# Patient Record
Sex: Female | Born: 1985 | Race: Black or African American | Hispanic: No | Marital: Single | Smoking: Never smoker
Health system: Southern US, Community
[De-identification: ages and names within clinical notes are randomized; demographics above are authoritative.]

## PROBLEM LIST (undated history)

## (undated) ENCOUNTER — Emergency Department (HOSPITAL_COMMUNITY): Admission: EM | Disposition: A | Discharge status: Home / Self Care | Payer: Self-pay

## (undated) DIAGNOSIS — H9319 Tinnitus, unspecified ear: Secondary | ICD-10-CM

## (undated) DIAGNOSIS — M25561 Pain in right knee: Secondary | ICD-10-CM

## (undated) DIAGNOSIS — R519 Headache, unspecified: Secondary | ICD-10-CM

## (undated) DIAGNOSIS — R51 Headache: Secondary | ICD-10-CM

## (undated) DIAGNOSIS — N83209 Unspecified ovarian cyst, unspecified side: Secondary | ICD-10-CM

## (undated) DIAGNOSIS — D259 Leiomyoma of uterus, unspecified: Secondary | ICD-10-CM

## (undated) DIAGNOSIS — R002 Palpitations: Secondary | ICD-10-CM

## (undated) DIAGNOSIS — T7840XA Allergy, unspecified, initial encounter: Secondary | ICD-10-CM

## (undated) DIAGNOSIS — E119 Type 2 diabetes mellitus without complications: Secondary | ICD-10-CM

## (undated) DIAGNOSIS — E739 Lactose intolerance, unspecified: Secondary | ICD-10-CM

## (undated) DIAGNOSIS — J302 Other seasonal allergic rhinitis: Secondary | ICD-10-CM

## (undated) DIAGNOSIS — K219 Gastro-esophageal reflux disease without esophagitis: Secondary | ICD-10-CM

## (undated) DIAGNOSIS — D500 Iron deficiency anemia secondary to blood loss (chronic): Secondary | ICD-10-CM

## (undated) DIAGNOSIS — M26629 Arthralgia of temporomandibular joint, unspecified side: Secondary | ICD-10-CM

## (undated) DIAGNOSIS — E559 Vitamin D deficiency, unspecified: Secondary | ICD-10-CM

## (undated) HISTORY — DX: Lactose intolerance, unspecified: E73.9

## (undated) HISTORY — DX: Tinnitus, unspecified ear: H93.19

## (undated) HISTORY — DX: Headache: R51

## (undated) HISTORY — DX: Allergy, unspecified, initial encounter: T78.40XA

## (undated) HISTORY — DX: Headache, unspecified: R51.9

## (undated) HISTORY — DX: Palpitations: R00.2

## (undated) HISTORY — DX: Pain in right knee: M25.561

## (undated) HISTORY — PX: WISDOM TOOTH EXTRACTION: SHX21

---
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
## 2003-04-26 ENCOUNTER — Inpatient Hospital Stay (HOSPITAL_COMMUNITY): Admission: AD | Admit: 2003-04-26 | Discharge: 2003-04-26 | Payer: Self-pay | Admitting: Obstetrics

## 2003-04-27 ENCOUNTER — Inpatient Hospital Stay (HOSPITAL_COMMUNITY): Admission: AD | Admit: 2003-04-27 | Discharge: 2003-04-29 | Payer: Self-pay | Admitting: Obstetrics

## 2003-05-15 HISTORY — PX: COLONOSCOPY: SHX174

## 2004-08-07 ENCOUNTER — Emergency Department (HOSPITAL_COMMUNITY): Admission: EM | Admit: 2004-08-07 | Discharge: 2004-08-07 | Payer: Self-pay | Admitting: Family Medicine

## 2005-02-10 ENCOUNTER — Inpatient Hospital Stay (HOSPITAL_COMMUNITY): Admission: AD | Admit: 2005-02-10 | Discharge: 2005-02-10 | Payer: Self-pay | Admitting: Obstetrics

## 2005-03-12 ENCOUNTER — Inpatient Hospital Stay (HOSPITAL_COMMUNITY): Admission: AD | Admit: 2005-03-12 | Discharge: 2005-03-16 | Payer: Self-pay | Admitting: Obstetrics

## 2005-03-13 ENCOUNTER — Encounter (INDEPENDENT_AMBULATORY_CARE_PROVIDER_SITE_OTHER): Payer: Self-pay | Admitting: Specialist

## 2005-03-18 ENCOUNTER — Observation Stay (HOSPITAL_COMMUNITY): Admission: AD | Admit: 2005-03-18 | Discharge: 2005-03-19 | Payer: Self-pay | Admitting: Obstetrics

## 2006-08-21 ENCOUNTER — Emergency Department (HOSPITAL_COMMUNITY): Admission: EM | Admit: 2006-08-21 | Discharge: 2006-08-21 | Payer: Self-pay | Admitting: Emergency Medicine

## 2007-03-25 ENCOUNTER — Emergency Department (HOSPITAL_COMMUNITY): Admission: EM | Admit: 2007-03-25 | Discharge: 2007-03-25 | Payer: Self-pay | Admitting: Emergency Medicine

## 2007-07-30 ENCOUNTER — Inpatient Hospital Stay (HOSPITAL_COMMUNITY): Admission: AD | Admit: 2007-07-30 | Discharge: 2007-07-30 | Payer: Self-pay | Admitting: Obstetrics

## 2007-11-10 ENCOUNTER — Inpatient Hospital Stay (HOSPITAL_COMMUNITY): Admission: AD | Admit: 2007-11-10 | Discharge: 2007-11-10 | Payer: Self-pay | Admitting: Obstetrics

## 2007-12-06 ENCOUNTER — Inpatient Hospital Stay (HOSPITAL_COMMUNITY): Admission: AD | Admit: 2007-12-06 | Discharge: 2007-12-06 | Payer: Self-pay | Admitting: Obstetrics

## 2007-12-12 ENCOUNTER — Inpatient Hospital Stay (HOSPITAL_COMMUNITY): Admission: AD | Admit: 2007-12-12 | Discharge: 2007-12-16 | Payer: Self-pay | Admitting: Obstetrics

## 2008-04-06 ENCOUNTER — Inpatient Hospital Stay (HOSPITAL_COMMUNITY): Admission: AD | Admit: 2008-04-06 | Discharge: 2008-04-06 | Payer: Self-pay | Admitting: Obstetrics

## 2008-09-07 ENCOUNTER — Emergency Department (HOSPITAL_COMMUNITY): Admission: EM | Admit: 2008-09-07 | Discharge: 2008-09-07 | Payer: Self-pay | Admitting: Emergency Medicine

## 2008-10-04 ENCOUNTER — Inpatient Hospital Stay (HOSPITAL_COMMUNITY): Admission: AD | Admit: 2008-10-04 | Discharge: 2008-10-04 | Payer: Self-pay | Admitting: Obstetrics

## 2008-10-20 ENCOUNTER — Inpatient Hospital Stay (HOSPITAL_COMMUNITY): Admission: AD | Admit: 2008-10-20 | Discharge: 2008-10-20 | Payer: Self-pay | Admitting: Obstetrics

## 2008-10-27 ENCOUNTER — Inpatient Hospital Stay (HOSPITAL_COMMUNITY): Admission: AD | Admit: 2008-10-27 | Discharge: 2008-10-30 | Payer: Self-pay | Admitting: Obstetrics

## 2009-06-08 ENCOUNTER — Emergency Department (HOSPITAL_COMMUNITY): Admission: EM | Admit: 2009-06-08 | Discharge: 2009-06-08 | Payer: Self-pay | Admitting: Family Medicine

## 2009-09-05 ENCOUNTER — Emergency Department (HOSPITAL_COMMUNITY): Admission: EM | Admit: 2009-09-05 | Discharge: 2009-09-05 | Payer: Self-pay | Admitting: Family Medicine

## 2010-01-29 ENCOUNTER — Emergency Department (HOSPITAL_COMMUNITY): Admission: EM | Admit: 2010-01-29 | Discharge: 2010-01-30 | Payer: Self-pay | Admitting: Emergency Medicine

## 2010-07-30 LAB — POCT RAPID STREP A (OFFICE): Streptococcus, Group A Screen (Direct): NEGATIVE

## 2010-08-21 LAB — CBC
HCT: 19.1 % — ABNORMAL LOW (ref 36.0–46.0)
HCT: 22.6 % — ABNORMAL LOW (ref 36.0–46.0)
Hemoglobin: 5.9 g/dL — CL (ref 12.0–15.0)
Hemoglobin: 7 g/dL — CL (ref 12.0–15.0)
MCHC: 31 g/dL (ref 30.0–36.0)
MCHC: 31.1 g/dL (ref 30.0–36.0)
MCV: 59.8 fL — ABNORMAL LOW (ref 78.0–100.0)
MCV: 60.4 fL — ABNORMAL LOW (ref 78.0–100.0)
Platelets: 192 10*3/uL (ref 150–400)
Platelets: 248 10*3/uL (ref 150–400)
RBC: 3.16 MIL/uL — ABNORMAL LOW (ref 3.87–5.11)
RBC: 3.78 MIL/uL — ABNORMAL LOW (ref 3.87–5.11)
RDW: 19.5 % — ABNORMAL HIGH (ref 11.5–15.5)
RDW: 19.6 % — ABNORMAL HIGH (ref 11.5–15.5)
WBC: 10.2 10*3/uL (ref 4.0–10.5)
WBC: 9.4 10*3/uL (ref 4.0–10.5)

## 2010-08-21 LAB — RPR: RPR Ser Ql: NONREACTIVE

## 2010-08-22 LAB — WET PREP, GENITAL: Clue Cells Wet Prep HPF POC: NONE SEEN

## 2010-09-29 NOTE — Discharge Summary (Signed)
NAME:  Katherine Mcfarland, Katherine Mcfarland NO.:  [id number]   MEDICAL RECORD NO.:  [id number]          PATIENT TYPE:  INP   LOCATION:  9133                          FACILITY:  [facility]   PHYSICIAN:  Kathreen Cosier, M.D.DATE OF BIRTH:  07-29-1985   DATE OF ADMISSION:  03/12/2005  DATE OF DISCHARGE:  03/16/2005                                 DISCHARGE SUMMARY   The patient is a 25 year old gravida 2, para 1-0-0-1, [facility] April 15, 2005,  who was admitted on the night of October 30 with fever at home of greater  than 102.  History of migraines.  She had a severe headache when admitted.  She felt like she was getting an upper respiratory infection.  Her white  count was 8+, hemoglobin 11.  Urinalysis was negative.  She was started on  ampicillin 2 g IV every six hours.  October 31, highest temperature was  100.2.  There was decreased movement of the baby.  She was transferred to  antenatal for continuous of monitoring.  GBS culture was done.  On October  31, highest temperature was 100.8.  She was also started on gentamicin and  then by 9 p.m. she started having fetal tachycardia of 170 to 190.  She was  on O2.  Her cervix was closed.  Ultrasound was normal.  It was decided she  would be delivered.  She had a positive GBS.  Also, it was decided she  should be delivered by C-section, as the cervix was long and closed, and she  would be delivered by a C-section for non-reassuring fetal heart rate.  Her  temperature had been normal throughout the majority of the day.  Using a  spinal, she had a female, Apgars 6 and 8, weighing 5 pounds 14 ounces.  Fluid  was clear.  Team in attendance.  Placenta was sent to pathology.  Postoperatively, she rapidly defervesced and remained afebrile.  She was  discharged home on the third postoperative day on Tylox for pain.  Her  hemoglobin was 9.3.  On admission, hemoglobin was 11, white count 8.4,  postoperative 12.1, platelets 340 and 294.  Negative RPR.   Negative urine.  Positive GBS.   DISCHARGE DIAGNOSIS:  Status post fever of unknown origin and fetal  distress, resulting in primary low-transverse cesarean section.           ______________________________  Kathreen Cosier, M.D.     BAM/MEDQ  D:  04/11/2005  T:  04/11/2005  Job:  [id number]

## 2010-09-29 NOTE — Op Note (Signed)
NAME:  GWENLYN, HOTTINGER NO.:  [id number]   MEDICAL RECORD NO.:  [id number]          PATIENT TYPE:  INP   LOCATION:  9133                          [facility]ILITY[facility]  [facility]   PHYSICIAN:  Kathreen Cosier, M.D.DATE OF BIRTH:  1986-04-25   DATE OF PROCEDURE:  03/13/2005  DATE OF DISCHARGE:                                 OPERATIVE REPORT   PREOPERATIVE DIAGNOSES:  1.  Nonreassuring fetal heart rate.  2.  Maternal fever.   ANESTHESIA:  Spinal.   PROCEDURE:  Patient placed on the operating room table in the supine  position after the spinal administered, abdomen prepped and draped, bladder  emptied with a Foley catheter.  A transverse suprapubic incision made and  carried down to the rectus fascia, fascia cleaned and incised the length of  the incision.  Recti muscles retracted laterally, peritoneum incised  longitudinally.  A transverse incision made in the visceral peritoneum above  the bladder, bladder mobilized inferiorly.  A transverse lower uterine  incision made.  Fluid was clear.  The patient delivered from the LOA  position of a female, Apgars 6 and 8, weighing 5 pounds 14 ounces.  The team  was in attendance.  The placenta was posteriorly and removed manually, the  uterine cavity cleaned with dry laps.  The uterine incision closed in one  layer with continuous suture of #1 chromic.  Hemostasis was satisfactory.  Bladder flap reattached with 2-0 chromic.  The uterus well-contracted, tubes  and ovaries normal.  Abdomen closed in layers, the peritoneum with  continuous suture of 0 chromic, fascia with a continuous suture of 0 Dexon  and the skin closed with subcuticular stitch of 4-0 Monocryl.  Blood loss  600 mL.           ______________________________  Kathreen Cosier, M.D.     BAM/MEDQ  D:  03/13/2005  T:  03/14/2005  Job:  [id number]

## 2010-09-29 NOTE — H&P (Signed)
NAME:  Katherine Mcfarland, Katherine Mcfarland NO.:  [id number]   MEDICAL RECORD NO.:  [id number]          PATIENT TYPE:  INP   LOCATION:  9133                          [facility]ILITY:  [facility]   PHYSICIAN:  Kathreen Cosier, M.D.DATE OF BIRTH:  August 01, 1985   DATE OF ADMISSION:  03/12/2005  DATE OF DISCHARGE:                                HISTORY & PHYSICAL   HISTORY:  The patient is a 25 year old gravida 2, para 1-0-0-1, [facility] April 15, 2005. She was admitted on the night of October 30, complaining of a  temperature at home of 102+ and history of severe headache. She has a  history of migraines.  While in the emergency room, she was afebrile. Then  her temperature went to 100.8.  Her hemoglobin was 11, white count 8000.  Urinalysis negative.  On examination, her abdomen was soft, nontender.  Fetal heart rate was normal.  Cervix was long, closed. There was no history  of leakage of fluid.  The patient __________  and on October 31 GBS was  performed and she was positive.  She had been started on ampicillin 2 g IV  on admission.  Her highest temperature on October 31 was 100.8 at 2 p.m.  She was started on IV gentamicin.  The patient was still asymptomatic and  approximately 9 p.m. on October 31 she developed fetal tachycardia up to 170-  190.  Placed on oxygen and there was no improvement in one hour.  It was  decided that she be delivered.  She was at 35 weeks and two days and an  ultrasound performed at on October 31 did not show any abnormality.  It was  decided she would deliver by cesarean section for nonreaassuring fetal heart  rate tracing.  Prior to the cesarean section, her temperature elevation  recurred to 101.5.  She was given Tylenol and her GBS was positive.   PHYSICAL EXAMINATION:  GENERAL:  Well-developed female in no acute distress.  HEENT:  Negative.  LUNGS:  Clear.  HEART:  Regular rhythm.  No murmurs or gallops.  BREASTS:  No masses.  ABDOMEN:  Thirty-six week size.   Estimated fetal weight by ultrasound 2600  g.  Fetal heart prior to surgery 195.  EXTREMITIES:  Negative.           ______________________________  Kathreen Cosier, M.D.     BAM/MEDQ  D:  03/13/2005  T:  03/14/2005  Job:  [id number]

## 2010-12-12 ENCOUNTER — Emergency Department (HOSPITAL_COMMUNITY)
Admission: EM | Admit: 2010-12-12 | Discharge: 2010-12-13 | Disposition: A | Discharge status: Home / Self Care | Payer: Self-pay | Attending: Emergency Medicine | Admitting: Emergency Medicine

## 2010-12-12 DIAGNOSIS — T63391A Toxic effect of venom of other spider, accidental (unintentional), initial encounter: Secondary | ICD-10-CM | POA: Insufficient documentation

## 2010-12-12 DIAGNOSIS — T6391XA Toxic effect of contact with unspecified venomous animal, accidental (unintentional), initial encounter: Secondary | ICD-10-CM | POA: Insufficient documentation

## 2010-12-12 DIAGNOSIS — T7840XA Allergy, unspecified, initial encounter: Secondary | ICD-10-CM | POA: Insufficient documentation

## 2010-12-12 DIAGNOSIS — H5710 Ocular pain, unspecified eye: Secondary | ICD-10-CM | POA: Insufficient documentation

## 2011-01-31 ENCOUNTER — Emergency Department (HOSPITAL_COMMUNITY)
Admission: EM | Admit: 2011-01-31 | Discharge: 2011-01-31 | Disposition: A | Discharge status: Home / Self Care | Payer: Medicaid Other | Attending: Emergency Medicine | Admitting: Emergency Medicine

## 2011-01-31 DIAGNOSIS — R209 Unspecified disturbances of skin sensation: Secondary | ICD-10-CM | POA: Insufficient documentation

## 2011-01-31 DIAGNOSIS — F410 Panic disorder [episodic paroxysmal anxiety] without agoraphobia: Secondary | ICD-10-CM | POA: Insufficient documentation

## 2011-01-31 DIAGNOSIS — R42 Dizziness and giddiness: Secondary | ICD-10-CM | POA: Insufficient documentation

## 2011-01-31 DIAGNOSIS — R0989 Other specified symptoms and signs involving the circulatory and respiratory systems: Secondary | ICD-10-CM | POA: Insufficient documentation

## 2011-01-31 DIAGNOSIS — R0609 Other forms of dyspnea: Secondary | ICD-10-CM | POA: Insufficient documentation

## 2011-01-31 DIAGNOSIS — R000 Tachycardia, unspecified: Secondary | ICD-10-CM | POA: Insufficient documentation

## 2011-01-31 LAB — URINALYSIS, ROUTINE W REFLEX MICROSCOPIC
Bilirubin Urine: NEGATIVE
Glucose, UA: NEGATIVE mg/dL
Hgb urine dipstick: NEGATIVE
Ketones, ur: NEGATIVE mg/dL
Leukocytes, UA: NEGATIVE
Nitrite: NEGATIVE
Protein, ur: NEGATIVE mg/dL
Specific Gravity, Urine: 1.011 (ref 1.005–1.030)
Urobilinogen, UA: 0.2 mg/dL (ref 0.0–1.0)
pH: 7 (ref 5.0–8.0)

## 2011-01-31 LAB — POCT I-STAT, CHEM 8
BUN: 10 mg/dL (ref 6–23)
Calcium, Ion: 1.23 mmol/L (ref 1.12–1.32)
Chloride: 104 mEq/L (ref 96–112)
Creatinine, Ser: 0.8 mg/dL (ref 0.50–1.10)
Glucose, Bld: 116 mg/dL — ABNORMAL HIGH (ref 70–99)
HCT: 42 % (ref 36.0–46.0)
Hemoglobin: 14.3 g/dL (ref 12.0–15.0)
Potassium: 4.5 mEq/L (ref 3.5–5.1)
Sodium: 136 mEq/L (ref 135–145)
TCO2: 23 mmol/L (ref 0–100)

## 2011-01-31 LAB — POCT PREGNANCY, URINE: Preg Test, Ur: NEGATIVE

## 2011-02-05 LAB — URINALYSIS, ROUTINE W REFLEX MICROSCOPIC
Bilirubin Urine: NEGATIVE
Glucose, UA: NEGATIVE
Hgb urine dipstick: NEGATIVE
Ketones, ur: NEGATIVE
Nitrite: NEGATIVE
Protein, ur: NEGATIVE
Specific Gravity, Urine: 1.025
Urobilinogen, UA: 0.2
pH: 6

## 2011-02-09 LAB — RPR: RPR Ser Ql: NONREACTIVE

## 2011-02-09 LAB — CBC
HCT: 32.4 — ABNORMAL LOW
HCT: 22.2 — ABNORMAL LOW
HCT: 32 — ABNORMAL LOW
Hemoglobin: 10.5 — ABNORMAL LOW
Hemoglobin: 10.3 — ABNORMAL LOW
Hemoglobin: 7.2 — CL
MCHC: 32.3
MCHC: 32.3
MCHC: 32.3
MCV: 77.6 — ABNORMAL LOW
MCV: 77.8 — ABNORMAL LOW
MCV: 79.2
Platelets: 228
Platelets: 210
Platelets: 214
RBC: 4.17
RBC: 2.8 — ABNORMAL LOW
RBC: 4.11
RDW: 15
RDW: 14.7
RDW: 15
WBC: 8
WBC: 19.6 — ABNORMAL HIGH
WBC: 6.8

## 2011-02-09 LAB — URINALYSIS, ROUTINE W REFLEX MICROSCOPIC
Bilirubin Urine: NEGATIVE
Glucose, UA: NEGATIVE
Hgb urine dipstick: NEGATIVE
Ketones, ur: NEGATIVE
Nitrite: NEGATIVE
Protein, ur: NEGATIVE
Specific Gravity, Urine: 1.01
Urobilinogen, UA: 0.2
pH: 6.5

## 2011-02-09 LAB — COMPREHENSIVE METABOLIC PANEL
ALT: 12
AST: 22
Albumin: 3 — ABNORMAL LOW
Alkaline Phosphatase: 170 — ABNORMAL HIGH
BUN: 5 — ABNORMAL LOW
CO2: 21
Calcium: 8.9
Chloride: 106
Creatinine, Ser: 0.74
GFR calc Af Amer: >60
GFR calc non Af Amer: >60
Glucose, Bld: 90
Potassium: 3.4 — ABNORMAL LOW
Sodium: 137
Total Bilirubin: 0.2 — ABNORMAL LOW
Total Protein: 7

## 2011-02-09 LAB — URIC ACID: Uric Acid, Serum: 5.7

## 2011-02-09 LAB — MAGNESIUM: Magnesium: 7

## 2011-02-09 LAB — LACTATE DEHYDROGENASE: LDH: 166

## 2011-02-13 LAB — CBC
HCT: 27.1 — ABNORMAL LOW
Hemoglobin: 8.6 — ABNORMAL LOW
MCHC: 31.6
MCV: 66.1 — ABNORMAL LOW
Platelets: 378
RBC: 4.1
RDW: 25 — ABNORMAL HIGH
WBC: 7.5

## 2011-02-13 LAB — WET PREP, GENITAL: Trich, Wet Prep: NONE SEEN

## 2011-02-13 LAB — URINALYSIS, ROUTINE W REFLEX MICROSCOPIC
Protein, ur: NEGATIVE
Urobilinogen, UA: 0.2

## 2011-02-13 LAB — ABO/RH: ABO/RH(D): B POS

## 2011-02-20 LAB — POCT URINALYSIS DIP (DEVICE)
Hgb urine dipstick: NEGATIVE
Nitrite: NEGATIVE
Urobilinogen, UA: 0.2
pH: 6

## 2011-02-20 LAB — POCT PREGNANCY, URINE: Preg Test, Ur: NEGATIVE

## 2011-12-10 ENCOUNTER — Encounter (HOSPITAL_COMMUNITY): Payer: Self-pay | Admitting: Emergency Medicine

## 2011-12-10 ENCOUNTER — Emergency Department (HOSPITAL_COMMUNITY)
Admission: EM | Admit: 2011-12-10 | Discharge: 2011-12-10 | Disposition: A | Discharge status: Home / Self Care | Payer: Medicaid Other | Attending: Emergency Medicine | Admitting: Emergency Medicine

## 2011-12-10 DIAGNOSIS — S335XXA Sprain of ligaments of lumbar spine, initial encounter: Secondary | ICD-10-CM | POA: Insufficient documentation

## 2011-12-10 DIAGNOSIS — X58XXXA Exposure to other specified factors, initial encounter: Secondary | ICD-10-CM | POA: Insufficient documentation

## 2011-12-10 DIAGNOSIS — S39012A Strain of muscle, fascia and tendon of lower back, initial encounter: Secondary | ICD-10-CM

## 2011-12-10 MED ORDER — CYCLOBENZAPRINE HCL 10 MG PO TABS: 10.0000 mg | ORAL_TABLET | Freq: Two times a day (BID) | ORAL | Status: AC | PRN

## 2011-12-10 MED ORDER — HYDROCODONE-ACETAMINOPHEN 5-500 MG PO TABS: 1.0000 | ORAL_TABLET | Freq: Four times a day (QID) | ORAL | Status: AC | PRN

## 2011-12-10 NOTE — ED Notes (Signed)
Pt presenting to ed with c/o getting out of shower on Saturday and feeling a pop in her back and she states she has had pain since. Pt denies any other injury at this time. Pt denies dysuria

## 2011-12-10 NOTE — ED Provider Notes (Signed)
History     CSN: [id number]  Arrival date & time 12/10/11  1509   None     Chief Complaint  Patient presents with  . Back Pain    (Consider location/radiation/quality/duration/timing/severity/associated sxs/prior treatment) HPI  Pt to the ER with complaints of back pain. She says she got out of the shower on Saturday and heard a pop. She says it has been very sore since then. The pain is left paraspinal. She denies have chronic back pain/ having nausea, vomiting, diarrhea. She denies having bowel or urinary incontinence. The patient is ambulatory. She denies dysuria. She denies IV drug use or recent fevers.  History reviewed. No pertinent past medical history.  History reviewed. No pertinent past surgical history.  No family history on file.  History  Substance Use Topics  . Smoking status: Never Smoker   . Smokeless tobacco: Not on file  . Alcohol Use: No    OB History    Grav Para Term Preterm Abortions TAB SAB Ect Mult Living                  Review of Systems    HEENT: denies blurry vision or change in hearing PULMONARY: Denies difficulty breathing and SOB CARDIAC: denies chest pain or heart palpitations MUSCULOSKELETAL:  denies being unable to ambulate ABDOMEN AL: denies abdominal pain GU: denies loss of bowel or urinary control NEURO: denies numbness and tingling in extremities SKIN: no new rashes PSYCH: patient denies anxiety or depression. NECK: Pt denies having neck pain    Allergies  Review of patient's allergies indicates no known allergies.  Home Medications   Current Outpatient Rx  Name Route Sig Dispense Refill  . CYCLOBENZAPRINE HCL 10 MG PO TABS Oral Take 1 tablet (10 mg total) by mouth 2 (two) times daily as needed for muscle spasms. 20 tablet 0  . HYDROCODONE-ACETAMINOPHEN 5-500 MG PO TABS Oral Take 1 tablet by mouth every 6 (six) hours as needed for pain. 15 tablet 0    BP 124/73  Pulse 81  Temp 99.5 F (37.5 C) (Oral)  Resp 25   SpO2 100%  Physical Exam  Nursing note and vitals reviewed. Constitutional: She appears well-developed and well-nourished. No distress.  HENT:  Head: Normocephalic and atraumatic.  Eyes: Pupils are equal, round, and reactive to light.  Neck: Normal range of motion. Neck supple.  Cardiovascular: Normal rate and regular rhythm.   Pulmonary/Chest: Effort normal.  Abdominal: Soft.  Musculoskeletal:       Lumbar back: She exhibits decreased range of motion, tenderness, swelling, pain and spasm. She exhibits no bony tenderness, no edema, no deformity, no laceration and normal pulse.       Back:        Equal strength to bilateral lower extremities. Neurosensory  function adequate to both legs. Skin color is normal. Skin is warm and moist. I see no step off deformity, no bony tenderness. Pt is able to ambulate without limp. Pain is relieved when sitting in certain positions. ROM is decreased due to pain. No crepitus, laceration, effusion, swelling.  Pulses are normal   Neurological: She is alert.  Skin: Skin is warm and dry.    ED Course  Procedures (including critical care time)  Labs Reviewed - No data to display No results found.   1. Back strain       MDM  Patient with back pain. No neurological deficits. Patient is ambulatory. No warning symptoms of back pain including: loss of bowel or  bladder control, night sweats, waking from sleep with back pain, unexplained fevers or weight loss, h/o cancer, IVDU, recent trauma. No concern for cauda equina, epidural abscess, or other serious cause of back pain. Conservative measures such as rest, ice/heat and pain medicine indicated with PCP follow-up if no improvement with conservative management.           Dorthula Matas, PA 12/10/11 1754

## 2011-12-12 NOTE — ED Provider Notes (Signed)
Medical screening examination/treatment/procedure(s) were performed by non-physician practitioner and as supervising physician I was immediately available for consultation/collaboration.  Vallarie Fei, MD 12/12/11 0006 

## 2012-04-23 ENCOUNTER — Encounter (HOSPITAL_COMMUNITY): Payer: Self-pay | Admitting: Emergency Medicine

## 2012-04-23 ENCOUNTER — Emergency Department (INDEPENDENT_AMBULATORY_CARE_PROVIDER_SITE_OTHER)
Admission: EM | Admit: 2012-04-23 | Discharge: 2012-04-23 | Disposition: A | Discharge status: Home / Self Care | Payer: Self-pay | Source: Home / Self Care | Attending: Emergency Medicine | Admitting: Emergency Medicine

## 2012-04-23 DIAGNOSIS — K0889 Other specified disorders of teeth and supporting structures: Secondary | ICD-10-CM

## 2012-04-23 DIAGNOSIS — K089 Disorder of teeth and supporting structures, unspecified: Secondary | ICD-10-CM

## 2012-04-23 MED ORDER — TRAMADOL HCL 50 MG PO TABS: 50.0000 mg | ORAL_TABLET | Freq: Four times a day (QID) | ORAL | Status: DC | PRN

## 2012-04-23 MED ORDER — PENICILLIN V POTASSIUM 500 MG PO TABS: 500.0000 mg | ORAL_TABLET | Freq: Three times a day (TID) | ORAL | Status: DC

## 2012-04-23 NOTE — ED Provider Notes (Signed)
History     CSN: [id number]  Arrival date & time 04/23/12  1541   First MD Initiated Contact with Patient 04/23/12 1742      Chief Complaint  Patient presents with  . Dental Pain  . Generalized Body Aches    (Consider location/radiation/quality/duration/timing/severity/associated sxs/prior treatment) Patient is a 26 y.o. female presenting with tooth pain. The history is provided by the patient.  Dental PainThe primary symptoms include mouth pain. The symptoms began more than 1 week ago. The symptoms are worsening. The symptoms are new. The symptoms occur frequently.  Affected locations include: gum(s) and teeth. At its highest the mouth pain was at 8/10.  Additional symptoms include: dental sensitivity to temperature, gum swelling, gum tenderness, jaw pain and ear pain. Additional symptoms do not include: purulent gums, trismus, facial swelling, trouble swallowing, pain with swallowing, excessive salivation, dry mouth, taste disturbance, smell disturbance, hearing loss, nosebleeds and swollen glands. Medical issues include: periodontal disease. Medical issues do not include: alcohol problem, smoking, chewing tobacco and cancer.    History reviewed. No pertinent past medical history.  History reviewed. No pertinent past surgical history.  History reviewed. No pertinent family history.  History  Substance Use Topics  . Smoking status: Never Smoker   . Smokeless tobacco: Not on file  . Alcohol Use: No    OB History    Grav Para Term Preterm Abortions TAB SAB Ect Mult Living                  Review of Systems  HENT: Positive for ear pain and dental problem. Negative for hearing loss, nosebleeds, facial swelling and trouble swallowing.     Allergies  Review of patient's allergies indicates no known allergies.  Home Medications   Current Outpatient Rx  Name  Route  Sig  Dispense  Refill  . PENICILLIN V POTASSIUM 500 MG PO TABS   Oral   Take 1 tablet (500 mg total) by  mouth 3 (three) times daily.   21 tablet   0   . TRAMADOL HCL 50 MG PO TABS   Oral   Take 1 tablet (50 mg total) by mouth every 6 (six) hours as needed for pain.   15 tablet   0     BP 133/80  Pulse 98  Temp 99.1 F (37.3 C) (Oral)  Resp 16  SpO2 97%  Physical Exam  Nursing note and vitals reviewed. Constitutional: She is oriented to person, place, and time. Vital signs are normal. She appears well-developed and well-nourished. She is active and cooperative.  HENT:  Head: Normocephalic.  Right Ear: Tympanic membrane and external ear normal.  Left Ear: Tympanic membrane and external ear normal.  Nose: Nose normal. Right sinus exhibits no maxillary sinus tenderness and no frontal sinus tenderness. Left sinus exhibits no maxillary sinus tenderness and no frontal sinus tenderness.  Mouth/Throat: Uvula is midline, oropharynx is clear and moist and mucous membranes are normal.    Eyes: Conjunctivae normal are normal. Pupils are equal, round, and reactive to light. No scleral icterus.  Neck: Trachea normal. Neck supple.  Cardiovascular: Normal rate, regular rhythm, normal heart sounds and intact distal pulses.   Pulmonary/Chest: Effort normal and breath sounds normal.  Neurological: She is alert and oriented to person, place, and time. No cranial nerve deficit or sensory deficit.  Skin: Skin is warm and dry.  Psychiatric: She has a normal mood and affect. Her speech is normal and behavior is normal. Judgment and thought content  normal. Cognition and memory are normal.    ED Course  Procedures (including critical care time)  Labs Reviewed - No data to display No results found.   1. Pain, dental       MDM  Medications as prescribed, follow up with dentist.          Johnsie Kindred, NP 04/23/12 [phone]

## 2012-04-23 NOTE — ED Provider Notes (Signed)
Medical screening examination/treatment/procedure(s) were performed by non-physician practitioner and as supervising physician I was immediately available for consultation/collaboration.  Leslee Home, M.D.   Reuben Likes, MD 04/23/12 2008

## 2012-04-23 NOTE — ED Notes (Signed)
Reports dental pain on left side for two weeks.  Patient states left side of mouth is sensitive.  Admits to nausea but denies vomiting.  Patient states her breathe does have an odor.  C/o body ache and chills which started today.

## 2013-05-05 ENCOUNTER — Encounter (HOSPITAL_COMMUNITY): Payer: Self-pay | Admitting: Emergency Medicine

## 2013-05-05 ENCOUNTER — Emergency Department (HOSPITAL_COMMUNITY): Payer: Self-pay

## 2013-05-05 ENCOUNTER — Emergency Department (HOSPITAL_COMMUNITY)
Admission: EM | Admit: 2013-05-05 | Discharge: 2013-05-05 | Disposition: A | Discharge status: Home / Self Care | Payer: Self-pay | Attending: Emergency Medicine | Admitting: Emergency Medicine

## 2013-05-05 DIAGNOSIS — R197 Diarrhea, unspecified: Secondary | ICD-10-CM | POA: Insufficient documentation

## 2013-05-05 DIAGNOSIS — R509 Fever, unspecified: Secondary | ICD-10-CM

## 2013-05-05 DIAGNOSIS — J069 Acute upper respiratory infection, unspecified: Secondary | ICD-10-CM | POA: Insufficient documentation

## 2013-05-05 DIAGNOSIS — H9209 Otalgia, unspecified ear: Secondary | ICD-10-CM | POA: Insufficient documentation

## 2013-05-05 DIAGNOSIS — IMO0001 Reserved for inherently not codable concepts without codable children: Secondary | ICD-10-CM | POA: Insufficient documentation

## 2013-05-05 MED ORDER — IBUPROFEN 400 MG PO TABS
800.0000 mg | ORAL_TABLET | Freq: Once | ORAL | Status: AC
  Administered 2013-05-05: 800 mg via ORAL
  Filled 2013-05-05: qty 2

## 2013-05-05 NOTE — ED Notes (Signed)
Patient states fever, chills, intermittent diarrhea, with productive cough x 4 days

## 2013-05-05 NOTE — ED Provider Notes (Signed)
Medical screening examination/treatment/procedure(s) were performed by non-physician practitioner and as supervising physician I was immediately available for consultation/collaboration.  EKG Interpretation   None         Audree Camel, MD 05/05/13 [phone]

## 2013-05-05 NOTE — ED Notes (Signed)
Patient returning from xray at this time, patient in NAD

## 2013-05-05 NOTE — ED Provider Notes (Signed)
CSN: [id number]     Arrival date & time 05/05/13  1191 History  This chart was scribed for Katherine Sites, PA, working with Audree Camel, MD, by [facility] ED Scribe. This patient was seen in room TR08C/TR08C and the patient's care was started at 9:36 AM.   Chief Complaint  Patient presents with  . URI  . Fever    The history is provided by the patient. No language interpreter was used.   HPI Comments: Katherine Mcfarland is a 27 y.o. female who presents to the Emergency Department complaining of a productive cough over the past 4 days. She also reports associated intermittent fever and chills, myalgias, episodes of watery diarrhea, bilateral ear pain and sinus pressure over the past 4 days. No hematochezia.  She denies known sick contacts but states her 2 young children also began coughing yesterday. She denies emesis or abdominal pain.  Limited solid PO intake due to decreased appetite but has been drinking fluids regularly.  History reviewed. No pertinent past medical history. Past Surgical History  Procedure Laterality Date  . Cesarean section     No family history on file.  History  Substance Use Topics  . Smoking status: Never Smoker   . Smokeless tobacco: Not on file  . Alcohol Use: No   OB History   Grav Para Term Preterm Abortions TAB SAB Ect Mult Living                 Review of Systems  Constitutional: Positive for fever and chills.  HENT: Positive for ear pain (bilateral) and sinus pressure.   Respiratory: Positive for cough.   Gastrointestinal: Positive for diarrhea. Negative for vomiting.  Musculoskeletal: Positive for myalgias (generalized).  All other systems reviewed and are negative.   Allergies  Review of patient's allergies indicates no known allergies.  Home Medications   Current Outpatient Rx  Name  Route  Sig  Dispense  Refill  . acetaminophen (TYLENOL) 500 MG tablet   Oral   Take 1,000 mg by mouth every 6 (six) hours as needed for mild pain  or fever.         Marland Kitchen ibuprofen (ADVIL,MOTRIN) 200 MG tablet   Oral   Take 800 mg by mouth every 6 (six) hours as needed for fever or moderate pain.         . Pseudoeph-Doxylamine-DM-APAP (NYQUIL PO)   Oral   Take 30 mLs by mouth daily as needed (for cough).          Triage Vitals: BP 115/80  Pulse 120  Temp(Src) 100.4 F (38 C) (Oral)  Resp 20  Ht 5\' 6"  (1.676 m)  Wt 220 lb (99.791 kg)  BMI 35.53 kg/m2  SpO2 97%  Physical Exam  Nursing note and vitals reviewed. Constitutional: She is oriented to person, place, and time. She appears well-developed and well-nourished. No distress.  HENT:  Head: Normocephalic and atraumatic.  Right Ear: Tympanic membrane and ear canal normal.  Left Ear: Tympanic membrane and ear canal normal.  Nose: Nose normal.  Mouth/Throat: Uvula is midline, oropharynx is clear and moist and mucous membranes are normal. No trismus in the jaw. No uvula swelling. No oropharyngeal exudate, posterior oropharyngeal edema, posterior oropharyngeal erythema or tonsillar abscesses.  HEENT WNL  Eyes: Conjunctivae and EOM are normal. Pupils are equal, round, and reactive to light.  Neck: Normal range of motion.  Cardiovascular: Normal rate, regular rhythm and normal heart sounds.   Pulmonary/Chest: Effort normal and breath  sounds normal. No respiratory distress. She has no wheezes.  Normal work of breathing without accessory muscle use or signs of respiratory distress; lungs CTAB  Abdominal: Soft. Bowel sounds are normal. There is no tenderness. There is no guarding.  Abdomen soft, non-distended, no peritoneal signs  Musculoskeletal: Normal range of motion.  Neurological: She is alert and oriented to person, place, and time.  Skin: Skin is warm and dry. She is not diaphoretic.  Psychiatric: She has a normal mood and affect.    ED Course  Procedures (including critical care time)  DIAGNOSTIC STUDIES: Oxygen Saturation is 97% on RA, normal by my  interpretation.    COORDINATION OF CARE: 9:41 AM- Discussed plan to obtain a CXR to rule out pneumonia. Will order Motrin. Pt advised of plan for treatment and pt agrees.  Medications  ibuprofen (ADVIL,MOTRIN) tablet 800 mg (800 mg Oral Given 05/05/13 0940)   Labs Review Labs Reviewed - No data to display Imaging Review Dg Chest 2 View  05/05/2013   CLINICAL DATA:  Fever and productive cough.  EXAM: CHEST  2 VIEW  COMPARISON:  None.  FINDINGS: The heart size and mediastinal contours are within normal limits. Both lungs are clear. The visualized skeletal structures are unremarkable.  IMPRESSION: Normal chest radiographs.   Electronically Signed   By: Amie Portland M.D.   On: 05/05/2013 10:07    EKG Interpretation   None       MDM   1. Viral URI with cough   2. Fever    Chest x-ray negative for acute findings. Constellation of symptoms likely due to a viral URI. Pt appears well hydrated, do not feel that IVF are indicated at this time.  Patient instructed on supportive care including over-the-counter cold and cough medications. Continue drinking plenty of fluids to keep hydrated, take motrin/tylenol PRN fever at home.  [facility]up [facility] if no improvement in next few days. Discussed plan with patient and she agreed. Return precautions advised.  I personally performed the services described in this documentation, which was scribed in my presence. The recorded information has been reviewed and is accurate.  Garlon Hatchet, PA-C 05/05/13 1120  Garlon Hatchet, PA-C 05/05/13 1120

## 2013-10-19 ENCOUNTER — Telehealth (HOSPITAL_COMMUNITY): Payer: Self-pay | Admitting: *Deleted

## 2013-10-19 NOTE — Telephone Encounter (Signed)
Telephoned patient at home # and left message to return call to [facility] 

## 2013-10-30 ENCOUNTER — Encounter (HOSPITAL_COMMUNITY): Payer: Self-pay

## 2013-11-03 ENCOUNTER — Ambulatory Visit (HOSPITAL_COMMUNITY)
Admission: RE | Admit: 2013-11-03 | Discharge: 2013-11-03 | Disposition: A | Discharge status: Home / Self Care | Payer: Self-pay | Source: Ambulatory Visit | Attending: Obstetrics and Gynecology | Admitting: Obstetrics and Gynecology

## 2013-11-03 ENCOUNTER — Encounter (HOSPITAL_COMMUNITY): Payer: Self-pay

## 2013-11-03 VITALS — BP 114/72 | Temp 98.7°F | Ht 66.0 in | Wt 215.4 lb

## 2013-11-03 DIAGNOSIS — Z1239 Encounter for other screening for malignant neoplasm of breast: Secondary | ICD-10-CM

## 2013-11-03 DIAGNOSIS — R87810 Cervical high risk human papillomavirus (HPV) DNA test positive: Secondary | ICD-10-CM

## 2013-11-03 DIAGNOSIS — R87610 Atypical squamous cells of undetermined significance on cytologic smear of cervix (ASC-US): Secondary | ICD-10-CM

## 2013-11-03 NOTE — Addendum Note (Signed)
Encounter addended by: Shirley Muscat, RN on: 11/03/2013  3:00 PM<BR>     Documentation filed: Patient Instructions Section

## 2013-11-03 NOTE — Patient Instructions (Addendum)
Taught Katherine Mcfarland how to perform BSE and gave educational materials to take home. Let patient know that she will need a screening mammogram starting at age 30 unless clinically indicated prior. Patient did not need a Pap smear today due to last Pap smear was 09/02/2013. Referred patient to the [facility] for a colposcopy per recommendation to follow-up for abnormal Pap smear. Appointment scheduled for Monday, November 09, 2013 at 1530. Patient aware of appointment and will be there. Katherine Mcfarland verbalized understanding.  Katherine Mcfarland, Arvil Chaco, RN 1:13 PM

## 2013-11-03 NOTE — Progress Notes (Signed)
Patient referred to [facility] by the [facility] to follow-up for an abnormal Pap smear on 09/02/2013.  Pap Smear:  Pap smear not completed today. Last Pap smear was 09/02/2013 at the [facility] Department and ASCUS HPV+. Per patient has a history of an abnormal Pap smear around 10 years ago that required a colposcopy for follow-up. Per patient all Pap smears have been normal since colposcopy. Pap smear result are scanned in EPIC under media.  Physical exam: Breasts Breasts symmetrical. No skin abnormalities bilateral breasts. No nipple retraction bilateral breasts. No nipple discharge bilateral breasts. No lymphadenopathy. No lumps palpated bilateral breasts. No complaints of pain or tenderness on exam. Screening mammogram recommended at age 30 unless clinically indicated prior.       Pelvic/Bimanual No Pap smear completed today since last Pap smear was 09/02/2013. Pap smear not indicated per [facility] guidelines.

## 2013-11-09 ENCOUNTER — Encounter: Payer: Self-pay | Admitting: Family Medicine

## 2013-11-09 ENCOUNTER — Other Ambulatory Visit (HOSPITAL_COMMUNITY)
Admission: RE | Admit: 2013-11-09 | Discharge: 2013-11-09 | Disposition: A | Discharge status: Home / Self Care | Payer: Self-pay | Source: Ambulatory Visit | Attending: Family Medicine | Admitting: Family Medicine

## 2013-11-09 ENCOUNTER — Ambulatory Visit (INDEPENDENT_AMBULATORY_CARE_PROVIDER_SITE_OTHER): Payer: Self-pay | Admitting: Family Medicine

## 2013-11-09 VITALS — BP 129/82 | HR 87 | Temp 98.7°F | Ht 67.0 in | Wt 213.8 lb

## 2013-11-09 DIAGNOSIS — Z01812 Encounter for preprocedural laboratory examination: Secondary | ICD-10-CM

## 2013-11-09 DIAGNOSIS — R87610 Atypical squamous cells of undetermined significance on cytologic smear of cervix (ASC-US): Secondary | ICD-10-CM

## 2013-11-09 DIAGNOSIS — R87810 Cervical high risk human papillomavirus (HPV) DNA test positive: Secondary | ICD-10-CM

## 2013-11-09 LAB — POCT PREGNANCY, URINE: PREG TEST UR: NEGATIVE

## 2013-11-09 NOTE — Progress Notes (Signed)
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  Hx of ASCUS 10 years ago with colposcopy no biopsies Normal paps since.   No tobacco approx 6 lifetime partners.  Currently sexually active.  O:  Filed Vitals:   11/09/13 1558  BP: 129/82  Pulse: 87  Temp: 98.7 F (37.1 C)  TempSrc: Oral  Height: 5\' 7"  (1.702 m)  Weight: 213 lb 12.8 oz (96.979 kg)   GEN:NAD ABD: soft, NT GU: NEFG, normal vagina and cervix.  Uterus normal size.   A/P   ASCUS with HPV  Patient given informed consent, signed copy in the chart, time out was performed.  Placed in lithotomy position. Cervix viewed with speculum and colposcope after application of acetic acid.   Colposcopy adequate?  yes Acetowhite lesions?at 3 and 5 oclock Punctation?at 5 o'clock Mosaicism?  no Abnormal vasculature?  no Biopsies?yes at 3 and 5 ECC?yes   COMMENTS:nabothian cysts at 9 and 11 o'clock  Patient was given post procedure instructions.  She will return in 2 weeks for results. 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     BECK, Eugenia Pancoast, MD

## 2013-11-09 NOTE — Patient Instructions (Signed)

## 2013-11-23 ENCOUNTER — Telehealth: Payer: Self-pay

## 2013-11-23 NOTE — Telephone Encounter (Signed)
Patient returned call. Informed of results and recommendations. Advised she call 2 months prior to being due for pap to schedule appointment. Patient verbalized understanding. No questions or concerns.

## 2013-11-23 NOTE — Telephone Encounter (Signed)
Attempted to call patient. No answer. Left message stating we are calling with results, please call clinic.

## 2013-11-23 NOTE — Telephone Encounter (Signed)
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 No treatment needed now. She needs repeat cotesting in 1 year.  Thanks! ------

## 2014-01-05 ENCOUNTER — Encounter: Payer: Self-pay | Admitting: General Practice

## 2014-03-15 ENCOUNTER — Encounter: Payer: Self-pay | Admitting: Family Medicine

## 2014-10-20 ENCOUNTER — Encounter (HOSPITAL_COMMUNITY): Payer: Self-pay | Admitting: *Deleted

## 2014-10-20 ENCOUNTER — Emergency Department (HOSPITAL_COMMUNITY)
Admission: EM | Admit: 2014-10-20 | Discharge: 2014-10-20 | Disposition: A | Discharge status: Home / Self Care | Payer: Self-pay | Attending: Emergency Medicine | Admitting: Emergency Medicine

## 2014-10-20 DIAGNOSIS — R519 Headache, unspecified: Secondary | ICD-10-CM

## 2014-10-20 DIAGNOSIS — Z3202 Encounter for pregnancy test, result negative: Secondary | ICD-10-CM | POA: Insufficient documentation

## 2014-10-20 DIAGNOSIS — R51 Headache: Secondary | ICD-10-CM

## 2014-10-20 DIAGNOSIS — G43909 Migraine, unspecified, not intractable, without status migrainosus: Secondary | ICD-10-CM | POA: Insufficient documentation

## 2014-10-20 DIAGNOSIS — M25511 Pain in right shoulder: Secondary | ICD-10-CM | POA: Insufficient documentation

## 2014-10-20 LAB — POC URINE PREG, ED: Preg Test, Ur: NEGATIVE

## 2014-10-20 MED ORDER — KETOROLAC TROMETHAMINE 30 MG/ML IJ SOLN
30.0000 mg | Freq: Once | INTRAMUSCULAR | Status: DC
  Filled 2014-10-20: qty 1

## 2014-10-20 MED ORDER — PROCHLORPERAZINE MALEATE 10 MG PO TABS
10.0000 mg | ORAL_TABLET | Freq: Once | ORAL | Status: AC
  Administered 2014-10-20: 10 mg via ORAL
  Filled 2014-10-20: qty 1

## 2014-10-20 MED ORDER — KETOROLAC TROMETHAMINE 30 MG/ML IJ SOLN
30.0000 mg | Freq: Once | INTRAMUSCULAR | Status: AC
  Administered 2014-10-20: 30 mg via INTRAMUSCULAR

## 2014-10-20 MED ORDER — ACETAMINOPHEN 325 MG PO TABS
650.0000 mg | ORAL_TABLET | Freq: Once | ORAL | Status: AC
  Administered 2014-10-20: 650 mg via ORAL
  Filled 2014-10-20: qty 2

## 2014-10-20 MED ORDER — DIPHENHYDRAMINE HCL 25 MG PO CAPS
25.0000 mg | ORAL_CAPSULE | Freq: Once | ORAL | Status: AC
  Administered 2014-10-20: 25 mg via ORAL
  Filled 2014-10-20: qty 1

## 2014-10-20 NOTE — ED Notes (Signed)
Pt sts she has been taking Tylenol Tension Headache, which was working for the pain early on, but is not relieving it now (nor the past two days)

## 2014-10-20 NOTE — ED Provider Notes (Signed)
CSN: [id number]     Arrival date & time 10/20/14  1454 History  This chart was scribed for Lenn Sink, PA-C working with No att. providers found by Mercy Moore, ED Scribe. This patient was seen in room TR03C/TR03C and the patient's care was started at 3:52 PM.   Chief Complaint  Patient presents with  . Migraine   HPI HPI Comments: Katherine Mcfarland is a 29 y.o. female with history of migraines who presents to the Emergency Department complaining of gradual onset migraine headache enduring for six days now. Patient reports right sided, throbbing headache with associated right eye pain and watering and more recent development of right ear, neck and jaw pain. 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 Patient reports nausea, and photophobia; symptoms which are consistent with her migraines. Patient has attempted treatment with Aleve, Excedrin, and Tylenol. Patient reports historical relief of her headaches with these medications but states they have not provided her relief over the past two days. Patient denies fever, vomiting or focal neurological deficits, trauma.  Patient denies alcohol, drug or tobacco use or history of trauma to her head. Patient has never been evaluated by a neurologist, but she has been previously treated for her headaches. She is unable to recall specific treatment.  Patient does not have PCP.    History reviewed. No pertinent past medical history. Past Surgical History  Procedure Laterality Date  . Cesarean section    . Wisdom tooth extraction     Family History  Problem Relation Age of Onset  . Diabetes Maternal Grandmother   . Hypertension Maternal Grandmother    History  Substance Use Topics  . Smoking status: Never Smoker   . Smokeless tobacco: Never Used  . Alcohol Use: Yes     Comment: social   OB History    Gravida Para Term Preterm AB TAB SAB Ectopic Multiple Living   4 4 4       4      Review of  Systems  All other systems reviewed and are negative.   Allergies  Review of patient's allergies indicates no known allergies.  Home Medications   Prior to Admission medications   Medication Sig Start Date End Date Taking? Authorizing Provider  aspirin-acetaminophen-caffeine (EXCEDRIN MIGRAINE) [phone] MG per tablet Take 2 tablets by mouth every 6 (six) hours as needed for headache or migraine.   Yes Historical Provider, MD   Triage Vitals: BP 139/83 mmHg  Pulse 83  Temp(Src) 98.8 F (37.1 C) (Oral)  Resp 16  Ht 5\' 6"  (1.676 m)  Wt 230 lb (104.327 kg)  BMI 37.14 kg/m2  SpO2 100% Physical Exam  Constitutional: She is oriented to person, place, and time. She appears well-developed and well-nourished. No distress.  HENT:  Head: Normocephalic and atraumatic.  Right Ear: Hearing and tympanic membrane normal. No swelling.  Left Ear: Hearing, tympanic membrane, external ear and ear canal normal. No swelling.  Mouth/Throat: Uvula is midline, oropharynx is clear and moist and mucous membranes are normal. No oropharyngeal exudate, posterior oropharyngeal edema, posterior oropharyngeal erythema or tonsillar abscesses.  Eyes: Conjunctivae and EOM are normal. Pupils are equal, round, and reactive to light.  Neck: Neck supple. No tracheal deviation present.  Cardiovascular: Normal rate and regular rhythm.   Pulmonary/Chest: Effort normal and breath sounds normal. No respiratory distress.  Musculoskeletal: Normal range of motion.  Tenderness to right trapezius and base of skull.   Neurological: She is alert and  oriented to person, place, and time. She has normal strength. No cranial nerve deficit or sensory deficit. She displays a negative Romberg sign. Coordination and gait normal. GCS eye subscore is 4. GCS verbal subscore is 5. GCS motor subscore is 6.  Reflex Scores:      Patellar reflexes are 2+ on the right side and 2+ on the left side. Decreased sensation to right cheek. Remainder of  face intact.   Skin: Skin is warm and dry.  Psychiatric: She has a normal mood and affect. Her behavior is normal.  Nursing note and vitals reviewed.   ED Course  Procedures (including critical care time)  COORDINATION OF CARE: 4:02 PM- Discussed treatment plan with patient at bedside and patient agreed to plan.   Labs Review Labs Reviewed  POC URINE PREG, ED    Imaging Review No results found.   EKG Interpretation None      MDM   Final diagnoses:  Headache, unspecified headache type    Labs: None  Imaging: None  Consults: None  Therapeutics: Tylenol, Toradol, Benadryl, Compazine  Assessment: Headache  Plan: Patient presents with a headache similar to previous. She has no red flags, pain reduced to 2 out of 10. Patient given strict return precautions the event new or worsening signs or symptoms present, she verbalized understanding and agreement for today's plan. Patient instructed to follow-up with [place] and wellness for further evaluation and management if headaches continue to reoccur.    I personally performed the services described in this documentation, which was scribed in my presence. The recorded information has been reviewed and is accurate.   Okey Regal, PA-C 10/20/14 1958  Leonard Schwartz, MD 10/21/14 [phone]

## 2014-10-20 NOTE — ED Notes (Signed)
Pt sts pain is beginning to come back.  Sts this is how her headache has been all week.  Sts she can get it to go away, but it comes back shortly after.  PA made aware.

## 2014-10-20 NOTE — ED Notes (Signed)
Pt reports headache since last Thursday, hx of migraines. Reports nausea.

## 2014-10-20 NOTE — Discharge Instructions (Signed)
Please monitor for new or worsening signs or symptoms, return immediately if any present. Please seek care at [facility] for evaluation of headaches continue to return. Ibuprofen or Tylenol as needed for pain.

## 2014-10-26 ENCOUNTER — Ambulatory Visit: Payer: Self-pay | Attending: Family Medicine | Admitting: Family Medicine

## 2014-10-26 ENCOUNTER — Encounter: Payer: Self-pay | Admitting: Family Medicine

## 2014-10-26 VITALS — BP 131/83 | HR 80 | Temp 98.8°F | Resp 18 | Ht 67.0 in | Wt 238.4 lb

## 2014-10-26 DIAGNOSIS — Z0000 Encounter for general adult medical examination without abnormal findings: Secondary | ICD-10-CM

## 2014-10-26 DIAGNOSIS — G43909 Migraine, unspecified, not intractable, without status migrainosus: Secondary | ICD-10-CM | POA: Insufficient documentation

## 2014-10-26 DIAGNOSIS — G43009 Migraine without aura, not intractable, without status migrainosus: Secondary | ICD-10-CM

## 2014-10-26 LAB — CBC WITH DIFFERENTIAL/PLATELET
Basophils Absolute: 0 10*3/uL (ref 0.0–0.1)
Basophils Relative: 0 % (ref 0–1)
EOS PCT: 6 % — AB (ref 0–5)
Eosinophils Absolute: 0.3 10*3/uL (ref 0.0–0.7)
HCT: 37.4 % (ref 36.0–46.0)
Hemoglobin: 12 g/dL (ref 12.0–15.0)
LYMPHS ABS: 2.5 10*3/uL (ref 0.7–4.0)
LYMPHS PCT: 43 % (ref 12–46)
MCH: 27 pg (ref 26.0–34.0)
MCHC: 32.1 g/dL (ref 30.0–36.0)
MCV: 84.2 fL (ref 78.0–100.0)
MONOS PCT: 7 % (ref 3–12)
MPV: 10 fL (ref 8.6–12.4)
Monocytes Absolute: 0.4 10*3/uL (ref 0.1–1.0)
NEUTROS PCT: 44 % (ref 43–77)
Neutro Abs: 2.5 10*3/uL (ref 1.7–7.7)
Platelets: 290 10*3/uL (ref 150–400)
RBC: 4.44 MIL/uL (ref 3.87–5.11)
RDW: 13.7 % (ref 11.5–15.5)
WBC: 5.7 10*3/uL (ref 4.0–10.5)

## 2014-10-26 LAB — COMPLETE METABOLIC PANEL WITH GFR
ALT: 20 U/L (ref 0–35)
AST: 19 U/L (ref 0–37)
Albumin: 3.9 g/dL (ref 3.5–5.2)
Alkaline Phosphatase: 58 U/L (ref 39–117)
BILIRUBIN TOTAL: 0.2 mg/dL (ref 0.2–1.2)
BUN: 16 mg/dL (ref 6–23)
CALCIUM: 9.1 mg/dL (ref 8.4–10.5)
CHLORIDE: 104 meq/L (ref 96–112)
CO2: 25 meq/L (ref 19–32)
Creat: 0.86 mg/dL (ref 0.50–1.10)
Glucose, Bld: 95 mg/dL (ref 70–99)
Potassium: 4.5 mEq/L (ref 3.5–5.3)
SODIUM: 136 meq/L (ref 135–145)
TOTAL PROTEIN: 6.8 g/dL (ref 6.0–8.3)

## 2014-10-26 LAB — LIPID PANEL
CHOLESTEROL: 158 mg/dL (ref 0–200)
HDL: 38 mg/dL — AB (ref >=46)
LDL Cholesterol: 100 mg/dL — ABNORMAL HIGH (ref 0–99)
TRIGLYCERIDES: 98 mg/dL (ref <150)
Total CHOL/HDL Ratio: 4.2 Ratio
VLDL: 20 mg/dL (ref 0–40)

## 2014-10-26 MED ORDER — SUMATRIPTAN SUCCINATE 100 MG PO TABS: ORAL_TABLET | ORAL | Status: DC

## 2014-10-26 NOTE — Progress Notes (Signed)
Patient ID: Katherine Mcfarland, female   DOB: 05/10/1986, 29 y.o.   MRN: [id number]   Katherine Mcfarland, is a 29 y.o. female  BDZ:[id number]  TMH:[id number]  DOB - 1986-01-10  CC:  Chief Complaint  Patient presents with  . Headache       HPI: Katherine Mcfarland is a 29 y.o. female here today to establish medical care.Patient presents following an ED visit for headache on 6/8. She has a history of headaches as a teenager but is unsure of how they were treated. She reports this episode has be going on for two weeks intermittently. She reports light sensitivity and some relief when she rest in a dark room. She reports as being throbbing in the right temporal area with some discomfort in the right eye and ear. She reports some intermittent nausea but no vomiting. She has not gotten complete relief with her usual OTC medications. She reports it gets better for an hour or two but then returns. She denies other chronic illnesses.  No Known Allergies No past medical history on file. Current Outpatient Prescriptions on File Prior to Visit  Medication Sig Dispense Refill  . aspirin-acetaminophen-caffeine (EXCEDRIN MIGRAINE) 250-250-65 MG per tablet Take 2 tablets by mouth every 6 (six) hours as needed for headache or migraine.     No current facility-administered medications on file prior to visit.   Family History  Problem Relation Age of Onset  . Diabetes Maternal Grandmother   . Hypertension Maternal Grandmother    History   Social History  . Marital Status: Single    Spouse Name: N/A  . Number of Children: N/A  . Years of Education: N/A   Occupational History  . Not on file.   Social History Main Topics  . Smoking status: Never Smoker   . Smokeless tobacco: Never Used  . Alcohol Use: Yes     Comment: social  . Drug Use: No  . Sexual Activity: Yes    Birth Control/ Protection: IUD   Other Topics Concern  . Not on file   Social History Narrative    Review of  Systems: Constitutional: Negative for fever, chills, appetite change, weight loss,  fatigue. HENT: Positive  for ear pain Negative for ear discharge.nose bleeds Eyes: Positive for eye discomfort Negative for discharge, redness, itching and visual disturbance. Neck: Positive  For mild pain,stiffness Respiratory: Negative for cough, shortness of breath,   Cardiovascular: Negative for chest pain, palpitations and leg swelling. Gastrointestinal: Negative for abdominal distention, abdominal pain, nausea, vomiting, diarrhea, constipations Genitourinary: Negative for dysuria, urgency, frequency, hematuria, flank pain,  Musculoskeletal: Negative for back pain, joint pain, joint  swelling, arthralgia and gait problem.Negative for weakness. Neurological: Negative for dizziness, tremors, seizures, syncope, numbness. Positive for lightheadedness related to the headaches.  Hematological: Negative for easy bruising or bleeding Psychiatric/Behavioral: Negative for depression, anxiety, decreased concentration, confusion   Objective:   Filed Vitals:   10/26/14 1437  BP: 131/83  Pulse: 80  Temp: 98.8 F (37.1 C)  Resp: 18    Physical Exam: Constitutional: Patient appears well-developed and well-nourished. No distress. HENT: Normocephalic, atraumatic, External right and left ear normal. Oropharynx is clear and moist.  Eyes: Conjunctivae and EOM are normal. PERRLA, no scleral icterus. Neck: Normal ROM. Neck supple. No lymphadenopathy, No thyromegaly. CVS: RRR, S1/S2 +, no murmurs, no gallops, no rubs Pulmonary: Effort and breath sounds normal, no stridor, rhonchi, wheezes, rales.  Abdominal: Soft. Normoactive BS,, no distension, tenderness, rebound or guarding.  Musculoskeletal: Normal range of  motion. No edema and no tenderness.  Neuro: Alert.Normal muscle tone coordination. Non-focal. Cranial nerves II - XII intact, grips strong and equal, no arm drift, gait normal, strength normal  bilaterally. Skin: Skin is warm and dry. No rash noted. Not diaphoretic. No erythema. No pallor. Psychiatric: Normal mood and affect. Behavior, judgment, thought content normal.  Lab Results  Component Value Date   WBC 10.2 10/29/2008   HGB 14.3 01/31/2011   HCT 42.0 01/31/2011   MCV 60.4* 10/29/2008   PLT 192 10/29/2008   Lab Results  Component Value Date   CREATININE 0.80 01/31/2011   BUN 10 01/31/2011   NA 136 01/31/2011   K 4.5 01/31/2011   CL 104 01/31/2011   CO2 21 12/12/2007    No results found for: HGBA1C Lipid Panel  No results found for: CHOL, TRIG, HDL, CHOLHDL, VLDL, LDLCALC     Assessment and plan:   1. Healthcare maintenance  - COMPLETE METABOLIC PANEL WITH GFR - CBC with Differential - Lipid Panel  2. Migraine headache - a trial of Imitrex 100 mg, #10, one at onset of headache. May repeat in 2 hours. No more than 2 in 24 hours -Follow-up with me on Monday of next week to review how Imitrex is working -Follow-up with assigned PCP in one month.   Return in about 4 weeks (around 11/23/2014).  The patient was given clear instructions to go to ER or return to medical center if symptoms don't improve, worsen or new problems develop. The patient verbalized understanding. The patient was told to call to get lab results if they haven't heard anything in the next week.     Micheline Chapman, FNP-BC       Micheline Chapman, MSN, FNP-BC [place], [place]   10/26/2014, 3:13 PM

## 2014-10-26 NOTE — Patient Instructions (Addendum)
1. Take Imitrex at onset of next headache. May repeat in 2 hours. No more than 2 pills in 24 hours.  2.  Follow-up with me on Monday to see how this is working. 3.  Make an appointment with assigned primary provider here for one month. 4. Make an appointment with assigned primary provider here for one month.

## 2014-10-26 NOTE — Progress Notes (Signed)
C/o headache for two weeks Does have hx of Migraines States she used OTC meds as tx States she went to the ER on last Thursday States eyes will get sensitive to light

## 2014-10-29 ENCOUNTER — Telehealth: Payer: Self-pay

## 2014-10-29 NOTE — Telephone Encounter (Signed)
Nurse called patient, someone answered explaining patient is not home at this time, person requested nurse to return call in about 15 minutes.

## 2014-10-29 NOTE — Telephone Encounter (Signed)
-----   Message from Micheline Chapman, NP sent at 10/29/2014  9:20 AM EDT ----- Bloodwork all within acceptable limits at this time.

## 2014-11-01 ENCOUNTER — Ambulatory Visit: Payer: Self-pay | Admitting: Family Medicine

## 2014-11-01 NOTE — Telephone Encounter (Signed)
Nurse called patient, reached voicemail. Left message for patient to call Eliel Dudding at [phone].   

## 2014-11-01 NOTE — Telephone Encounter (Signed)
-----   Message from Micheline Chapman, NP sent at 10/29/2014  9:20 AM EDT ----- Bloodwork all within acceptable limits at this time.

## 2014-11-02 NOTE — Telephone Encounter (Signed)
-----   Message from Micheline Chapman, NP sent at 10/29/2014  9:20 AM EDT ----- Bloodwork all within acceptable limits at this time.

## 2014-11-02 NOTE — Telephone Encounter (Signed)
Nurse called patient, patient verified date of birth. Patient aware of blood work being in acceptable limits at this time. Patient voices understanding and has no questions at this time.

## 2014-11-08 ENCOUNTER — Ambulatory Visit: Payer: Self-pay | Admitting: Family Medicine

## 2015-05-15 DIAGNOSIS — I471 Supraventricular tachycardia, unspecified: Secondary | ICD-10-CM

## 2015-05-15 HISTORY — DX: Supraventricular tachycardia, unspecified: I47.10

## 2015-06-13 ENCOUNTER — Encounter (HOSPITAL_COMMUNITY): Payer: Self-pay | Admitting: Emergency Medicine

## 2015-06-13 ENCOUNTER — Emergency Department (INDEPENDENT_AMBULATORY_CARE_PROVIDER_SITE_OTHER)
Admission: EM | Admit: 2015-06-13 | Discharge: 2015-06-13 | Disposition: A | Discharge status: Home / Self Care | Payer: Self-pay | Source: Home / Self Care

## 2015-06-13 DIAGNOSIS — H6591 Unspecified nonsuppurative otitis media, right ear: Secondary | ICD-10-CM

## 2015-06-13 DIAGNOSIS — R42 Dizziness and giddiness: Secondary | ICD-10-CM

## 2015-06-13 NOTE — Discharge Instructions (Signed)
Dizziness Dizziness is a common problem. It makes you feel unsteady or lightheaded. You may feel like you are about to pass out (faint). Dizziness can lead to injury if you stumble or fall. Anyone can get dizzy, but dizziness is more common in older adults. This condition can be caused by a number of things, including:  Medicines.  Dehydration.  Illness. HOME CARE Following these instructions may help with your condition: Eating and Drinking  Drink enough fluid to keep your pee (urine) clear or pale yellow. This helps to keep you from getting dehydrated. Try to drink more clear fluids, such as water.  Do not drink alcohol.  Limit how much caffeine you drink or eat if told by your doctor.  Limit how much salt you drink or eat if told by your doctor. Activity  Avoid making quick movements.  When you stand up from sitting in a chair, steady yourself until you feel okay.  In the morning, first sit up on the side of the bed. When you feel okay, stand slowly while you hold onto something. Do this until you know that your balance is fine.  Move your legs often if you need to stand in one place for a long time. Tighten and relax your muscles in your legs while you are standing.  Do not drive or use heavy machinery if you feel dizzy.  Avoid bending down if you feel dizzy. Place items in your home so that they are easy for you to reach without leaning over. Lifestyle  Do not use any tobacco products, including cigarettes, chewing tobacco, or electronic cigarettes. If you need help quitting, ask your doctor.  Try to lower your stress level, such as with yoga or meditation. Talk with your doctor if you need help. General Instructions  Watch your dizziness for any changes.  Take medicines only as told by your doctor. Talk with your doctor if you think that your dizziness is caused by a medicine that you are taking.  Tell a friend or a family member that you are feeling dizzy. If he or  she notices any changes in your behavior, have this person call your doctor.  Keep all follow-up visits as told by your doctor. This is important. GET HELP IF:  Your dizziness does not go away.  Your dizziness or light-headedness gets worse.  You feel sick to your stomach (nauseous).  You have trouble hearing.  You have new symptoms.  You are unsteady on your feet or you feel like the room is spinning. GET HELP RIGHT AWAY IF:  You throw up (vomit) or have diarrhea and are unable to eat or drink anything.  You have trouble:  Talking.  Walking.  Swallowing.  Using your arms, hands, or legs.  You feel generally weak.  You are not thinking clearly or you have trouble forming sentences. It may take a friend or family member to notice this.  You have:  Chest pain.  Pain in your belly (abdomen).  Shortness of breath.  Sweating.  Your vision changes.  You are bleeding.  You have a headache.  You have neck pain or a stiff neck.  You have a fever.   This information is not intended to replace advice given to you by your health care provider. Make sure you discuss any questions you have with your health care provider.   Document Released: 04/19/2011 Document Revised: 09/14/2014 Document Reviewed: 04/26/2014 Elsevier Interactive Patient Education 2016 [place]  may use sudafed, zyrtec, claritin Otitis media with effusion is the presence of fluid in the middle ear. This is a common problem in children, which often follows ear infections. It may be present for weeks or longer after the infection. Unlike an acute ear infection, otitis media with effusion refers only to fluid behind the ear drum and not infection.  CAUSES  The most frequent cause of the fluid buildup is dysfunction of the eustachian tubes. These are the tubes that drain fluid in the ears to the back of the nose (nasopharynx). SYMPTOMS   The main symptom of this  condition is hearing loss. As a result, you or your child may:  Listen to the TV at a loud volume.  Not respond to questions.  Ask "what" often when spoken to.  Mistake or confuse one sound or word for another.  There may be a sensation of fullness or pressure but usually not pain. DIAGNOSIS   Your health care provider will diagnose this condition by examining you or your child's ears.  Your health care provider may test the pressure in you or your child's ear with a tympanometer.  A hearing test may be conducted if the problem persists. TREATMENT   Treatment depends on the duration and the effects of the effusion.  Antibiotics, decongestants, nose drops, and cortisone-type drugs (tablets or nasal spray) may not be helpful.  Children with persistent ear effusions may have delayed language or behavioral problems. Children at risk for developmental delays in hearing, learning, and speech may require referral to a specialist earlier than children not at risk.  You or your child's health care provider may suggest a referral to an ear, nose, and throat surgeon for treatment. The following may help restore normal hearing:  Drainage of fluid.  Placement of ear tubes (tympanostomy tubes).  Removal of adenoids (adenoidectomy). HOME CARE INSTRUCTIONS   Avoid secondhand smoke.  Infants who are breastfed are less likely to have this condition.  Avoid feeding infants while they are lying flat.  Avoid known environmental allergens.  Avoid people who are sick. SEEK MEDICAL CARE IF:   Hearing is not better in 3 months.  Hearing is worse.  Ear pain.  Drainage from the ear.  Dizziness. MAKE SURE YOU:   Understand these instructions.  Will watch your condition.  Will get help right away if you are not doing well or get worse.   This information is not intended to replace advice given to you by your health care provider. Make sure you discuss any questions you have with  your health care provider.   Document Released: 06/07/2004 Document Revised: 05/21/2014 Document Reviewed: 11/25/2012 Elsevier Interactive Patient Education [employer].

## 2015-06-13 NOTE — ED Notes (Signed)
The patient presented to the [facility] with a complaint of tinnitus in her right ear that she described as a throbbing noise. The patient stated that this has been ongoing for 2 months.

## 2015-06-26 ENCOUNTER — Emergency Department (HOSPITAL_COMMUNITY)
Admission: EM | Admit: 2015-06-26 | Discharge: 2015-06-26 | Disposition: A | Discharge status: Home / Self Care | Payer: Self-pay | Attending: Emergency Medicine | Admitting: Emergency Medicine

## 2015-06-26 ENCOUNTER — Encounter (HOSPITAL_COMMUNITY): Payer: Self-pay | Admitting: *Deleted

## 2015-06-26 DIAGNOSIS — Z7951 Long term (current) use of inhaled steroids: Secondary | ICD-10-CM | POA: Insufficient documentation

## 2015-06-26 DIAGNOSIS — Z79899 Other long term (current) drug therapy: Secondary | ICD-10-CM | POA: Insufficient documentation

## 2015-06-26 DIAGNOSIS — H9201 Otalgia, right ear: Secondary | ICD-10-CM | POA: Insufficient documentation

## 2015-06-26 MED ORDER — CETIRIZINE HCL 10 MG PO TABS: 10.0000 mg | ORAL_TABLET | Freq: Every day | ORAL | Status: DC

## 2015-06-26 MED ORDER — FLUTICASONE PROPIONATE 50 MCG/ACT NA SUSP: 2.0000 | Freq: Every day | NASAL | Status: DC

## 2015-06-26 NOTE — ED Notes (Signed)
Declined W/C at D/C and was escorted to lobby by RN. 

## 2015-06-26 NOTE — Discharge Instructions (Signed)
1. Medications: flonase, Zyrtec, usual home medications 2. Treatment: rest, drink plenty of fluids, take tylenol or ibuprofen for fever control 3. Follow Up: Please followup with your primary doctor in 3 days for discussion of your diagnoses and further evaluation after today's visit; if you do not have a primary care doctor use the resource guide provided to find one; Return to the ER for high fevers, difficulty breathing or other concerning symptoms    Earache An earache, also called otalgia, can be caused by many things. Pain from an earache can be sharp, dull, or burning. The pain may be temporary or constant. Earaches can be caused by problems with the ear, such as infection in either the middle ear or the ear canal, injury, impacted ear wax, middle ear pressure, or a foreign body in the ear. Ear pain can also result from problems in other areas. This is called referred pain. For example, pain can come from a sore throat, a tooth infection, or problems with the jaw or the joint between the jaw and the skull (temporomandibular joint, or TMJ). The cause of an earache is not always easy to identify. Watchful waiting may be appropriate for some earaches until a clear cause of the pain can be found. HOME CARE INSTRUCTIONS Watch your condition for any changes. The following actions may help to lessen any discomfort that you are feeling:  Take medicines only as directed by your health care provider. This includes ear drops.  Apply ice to your outer ear to help reduce pain.  Put ice in a plastic bag.  Place a towel between your skin and the bag.  Leave the ice on for 20 minutes, 2-3 times per day.  Do not put anything in your ear other than medicine that is prescribed by your health care provider.  Try resting in an upright position instead of lying down. This may help to reduce pressure in the middle ear and relieve pain.  Chew gum if it helps to relieve your ear pain.  Control any  allergies that you have.  Keep all follow-up visits as directed by your health care provider. This is important. SEEK MEDICAL CARE IF:  Your pain does not improve within 2 days.  You have a fever.  You have new or worsening symptoms. SEEK IMMEDIATE MEDICAL CARE IF:  You have a severe headache.  You have a stiff neck.  You have difficulty swallowing.  You have redness or swelling behind your ear.  You have drainage from your ear.  You have hearing loss.  You feel dizzy.   This information is not intended to replace advice given to you by your health care provider. Make sure you discuss any questions you have with your health care provider.   Document Released: 12/16/2003 Document Revised: 05/21/2014 Document Reviewed: 11/29/2013 Elsevier Interactive Patient Education 2016 [employer].    Emergency Department Resource Guide 1) Find a Doctor and Pay Out of Pocket Although you won't have to find out who is covered by your insurance plan, it is a good idea to ask around and get recommendations. You will then need to call the office and see if the doctor you have chosen will accept you as a new patient and what types of options they offer for patients who are self-pay. Some doctors offer discounts or will set up payment plans for their patients who do not have insurance, but you will need to ask so you aren't surprised when you get to your appointment.  2) Contact Your Local Health Department Not all health departments have doctors that can see patients for sick visits, but many do, so it is worth a call to see if yours does. If you don't know where your local health department is, you can check in your phone book. The CDC also has a tool to help you locate your state's health department, and many state websites also have listings of all of their local health departments.  3) Find a [facility] If your illness is not likely to be very severe or complicated, you may want to try  a walk in clinic. These are popping up all over the country in pharmacies, drugstores, and shopping centers. They're usually staffed by nurse practitioners or physician assistants that have been trained to treat common illnesses and complaints. They're usually fairly quick and inexpensive. However, if you have serious medical issues or chronic medical problems, these are probably not your best option.  No Primary Care Doctor: - Call Health Connect at  [phone] - they can help you locate a primary care doctor that  accepts your insurance, provides certain services, etc. - Physician Referral Service- [phone]  Chronic Pain Problems: Organization         Address  Phone   Notes  [facility]  [phone] Patients need to be referred by their primary care doctor.   Medication Assistance: Organization         Address  Phone   Notes  [facility] Medication [facility] [address] [place], [place] [address] [phone] --Must be a resident of [facility] -- Must have NO insurance coverage whatsoever (no Medicaid/ Medicare, etc.) -- The pt. MUST have a primary care doctor that directs their care regularly and follows them in the community   MedAssist  [phone]   [employer]  [phone]    Agencies that provide inexpensive medical care: Organization         Address  Phone   Notes  [place]  [phone]   [facility]    [phone]   [facility] [place], [place] [address] [phone]   [place] [address], [place] [phone]   Planned Parenthood    [phone]   [facility]    [phone]   [place] and [address], [place] Phone:  [phone], Fax:  [phone] Hours of Operation:  9 am - 6 pm, M-F.  Also accepts Medicaid/Medicare and self-pay.  [facility] for [place], [address], [place] Phone: [phone], Fax: [phone]. Hours of Operation:  8:30 am - 5:30 pm, M-F.  Also accepts Medicaid and self-pay.  [facility] [place] [address], [place] Phone: [phone]   [place], [place], [place] [phone], Ext. [phone] Mondays & Thursdays: 7-9 AM.  First 15 patients are seen on a first come, first serve basis.    [place] Providers:  Organization         Address  Phone   Notes  [facility] [address],  [phone] Also accepts self-pay patients.  [place], [place]  [phone]   [place], Suite  [facility] [phone]   [place] [address], [place] [phone]   Lucianne Lei [address], [place]   [phone] Only accepts [place] Access [place] patients after they have their name applied to their card.   Self-Pay (no insurance) in [facility]:  Organization         Address  Phone   Notes  Sickle Cell Patients, [facility] [place] [phone]   [facility] Urgent Care [place] [phone]   [facility] [place]  [place], [place], [place] [phone]   [facility]/Dr. Osei-Bonsu  [address], [place] or [address], [place] [phone] Phone number for both [place] and [place] locations is the same.  Urgent Medical and [facility] [address], [place] [phone]   [facility] [address], [place] or [address] [phone]   [facility] [address], [place] [phone], phone; [phone], fax  Sees patients 1st and 3rd Saturday of every month.  Must not qualify for public or private insurance (i.e. Medicaid, Medicare, [place] Health Choice, Veterans' Benefits)  Household income should be no more than 200% of the poverty level The clinic cannot treat you if you are pregnant or think you are pregnant  Sexually transmitted diseases are not treated at the clinic.    Dental Care: Organization         Address  Phone  Notes  [facility] Department of [facility] [place] [phone] Accepts children up to age 77 who are enrolled in [place] or [place]; pregnant women with a Medicaid card; and children who have applied for Medicaid or [place] Health Choice, but were declined, whose parents can pay a reduced fee at time of service.  [facility] Department of [facility]  [address], [place] [phone] Accepts children up to age 63 who are enrolled in [place] or [place]; pregnant women with a Medicaid card; and children who have applied for Medicaid or [place] Health Choice, but were declined, whose parents can pay a reduced fee at time of service.  [place] Adult Dental Access PROGRAM  [place] [phone] Patients are seen by appointment only. Walk-ins are not accepted. [place] will see patients 22 years of age and older. Monday - Tuesday (8am-5pm) Most Wednesdays (8:30-5pm) $30 per visit, cash only  [facility] Adult Dental Access PROGRAM  [address], [facility] [phone] Patients are seen by appointment only. Walk-ins are not accepted. [place] will see patients 47 years of age and older. One Wednesday Evening (Monthly: Volunteer Based).  $30 per visit, cash only  [place]  [phone] for adults; Children under age 7, call Graduate Pediatric Dentistry at [phone]. Children aged 48-14, please call [phone] to  request a pediatric application.  Dental services are provided in all areas of dental care including fillings, crowns and bridges, complete and partial dentures, implants, gum treatment, root canals, and extractions. Preventive care is also provided. Treatment is provided to both adults and children. Patients are selected via a lottery and there is often a waiting list.   [facility] [address],  [place]  [phone] [email or web address]   [facility] [address] [place], [place] [phone], Ext. [phone] Second and Fourth Thursday of each month, opens at 6:30 AM; Clinic ends at 9 AM.  Patients are seen on a first-come first-served basis, and a limited number are seen during each clinic.   [facility]  [address] Hill[place] Danker [place], [place] [phone]   Eligibility Requirements You must have lived in [place], [place], or [place] counties for at least the last three months.   You cannot be eligible for state or federal sponsored [employer], including [employer], [place], or [employer].   You generally cannot be eligible for healthcare insurance through your employer.    How to apply: Eligibility screenings are held every Tuesday and Wednesday afternoon from 1:00 pm until 4:00 pm. You do not need an appointment for the interview!  [facility] [address] [place], [place]   [place]  [place] Department  [place]  [phone]    Behavioral Health Resources in the Community: Intensive Outpatient Programs Organization         Address  Phone  Notes  [place]. [address], [place], [place] [phone]   [facility] Outpatient [address], [place], [place]   ADS: Alcohol & Drug Svcs [address], [place], [place]   [place] [address]  [place], [place] or [phone]   Substance Abuse Resources Organization         Address  Phone  Notes  Alcohol and Drug Services  [phone]   [place]  [phone]   [facility] [place]   Chinita Pester  [phone]   Residential & Outpatient Substance Abuse Program  [phone]   Psychological Services Organization         Address  Phone  Notes  [facility] [place]  [place]  [phone]   [place] [address] [employer] or [phone]    Mobile Crisis Teams Organization         Address  Phone  Notes  [facility], Mobile Crisis Care Unit  [phone]   [facility]  [address] [place], [place]   [place] Levels [address] [place] [phone]    Self-Help/Support Groups Organization         Address  Phone             Notes  [place]. of [place] - variety of support groups  [place] Call for more information  Narcotics Anonymous (NA), [facility] [address], [employer] [place]  2 meetings at this location   Special educational needs teacher         Address  Phone  Notes  [facility] [place],    [place]  [phone]   [facility]  [address] [place] [phone], [place], [place]   [place], [place] [phone] Admissions: 8am-3pm M-F  Incentives Substance [place] [address],    [place], [place] [phone]   [facility] [address] [place], [place], [place]   [facility] [address]  [place], [place]   Insight  Programs - Intensive Outpatient [address], [place], [place]   [facility] ([address]  [place], [place] [phone] or [phone]   [facility] Services (RTS) [address] [place], [place] Accepts Medicaid  [place] [address],  [place] [phone] Substance Abuse/Addiction Treatment   [facility] Organization         Address  Phone  Notes  [facility]  [phone]   Domenic Schwab, PhD [address] Arlis [place], [place]   [phone] or [phone]   [place], [place] [phone]   [place] [address], [place], [place] [phone] Insurance/Medicaid/sponsorship through [facility] and Families [address]                                    [place], [place] [phone] [place] [address] [place], [place] [phone]    Dr. Adele Schilder  [phone]   [facility] Dept. 1) [address], [place] 2) [place] 3)  [place], [place] [phone]  [phone]   [place] [phone] or [phone] (After Hours)

## 2015-06-26 NOTE — ED Provider Notes (Signed)
CSN: [id number]     Arrival date & time 06/26/15  0902 History  By signing my name below, I, Evelene Croon, attest that this documentation has been prepared under the direction and in the presence of non-physician practitioner, Abigail Butts, PA-C. Electronically Signed: Evelene Croon, Scribe. 06/26/2015. 9:38 AM.     Chief Complaint  Patient presents with  . Ear Fullness    The history is provided by the patient. No language interpreter was used.    HPI Comments:  Katherine Mcfarland is a 30 y.o. female who presents to the Emergency Department complaining of 4/10 right ear pain x ~ 2 months. Pt describes a throbbing noise in the ear and fullness. Pt was evaluated at Urgent Care ~ 2 weeks ago tfor the same and she was discharged with Sudafed which she has been taking every 4-6 hours without relief. She denies fever, chills, nasal congestion and sore throat. Pt has no other complaints or symptoms at this time.   History reviewed. No pertinent past medical history. Past Surgical History  Procedure Laterality Date  . Cesarean section    . Wisdom tooth extraction     Family History  Problem Relation Age of Onset  . Diabetes Maternal Grandmother   . Hypertension Maternal Grandmother    Social History  Substance Use Topics  . Smoking status: Never Smoker   . Smokeless tobacco: Never Used  . Alcohol Use: Yes     Comment: social   OB History    Gravida Para Term Preterm AB TAB SAB Ectopic Multiple Living   4 4 4       4      Review of Systems  Constitutional: Negative for fever and chills.  HENT: Positive for ear pain. Negative for congestion, ear discharge, rhinorrhea, sinus pressure and sore throat.   Respiratory: Negative for shortness of breath.   Cardiovascular: Negative for chest pain.  Gastrointestinal: Negative for nausea and vomiting.  Musculoskeletal: Negative for neck pain.  Skin: Negative for rash.  Neurological: Negative for headaches.    Allergies   Metronidazole  Home Medications   Prior to Admission medications   Medication Sig Start Date End Date Taking? Authorizing Provider  aspirin-acetaminophen-caffeine (EXCEDRIN MIGRAINE) [phone] MG per tablet Take 2 tablets by mouth every 6 (six) hours as needed for headache or migraine.    Historical Provider, MD  cetirizine (ZYRTEC ALLERGY) 10 MG tablet Take 1 tablet (10 mg total) by mouth daily. 06/26/15   Sheridyn Canino, PA-C  fluticasone (FLONASE) 50 MCG/ACT nasal spray Place 2 sprays into both nostrils daily. 06/26/15   Karishma Unrein, PA-C  SUMAtriptan (IMITREX) 100 MG tablet Take one tablet at onset of headache. May repeat in 2 hours. No more than 2 pills in 24 hours.   Follow-up with me on Monday to see how this is working. 10/26/14   Micheline Chapman, NP   BP 128/80 mmHg  Pulse 83  Temp(Src) 98.4 F (36.9 C) (Oral)  Resp 20  Ht 5\' 6"  (1.676 m)  Wt 110.904 kg  BMI 39.48 kg/m2  SpO2 100% Physical Exam  Constitutional: She is oriented to person, place, and time. She appears well-developed and well-nourished. No distress.  HENT:  Head: Normocephalic and atraumatic.  Right Ear: Tympanic membrane, external ear and ear canal normal.  Left Ear: Tympanic membrane, external ear and ear canal normal.  Nose: Mucosal edema present. No epistaxis. Right sinus exhibits no maxillary sinus tenderness and no frontal sinus tenderness. Left sinus exhibits no maxillary  sinus tenderness and no frontal sinus tenderness.  Mouth/Throat: Uvula is midline and mucous membranes are normal. Mucous membranes are not pale and not cyanotic. No oropharyngeal exudate, posterior oropharyngeal edema, posterior oropharyngeal erythema or tonsillar abscesses.  Eyes: Conjunctivae are normal. Pupils are equal, round, and reactive to light.  Neck: Normal range of motion and full passive range of motion without pain.  Cardiovascular: Normal rate and intact distal pulses.   Pulmonary/Chest: Effort normal and  breath sounds normal. No stridor.  Clear and equal breath sounds without focal wheezes, rhonchi, rales  Abdominal: Soft. Bowel sounds are normal. There is no tenderness.  Musculoskeletal: Normal range of motion.  Lymphadenopathy:    She has no cervical adenopathy.  Neurological: She is alert and oriented to person, place, and time.  Skin: Skin is warm and dry. No rash noted. She is not diaphoretic.  Psychiatric: She has a normal mood and affect.  Nursing note and vitals reviewed.   ED Course  Procedures   DIAGNOSTIC STUDIES:  Oxygen Saturation is 100% on RA, normal by my interpretation.    COORDINATION OF CARE:  9:25 AM Will discharge with nasal spray and ENT referral. Discussed treatment plan with pt at bedside and pt agreed to plan.   MDM   Final diagnoses:  Otalgia, right   Patient presents with otalgia. No evidence of otitis media. No concern for acute mastoiditis, meningitis.  Patient discharged home with nasal spray.  Advised patient to follow-up with ENT if symptoms persist.  I have also discussed reasons to return immediately to the ER.  Patient expresses understanding and agrees with plan. Pt appears safe for discharge.   I personally performed the services described in this documentation, which was scribed in my presence. The recorded information has been reviewed and is accurate.    Jarrett Soho Davaughn Hillyard, PA-C 06/26/15 [place], MD 07/05/15 2250

## 2015-06-26 NOTE — ED Notes (Signed)
Pt went to [facility] 2 weeks ago for worsening Sx's to RT ear. Pt was instructed to take sudafed for ear. Pt has now been taking for 2 weeks. No change in Sx's

## 2015-10-13 LAB — HM PAP SMEAR

## 2016-01-02 ENCOUNTER — Encounter: Payer: Self-pay | Admitting: Behavioral Health

## 2016-01-02 ENCOUNTER — Telehealth: Payer: Self-pay | Admitting: Behavioral Health

## 2016-01-02 NOTE — Telephone Encounter (Signed)
Pre-Visit Call completed with patient and chart updated.   Pre-Visit Info documented in Specialty Comments under SnapShot.    

## 2016-01-03 ENCOUNTER — Ambulatory Visit (INDEPENDENT_AMBULATORY_CARE_PROVIDER_SITE_OTHER): Payer: 59 | Admitting: Family Medicine

## 2016-01-03 ENCOUNTER — Encounter: Payer: Self-pay | Admitting: Family Medicine

## 2016-01-03 VITALS — BP 112/70 | HR 88 | Temp 98.5°F | Ht 66.0 in | Wt 246.4 lb

## 2016-01-03 DIAGNOSIS — H6521 Chronic serous otitis media, right ear: Secondary | ICD-10-CM

## 2016-01-03 DIAGNOSIS — E669 Obesity, unspecified: Secondary | ICD-10-CM

## 2016-01-03 MED ORDER — PHENTERMINE HCL 37.5 MG PO TABS: 37.5000 mg | ORAL_TABLET | Freq: Every day | ORAL | 0 refills | Status: DC

## 2016-01-03 NOTE — Progress Notes (Signed)
Pre visit review using our clinic review tool, if applicable. No additional management support is needed unless otherwise documented below in the visit note. 

## 2016-01-03 NOTE — Progress Notes (Signed)
Patient ID: Katherine Mcfarland, female    DOB: Aug 12, 1985  Age: 30 y.o. MRN: [id number]    Subjective:  Subjective  HPI Katherine Mcfarland presents to establish.  She has struggled with fluid in her ear for a while and was referred to ent but could not go because she did not have insurance.  She has used steroid nasal sprays and antihistamines with no relief.   She has also lost weight with adipex in the past -- it has been a few years --- she would like to try it again.     Review of Systems  Constitutional: Negative for appetite change, diaphoresis, fatigue and unexpected weight change.  Eyes: Negative for pain, redness and visual disturbance.  Respiratory: Negative for cough, chest tightness, shortness of breath and wheezing.   Cardiovascular: Negative for chest pain, palpitations and leg swelling.  Endocrine: Negative for cold intolerance, heat intolerance, polydipsia, polyphagia and polyuria.  Genitourinary: Negative for difficulty urinating, dysuria and frequency.  Neurological: Negative for dizziness, light-headedness, numbness and headaches.    History Past Medical History:  Diagnosis Date  . Allergy    pt. reports seasonal allergies    She has a past surgical history that includes Cesarean section and Wisdom tooth extraction.   Her family history includes Diabetes in her maternal grandmother; Hypertension in her father, maternal grandmother, and mother.She reports that she has never smoked. She has never used smokeless tobacco. She reports that she drinks alcohol. She reports that she does not use drugs.  Current Outpatient Prescriptions on File Prior to Visit  Medication Sig Dispense Refill  . aspirin-acetaminophen-caffeine (EXCEDRIN MIGRAINE) 250-250-65 MG per tablet Take 2 tablets by mouth every 6 (six) hours as needed for headache or migraine.    . Multiple Vitamin (MULTIVITAMIN) tablet Take 1 tablet by mouth daily.    . SUMAtriptan (IMITREX) 100 MG tablet Take one tablet  at onset of headache. May repeat in 2 hours. No more than 2 pills in 24 hours.   Follow-up with me on Monday to see how this is working. 10 tablet 0   No current facility-administered medications on file prior to visit.      Objective:  Objective  Physical Exam  Constitutional: She is oriented to person, place, and time. She appears well-developed and well-nourished.  HENT:  Head: Normocephalic and atraumatic.  Eyes: Conjunctivae and EOM are normal.  Neck: Normal range of motion. Neck supple. No JVD present. Carotid bruit is not present. No thyromegaly present.  Cardiovascular: Normal rate, regular rhythm and normal heart sounds.   No murmur heard. Pulmonary/Chest: Effort normal and breath sounds normal. No respiratory distress. She has no wheezes. She has no rales. She exhibits no tenderness.  Musculoskeletal: She exhibits no edema.  Neurological: She is alert and oriented to person, place, and time.  Psychiatric: She has a normal mood and affect.  Nursing note and vitals reviewed.  BP 112/70 (BP Location: Left Arm, Patient Position: Sitting, Cuff Size: Normal)   Pulse 88   Temp 98.5 F (36.9 C) (Oral)   Ht 5\' 6"  (1.676 m)   Wt 246 lb 6.4 oz (111.8 kg)   SpO2 98%   BMI 39.77 kg/m  Wt Readings from Last 3 Encounters:  01/03/16 246 lb 6.4 oz (111.8 kg)  06/26/15 244 lb 8 oz (110.9 kg)  10/26/14 238 lb 6.4 oz (108.1 kg)     Lab Results  Component Value Date   WBC 5.7 10/26/2014   HGB 12.0 10/26/2014  HCT 37.4 10/26/2014   PLT 290 10/26/2014   GLUCOSE 95 10/26/2014   CHOL 158 10/26/2014   TRIG 98 10/26/2014   HDL 38 (L) 10/26/2014   LDLCALC 100 (H) 10/26/2014   ALT 20 10/26/2014   AST 19 10/26/2014   NA 136 10/26/2014   K 4.5 10/26/2014   CL 104 10/26/2014   CREATININE 0.86 10/26/2014   BUN 16 10/26/2014   CO2 25 10/26/2014  ekg-- nsr  No results found.   Assessment & Plan:  Plan  I have discontinued Katherine Mcfarland's fluticasone and cetirizine. I am also  having her start on phentermine. Additionally, I am having her maintain her aspirin-acetaminophen-caffeine, SUMAtriptan, and multivitamin.  Meds ordered this encounter  Medications  . phentermine (ADIPEX-P) 37.5 MG tablet    Sig: Take 1 tablet (37.5 mg total) by mouth daily before breakfast.    Dispense:  30 tablet    Refill:  0    Problem List Items Addressed This Visit    None    Visit Diagnoses    Obesity    -  Primary   Relevant Medications   phentermine (ADIPEX-P) 37.5 MG tablet   Other Relevant Orders   EKG 12-Lead (Completed)   TSH   POCT urinalysis dipstick   Lipid panel   CBC with Differential/Platelet   Comprehensive metabolic panel   Chronic serous otitis media of right ear       Relevant Orders   Ambulatory referral to ENT      Follow-up: Return in about 4 weeks (around 01/31/2016) for weight check.  Ann Held, DO

## 2016-01-03 NOTE — Patient Instructions (Signed)
Obesity Obesity is defined as having too much total body fat and a body mass index (BMI) of 30 or more. BMI is an estimate of body fat and is calculated from your height and weight. BMI is typically calculated by your health care provider during regular wellness visits. Obesity happens when you consume more calories than you can burn by exercising or performing daily physical tasks. Prolonged obesity can cause major illnesses or emergencies, such as:  Stroke.  Heart disease.  Diabetes.  Cancer.  Arthritis.  High blood pressure (hypertension).  High cholesterol.  Sleep apnea.  Erectile dysfunction.  Infertility problems. CAUSES   Regularly eating unhealthy foods.  Physical inactivity.  Certain disorders, such as an underactive thyroid (hypothyroidism), Cushing's syndrome, and polycystic ovarian syndrome.  Certain medicines, such as steroids, some depression medicines, and antipsychotics.  Genetics.  Lack of sleep. DIAGNOSIS A health care provider can diagnose obesity after calculating your BMI. Obesity will be diagnosed if your BMI is 30 or higher. There are other methods of measuring obesity levels. Some other methods include measuring your skinfold thickness, your waist circumference, and comparing your hip circumference to your waist circumference. TREATMENT  A healthy treatment program includes some or all of the following:  Long-term dietary changes.  Exercise and physical activity.  Behavioral and lifestyle changes.  Medicine only under the supervision of your health care provider. Medicines may help, but only if they are used with diet and exercise programs. If your BMI is 40 or higher, your health care provider may recommend specialized surgery or programs to help with weight loss. An unhealthy treatment program includes:  Fasting.  Fad diets.  Supplements and drugs. These choices do not succeed in long-term weight control. HOME CARE  INSTRUCTIONS  Exercise and perform physical activity as directed by your health care provider. To increase physical activity, try the following:  Use stairs instead of elevators.  Park farther away from store entrances.  Garden, bike, or walk instead of watching television or using the computer.  Eat healthy, low-calorie foods and drinks on a regular basis. Eat more fruits and vegetables. Use low-calorie cookbooks or take healthy cooking classes.  Limit fast food, sweets, and processed snack foods.  Eat smaller portions.  Keep a daily journal of everything you eat. There are many free websites to help you with this. It may be helpful to measure your foods so you can determine if you are eating the correct portion sizes.  Avoid drinking alcohol. Drink more water and drinks without calories.  Take vitamins and supplements only as recommended by your health care provider.  Weight-loss support groups, registered dietitians, counselors, and stress reduction education can also be very helpful. SEEK IMMEDIATE MEDICAL CARE IF:  You have chest pain or tightness.  You have trouble breathing or feel short of breath.  You have weakness or leg numbness.  You feel confused or have trouble talking.  You have sudden changes in your vision.   This information is not intended to replace advice given to you by your health care provider. Make sure you discuss any questions you have with your health care provider.   Document Released: 06/07/2004 Document Revised: 05/21/2014 Document Reviewed: 06/06/2011 Elsevier Interactive Patient Education 2016 [employer]  

## 2016-01-04 ENCOUNTER — Other Ambulatory Visit (INDEPENDENT_AMBULATORY_CARE_PROVIDER_SITE_OTHER): Payer: 59

## 2016-01-04 DIAGNOSIS — N390 Urinary tract infection, site not specified: Secondary | ICD-10-CM | POA: Diagnosis not present

## 2016-01-04 DIAGNOSIS — R319 Hematuria, unspecified: Secondary | ICD-10-CM | POA: Diagnosis not present

## 2016-01-04 DIAGNOSIS — E669 Obesity, unspecified: Secondary | ICD-10-CM | POA: Diagnosis not present

## 2016-01-04 LAB — CBC WITH DIFFERENTIAL/PLATELET
BASOS PCT: 0.8 % (ref 0.0–3.0)
Basophils Absolute: 0 10*3/uL (ref 0.0–0.1)
EOS PCT: 4.3 % (ref 0.0–5.0)
Eosinophils Absolute: 0.3 10*3/uL (ref 0.0–0.7)
HEMATOCRIT: 38.2 % (ref 36.0–46.0)
Hemoglobin: 12.7 g/dL (ref 12.0–15.0)
LYMPHS ABS: 3 10*3/uL (ref 0.7–4.0)
LYMPHS PCT: 51.3 % — AB (ref 12.0–46.0)
MCHC: 33.2 g/dL (ref 30.0–36.0)
MCV: 82.5 fl (ref 78.0–100.0)
MONOS PCT: 6.9 % (ref 3.0–12.0)
Monocytes Absolute: 0.4 10*3/uL (ref 0.1–1.0)
NEUTROS ABS: 2.2 10*3/uL (ref 1.4–7.7)
NEUTROS PCT: 36.7 % — AB (ref 43.0–77.0)
PLATELETS: 319 10*3/uL (ref 150.0–400.0)
RBC: 4.63 Mil/uL (ref 3.87–5.11)
RDW: 14 % (ref 11.5–15.5)
WBC: 5.9 10*3/uL (ref 4.0–10.5)

## 2016-01-04 LAB — POCT URINALYSIS DIPSTICK
Bilirubin, UA: NEGATIVE
Glucose, UA: NEGATIVE
KETONES UA: NEGATIVE
Nitrite, UA: NEGATIVE
PH UA: 6
PROTEIN UA: NEGATIVE
UROBILINOGEN UA: 0.2

## 2016-01-04 LAB — LIPID PANEL
CHOLESTEROL: 168 mg/dL (ref 0–200)
HDL: 39 mg/dL — AB (ref >39.00)
LDL Cholesterol: 110 mg/dL — ABNORMAL HIGH (ref 0–99)
NonHDL: 128.51
TRIGLYCERIDES: 91 mg/dL (ref 0.0–149.0)
Total CHOL/HDL Ratio: 4
VLDL: 18.2 mg/dL (ref 0.0–40.0)

## 2016-01-04 LAB — COMPREHENSIVE METABOLIC PANEL
ALBUMIN: 4 g/dL (ref 3.5–5.2)
ALT: 26 U/L (ref 0–35)
AST: 22 U/L (ref 0–37)
Alkaline Phosphatase: 68 U/L (ref 39–117)
BUN: 13 mg/dL (ref 6–23)
CALCIUM: 8.8 mg/dL (ref 8.4–10.5)
CHLORIDE: 104 meq/L (ref 96–112)
CO2: 25 meq/L (ref 19–32)
Creatinine, Ser: 0.87 mg/dL (ref 0.40–1.20)
GFR: 96.74 mL/min (ref >60.00)
Glucose, Bld: 109 mg/dL — ABNORMAL HIGH (ref 70–99)
POTASSIUM: 3.9 meq/L (ref 3.5–5.1)
Sodium: 136 mEq/L (ref 135–145)
Total Bilirubin: 0.4 mg/dL (ref 0.2–1.2)
Total Protein: 7.3 g/dL (ref 6.0–8.3)

## 2016-01-04 LAB — TSH: TSH: 1.73 u[IU]/mL (ref 0.35–4.50)

## 2016-01-04 MED FILL — PHENTERMINE 37.5 MG TABLET: 37.5 | 30 days supply | Qty: 30 | Fill #0

## 2016-01-05 ENCOUNTER — Other Ambulatory Visit: Payer: Self-pay

## 2016-01-05 MED ORDER — LEVOCETIRIZINE DIHYDROCHLORIDE 5 MG PO TABS: 5.0000 mg | ORAL_TABLET | Freq: Every evening | ORAL | 3 refills | Status: DC

## 2016-01-05 MED ORDER — FLUTICASONE PROPIONATE 50 MCG/ACT NA SUSP: 2.0000 | Freq: Every day | NASAL | 6 refills | Status: DC

## 2016-01-05 MED FILL — FLUTICASONE PROP 50 MCG SPR: 50 | 30 days supply | Qty: 16 | Fill #0

## 2016-01-05 MED FILL — LEVOCETIRIZINE 5 MG TABLET: 5 | 90 days supply | Qty: 90 | Fill #0

## 2016-01-06 LAB — URINE CULTURE

## 2016-01-25 DIAGNOSIS — H9311 Tinnitus, right ear: Secondary | ICD-10-CM | POA: Insufficient documentation

## 2016-01-31 ENCOUNTER — Ambulatory Visit: Payer: 59 | Admitting: Family Medicine

## 2016-01-31 ENCOUNTER — Other Ambulatory Visit (HOSPITAL_COMMUNITY): Payer: Self-pay | Admitting: Otolaryngology

## 2016-01-31 ENCOUNTER — Other Ambulatory Visit (HOSPITAL_BASED_OUTPATIENT_CLINIC_OR_DEPARTMENT_OTHER): Payer: Self-pay | Admitting: Otolaryngology

## 2016-01-31 DIAGNOSIS — H9311 Tinnitus, right ear: Secondary | ICD-10-CM

## 2016-02-04 ENCOUNTER — Ambulatory Visit (HOSPITAL_BASED_OUTPATIENT_CLINIC_OR_DEPARTMENT_OTHER)
Admission: RE | Admit: 2016-02-04 | Discharge: 2016-02-04 | Disposition: A | Discharge status: Home / Self Care | Payer: 59 | Source: Ambulatory Visit | Attending: Otolaryngology | Admitting: Otolaryngology

## 2016-02-04 DIAGNOSIS — H9311 Tinnitus, right ear: Secondary | ICD-10-CM | POA: Insufficient documentation

## 2016-02-06 ENCOUNTER — Telehealth: Payer: Self-pay

## 2016-02-06 NOTE — Telephone Encounter (Signed)
Yes ---ok for 1 month 3.0 mg daily sq

## 2016-02-06 NOTE — Telephone Encounter (Signed)
patient was given a Sample of the Saxenda and is now requesting a script, please advise if it is ok to send a script to the pharmacy.    KP

## 2016-02-07 ENCOUNTER — Encounter: Payer: Self-pay | Admitting: Family Medicine

## 2016-02-07 ENCOUNTER — Ambulatory Visit (INDEPENDENT_AMBULATORY_CARE_PROVIDER_SITE_OTHER): Payer: 59 | Admitting: Family Medicine

## 2016-02-07 VITALS — BP 127/72 | HR 105 | Temp 98.3°F | Wt 237.0 lb

## 2016-02-07 DIAGNOSIS — R002 Palpitations: Secondary | ICD-10-CM | POA: Diagnosis not present

## 2016-02-07 DIAGNOSIS — R8290 Unspecified abnormal findings in urine: Secondary | ICD-10-CM | POA: Diagnosis not present

## 2016-02-07 LAB — POC URINALSYSI DIPSTICK (AUTOMATED)
Bilirubin, UA: NEGATIVE
Blood, UA: NEGATIVE
Glucose, UA: NEGATIVE
Ketones, UA: NEGATIVE
LEUKOCYTES UA: NEGATIVE
Nitrite, UA: NEGATIVE
PROTEIN UA: NEGATIVE
SPEC GRAV UA: 1.025
UROBILINOGEN UA: 2
pH, UA: 6

## 2016-02-07 MED ORDER — LIRAGLUTIDE -WEIGHT MANAGEMENT 18 MG/3ML ~~LOC~~ SOPN: 3.0000 mg | PEN_INJECTOR | Freq: Every day | SUBCUTANEOUS | 0 refills | Status: DC

## 2016-02-07 NOTE — Telephone Encounter (Signed)
Rx faxed.    KP 

## 2016-02-07 NOTE — Assessment & Plan Note (Signed)
Stop adipex con't saxenda rto 3 months or sooner prn

## 2016-02-07 NOTE — Assessment & Plan Note (Signed)
Check labs-- tsh Event monitor

## 2016-02-07 NOTE — Patient Instructions (Signed)

## 2016-02-07 NOTE — Progress Notes (Signed)
Pre visit review using our clinic review tool, if applicable. No additional management support is needed unless otherwise documented below in the visit note. 

## 2016-02-07 NOTE — Progress Notes (Signed)
Patient ID: Katherine Mcfarland, female    DOB: 08-Jan-1986  Age: 30 y.o. MRN: [id number]    Subjective:  Subjective  HPI Katherine Mcfarland presents for f/u saxenda. She stopped adipex 1 week ago and has started having palpitations.   Review of Systems  Constitutional: Negative for appetite change, diaphoresis, fatigue and unexpected weight change.  Eyes: Negative for pain, redness and visual disturbance.  Respiratory: Negative for cough, chest tightness, shortness of breath and wheezing.   Cardiovascular: Positive for palpitations. Negative for chest pain and leg swelling.  Endocrine: Negative for cold intolerance, heat intolerance, polydipsia, polyphagia and polyuria.  Genitourinary: Negative for difficulty urinating, dysuria and frequency.  Neurological: Negative for dizziness, light-headedness, numbness and headaches.    History Past Medical History:  Diagnosis Date  . Allergy    pt. reports seasonal allergies    She has a past surgical history that includes Cesarean section and Wisdom tooth extraction.   Her family history includes Diabetes in her maternal grandmother; Hypertension in her father, maternal grandmother, and mother.She reports that she has never smoked. She has never used smokeless tobacco. She reports that she drinks alcohol. She reports that she does not use drugs.  Current Outpatient Prescriptions on File Prior to Visit  Medication Sig Dispense Refill  . aspirin-acetaminophen-caffeine (EXCEDRIN MIGRAINE) 250-250-65 MG per tablet Take 2 tablets by mouth every 6 (six) hours as needed for headache or migraine.    . fluticasone (FLONASE) 50 MCG/ACT nasal spray Place 2 sprays into both nostrils daily. 16 g 6  . levocetirizine (XYZAL) 5 MG tablet Take 1 tablet (5 mg total) by mouth every evening. 90 tablet 3  . Liraglutide -Weight Management (SAXENDA) 18 MG/3ML SOPN Inject 3 mg into the skin daily. 15 mL 0  . Multiple Vitamin (MULTIVITAMIN) tablet Take 1 tablet by mouth  daily.    . SUMAtriptan (IMITREX) 100 MG tablet Take one tablet at onset of headache. May repeat in 2 hours. No more than 2 pills in 24 hours.   Follow-up with me on Monday to see how this is working. 10 tablet 0   No current facility-administered medications on file prior to visit.      Objective:  Objective  Physical Exam  Constitutional: She is oriented to person, place, and time. She appears well-developed and well-nourished. No distress.  HENT:  Head: Normocephalic and atraumatic.  Right Ear: External ear normal.  Left Ear: External ear normal.  Nose: Mucosal edema, rhinorrhea and sinus tenderness present. No nasal deformity. Right sinus exhibits maxillary sinus tenderness and frontal sinus tenderness. Left sinus exhibits maxillary sinus tenderness and frontal sinus tenderness.  Mouth/Throat: Oropharynx is clear and moist and mucous membranes are normal. No oropharyngeal exudate.  Eyes: Conjunctivae and EOM are normal. Pupils are equal, round, and reactive to light.  Neck: Normal range of motion. Neck supple. No JVD present. Carotid bruit is not present. No thyromegaly present.  Cardiovascular: Normal rate, regular rhythm and normal heart sounds.   No murmur heard. Pulmonary/Chest: Effort normal and breath sounds normal. No respiratory distress. She has no wheezes. She has no rales. She exhibits no tenderness.  Musculoskeletal: She exhibits no edema.  Lymphadenopathy:    She has no cervical adenopathy.  Neurological: She is alert and oriented to person, place, and time.  Skin: Skin is warm. She is not diaphoretic.  Psychiatric: She has a normal mood and affect. Her behavior is normal. Judgment and thought content normal.  Nursing note and vitals reviewed.  BP  127/72 (BP Location: Left Arm, Patient Position: Sitting, Cuff Size: Normal)   Pulse (!) 105   Temp 98.3 F (36.8 C) (Oral)   Wt 237 lb (107.5 kg)   SpO2 100%   BMI 38.25 kg/m  Wt Readings from Last 3 Encounters:    02/07/16 237 lb (107.5 kg)  01/03/16 246 lb 6.4 oz (111.8 kg)  06/26/15 244 lb 8 oz (110.9 kg)     Lab Results  Component Value Date   WBC 5.9 01/04/2016   HGB 12.7 01/04/2016   HCT 38.2 01/04/2016   PLT 319.0 01/04/2016   GLUCOSE 109 (H) 01/04/2016   CHOL 168 01/04/2016   TRIG 91.0 01/04/2016   HDL 39.00 (L) 01/04/2016   LDLCALC 110 (H) 01/04/2016   ALT 26 01/04/2016   AST 22 01/04/2016   NA 136 01/04/2016   K 3.9 01/04/2016   CL 104 01/04/2016   CREATININE 0.87 01/04/2016   BUN 13 01/04/2016   CO2 25 01/04/2016   TSH 1.73 01/04/2016    Mr Jodene Nam Head Wo Contrast  Result Date: 02/04/2016 CLINICAL DATA:  30 y/o F; right-sided pulsatile tinnitus for 9 months. EXAM: MRA HEAD WITHOUT CONTRAST MRA NECK WITHOUT CONTRAST TECHNIQUE: Angiographic images of the Circle of Willis were obtained using MRA technique without intravenous contrast. Angiographic images of the neck were obtained using MRA technique without intravenous contrast. Carotid stenosis measurements (when applicable) are obtained utilizing NASCET criteria, using the distal internal carotid diameter as the denominator. COMPARISON:  None. FINDINGS: MRA HEAD FINDINGS Bilateral internal carotid arteries, middle cerebral arteries, anterior cerebral arteries, anterior communicating artery, bilateral posterior communicating arteries are patent. Apparent outpouching of the left paraclinoid internal carotid artery is likely artifactual given that the finding is not present on the MRA of the neck which includes this portion of the circle of Willis within the field of view (series 2, image 77). Bilateral vertebral arteries, bilateral PICA the basilar artery, bilateral AICA, accessory left AICA, bilateral SCA, and bilateral PCA are patent. The right PICA origin is not included within the field of view. No occlusion, aneurysm or significant stenosis of the circle of Willis is identified. MRA NECK FINDINGS Carotid and vertebral arteries of the  neck are widely patent. No occlusion, aneurysm, dissection, or significant stenosis is identified. The aortic arch and vessel origins are not included within the field of view. No abnormal vessel wall signal on axial T1 fat saturated sequence. IMPRESSION: 1. No occlusion, aneurysm, or significant stenosis of the circle of Willis is identified. Normal MRA of the head. 2. No occlusion, aneurysm, dissection, or significant stenosis of the carotid or vertebral arteries of the neck. Normal MRA of the neck. Electronically Signed   By: Kristine Garbe M.D.   On: 02/04/2016 21:41   Mr Angiogram Neck Wo Contrast  Result Date: 02/04/2016 CLINICAL DATA:  30 y/o F; right-sided pulsatile tinnitus for 9 months. EXAM: MRA HEAD WITHOUT CONTRAST MRA NECK WITHOUT CONTRAST TECHNIQUE: Angiographic images of the Circle of Willis were obtained using MRA technique without intravenous contrast. Angiographic images of the neck were obtained using MRA technique without intravenous contrast. Carotid stenosis measurements (when applicable) are obtained utilizing NASCET criteria, using the distal internal carotid diameter as the denominator. COMPARISON:  None. FINDINGS: MRA HEAD FINDINGS Bilateral internal carotid arteries, middle cerebral arteries, anterior cerebral arteries, anterior communicating artery, bilateral posterior communicating arteries are patent. Apparent outpouching of the left paraclinoid internal carotid artery is likely artifactual given that the finding is not present on the  MRA of the neck which includes this portion of the circle of Willis within the field of view (series 2, image 77). Bilateral vertebral arteries, bilateral PICA the basilar artery, bilateral AICA, accessory left AICA, bilateral SCA, and bilateral PCA are patent. The right PICA origin is not included within the field of view. No occlusion, aneurysm or significant stenosis of the circle of Willis is identified. MRA NECK FINDINGS Carotid and  vertebral arteries of the neck are widely patent. No occlusion, aneurysm, dissection, or significant stenosis is identified. The aortic arch and vessel origins are not included within the field of view. No abnormal vessel wall signal on axial T1 fat saturated sequence. IMPRESSION: 1. No occlusion, aneurysm, or significant stenosis of the circle of Willis is identified. Normal MRA of the head. 2. No occlusion, aneurysm, dissection, or significant stenosis of the carotid or vertebral arteries of the neck. Normal MRA of the neck. Electronically Signed   By: Kristine Garbe M.D.   On: 02/04/2016 21:41     Assessment & Plan:  Plan  I have discontinued Ms. Kimpel's phentermine. I am also having her maintain her aspirin-acetaminophen-caffeine, SUMAtriptan, multivitamin, fluticasone, levocetirizine, and Liraglutide -Weight Management.  No orders of the defined types were placed in this encounter.   Problem List Items Addressed This Visit      Unprioritized   Palpitations    Check labs-- tsh Event monitor      Severe obesity (BMI >= 40) (HCC)    Stop adipex con't saxenda rto 3 months or sooner prn        Other Visit Diagnoses    Abnormal urine    -  Primary   Relevant Orders   POCT Urinalysis Dipstick (Automated) (Completed)   Urine Culture   Palpitation       Relevant Orders   EKG 12-Lead (Completed)   Thyroid Panel With TSH   Cardiac event monitor      Follow-up: Return in about 3 months (around 05/08/2016).  Ann Held, DO

## 2016-02-08 ENCOUNTER — Telehealth: Payer: Self-pay

## 2016-02-08 DIAGNOSIS — R8290 Unspecified abnormal findings in urine: Secondary | ICD-10-CM | POA: Diagnosis not present

## 2016-02-08 NOTE — Telephone Encounter (Signed)
[address] ([place]: [place]) [id number] Saxenda 18MG Fayne Mediate pen-injectors. 3 mg SubQ daily 15 ml for 30 days Status: PA Requested Created: September 26th, 2017  Sent: September 26th, 2017  Awaiting approval.      KP

## 2016-02-09 ENCOUNTER — Other Ambulatory Visit (INDEPENDENT_AMBULATORY_CARE_PROVIDER_SITE_OTHER): Payer: 59

## 2016-02-09 DIAGNOSIS — R002 Palpitations: Secondary | ICD-10-CM

## 2016-02-09 LAB — URINE CULTURE

## 2016-02-10 ENCOUNTER — Other Ambulatory Visit: Payer: Self-pay

## 2016-02-10 ENCOUNTER — Other Ambulatory Visit (INDEPENDENT_AMBULATORY_CARE_PROVIDER_SITE_OTHER): Payer: 59

## 2016-02-10 ENCOUNTER — Other Ambulatory Visit (HOSPITAL_COMMUNITY)
Admission: RE | Admit: 2016-02-10 | Discharge: 2016-02-10 | Disposition: A | Discharge status: Home / Self Care | Payer: 59 | Source: Ambulatory Visit | Attending: Family Medicine | Admitting: Family Medicine

## 2016-02-10 DIAGNOSIS — N760 Acute vaginitis: Secondary | ICD-10-CM | POA: Insufficient documentation

## 2016-02-10 DIAGNOSIS — N898 Other specified noninflammatory disorders of vagina: Secondary | ICD-10-CM

## 2016-02-10 DIAGNOSIS — Z113 Encounter for screening for infections with a predominantly sexual mode of transmission: Secondary | ICD-10-CM | POA: Diagnosis not present

## 2016-02-10 DIAGNOSIS — R300 Dysuria: Secondary | ICD-10-CM

## 2016-02-10 LAB — THYROID PANEL WITH TSH
Free Thyroxine Index: 2.4 (ref 1.4–3.8)
T3 Uptake: 26 % (ref 22–35)
T4 TOTAL: 9.1 ug/dL (ref 4.5–12.0)
TSH: 1.1 mIU/L

## 2016-02-11 LAB — URINE CULTURE

## 2016-02-13 LAB — URINE CYTOLOGY ANCILLARY ONLY
Chlamydia: NEGATIVE
Neisseria Gonorrhea: NEGATIVE
TRICH (WINDOWPATH): NEGATIVE

## 2016-02-13 NOTE — Telephone Encounter (Signed)
done

## 2016-02-13 NOTE — Telephone Encounter (Signed)
The appeal letter is waiting to be signed.      KP

## 2016-02-13 NOTE — Telephone Encounter (Signed)
Pt may need to call ins to find out why

## 2016-02-13 NOTE — Telephone Encounter (Signed)
Your request has been denied  PA Case [id number] is denied.

## 2016-02-13 NOTE — Telephone Encounter (Signed)
Appeal letter faxed.     KP

## 2016-02-15 ENCOUNTER — Telehealth: Payer: Self-pay

## 2016-02-15 ENCOUNTER — Other Ambulatory Visit: Payer: Self-pay | Admitting: Family Medicine

## 2016-02-15 ENCOUNTER — Other Ambulatory Visit: Payer: Self-pay | Admitting: Medical

## 2016-02-15 DIAGNOSIS — N760 Acute vaginitis: Secondary | ICD-10-CM

## 2016-02-15 DIAGNOSIS — B9689 Other specified bacterial agents as the cause of diseases classified elsewhere: Secondary | ICD-10-CM

## 2016-02-15 LAB — URINE CYTOLOGY ANCILLARY ONLY
BACTERIAL VAGINITIS: POSITIVE — AB
CANDIDA VAGINITIS: NEGATIVE

## 2016-02-15 MED ORDER — METRONIDAZOLE 0.75 % VA GEL: 1.0000 | Freq: Every day | VAGINAL | 0 refills | Status: AC

## 2016-02-15 MED ORDER — CLINDAMYCIN HCL 300 MG PO CAPS: ORAL_CAPSULE | ORAL | 0 refills | Status: DC

## 2016-02-15 MED ORDER — FLUCONAZOLE 150 MG PO TABS: 150.0000 mg | ORAL_TABLET | Freq: Once | ORAL | 0 refills | Status: DC

## 2016-02-15 MED FILL — FLUCONAZOLE 150 MG TABLET: 150 | 1 days supply | Qty: 1 | Fill #0

## 2016-02-15 MED FILL — CLINDAMYCIN HCL 300 MG CAPS: 300 | 7 days supply | Qty: 14 | Fill #0

## 2016-02-15 NOTE — Telephone Encounter (Signed)
Talked with pt. She states allergic to flagyl. She has used oral clindamycin in past for bv(rather than intravaginal tx). Wrote rx clindamycin caps. Advise her to use probiotic while on clindamycin.

## 2016-02-15 NOTE — Telephone Encounter (Signed)
Patient needs Rx called in for Clindomycin for Dx of Bacterial Vaginosis.

## 2016-02-15 NOTE — Telephone Encounter (Signed)
Patient requested Diflucan as well. Sent to pharmacy per E. Saguier. Notified patient Clindamycin has been sent to pharmacy.

## 2016-02-17 ENCOUNTER — Ambulatory Visit (INDEPENDENT_AMBULATORY_CARE_PROVIDER_SITE_OTHER): Payer: 59 | Admitting: Family Medicine

## 2016-02-17 ENCOUNTER — Encounter: Payer: Self-pay | Admitting: Family Medicine

## 2016-02-17 VITALS — BP 120/70 | HR 89 | Temp 98.3°F | Ht 66.0 in | Wt 237.0 lb

## 2016-02-17 DIAGNOSIS — Z30432 Encounter for removal of intrauterine contraceptive device: Secondary | ICD-10-CM | POA: Diagnosis not present

## 2016-02-17 NOTE — Progress Notes (Signed)
Patient is here for an IUD removal. She has had it in for 4 years and is having it removed due to side effects such as wooshing in her ears and migraines.  Procedure Note; IUD removal Consent obtained. The patient was placed in the dorsal lithotomy position.  A speculum was entered into the vagina and the cervix and IUD strings were visualized. A ringed forceps was used to grasp the strings and gently remove it from the uterus. There were no complications noted. The patient tolerated the procedure well.  She plans on practicing abstinence for now. F/u prn. Pt voiced understanding and agreement to the plan.  Jud, DO 02/17/16 9:58 AM

## 2016-02-21 NOTE — Telephone Encounter (Signed)
Appeal has been approved from 02/13/16 until 06/16/2016.     KP

## 2016-02-27 ENCOUNTER — Telehealth: Payer: Self-pay

## 2016-02-27 NOTE — Telephone Encounter (Signed)
There is only 1 non-hormonal and reversible method of contraception that I am aware of (outside of condoms, diaphragms, etc) and that is the Paragard, which is the copper IUD. If she would like to discuss permanent options, we can refer GYN. Thanks.

## 2016-02-27 NOTE — Telephone Encounter (Addendum)
Patient stated she wanted something that had a low amount of hormones. Please advise    KP

## 2016-02-27 NOTE — Telephone Encounter (Signed)
Patient had her IUD removed on 10/6 and is inquiring about starting a new birth control method, she wanted something with no hormones. Please advise    KP

## 2016-02-28 ENCOUNTER — Other Ambulatory Visit: Payer: Self-pay

## 2016-02-28 MED ORDER — FLUCONAZOLE 150 MG PO TABS: 150.0000 mg | ORAL_TABLET | Freq: Once | ORAL | 0 refills | Status: DC

## 2016-02-28 MED ORDER — PEN NEEDLES 32G X 6 MM MISC: 3.0000 mg | Freq: Every day | 3 refills | Status: DC

## 2016-02-28 MED FILL — FLUCONAZOLE 150 MG TABLET: 150 | 2 days supply | Qty: 2 | Fill #0

## 2016-02-28 NOTE — Progress Notes (Signed)
Patient lost her Diflucan Rx and needed it resent to the pharmacy. Rx faxed.   KP

## 2016-02-29 ENCOUNTER — Other Ambulatory Visit: Payer: Self-pay | Admitting: Family Medicine

## 2016-02-29 ENCOUNTER — Ambulatory Visit (INDEPENDENT_AMBULATORY_CARE_PROVIDER_SITE_OTHER): Payer: 59

## 2016-02-29 DIAGNOSIS — R002 Palpitations: Secondary | ICD-10-CM

## 2016-02-29 MED ORDER — NORETHIN ACE-ETH ESTRAD-FE 1-20 MG-MCG(24) PO TABS: 1.0000 | ORAL_TABLET | Freq: Every day | ORAL | 11 refills | Status: DC

## 2016-02-29 MED FILL — LARIN 24 FE 1 MG-20 MCG TAB: 1-20 | 84 days supply | Qty: 84 | Fill #0

## 2016-03-02 DIAGNOSIS — R002 Palpitations: Secondary | ICD-10-CM | POA: Diagnosis not present

## 2016-03-21 MED FILL — FLUTICASONE PROP 50 MCG SPR: 50 | 30 days supply | Qty: 16 | Fill #1

## 2016-03-21 MED FILL — LEVOCETIRIZINE 5 MG TABLET: 5 | 90 days supply | Qty: 90 | Fill #1

## 2016-03-29 ENCOUNTER — Other Ambulatory Visit: Payer: Self-pay | Admitting: Family Medicine

## 2016-03-29 MED ORDER — LORCASERIN HCL ER 20 MG PO TB24: 1.0000 | ORAL_TABLET | Freq: Every day | ORAL | 2 refills | Status: DC

## 2016-04-03 ENCOUNTER — Other Ambulatory Visit: Payer: Self-pay | Admitting: Family Medicine

## 2016-04-03 DIAGNOSIS — R002 Palpitations: Secondary | ICD-10-CM

## 2016-04-03 MED ORDER — METOPROLOL SUCCINATE ER 25 MG PO TB24: 25.0000 mg | ORAL_TABLET | Freq: Every day | ORAL | 3 refills | Status: DC

## 2016-04-04 MED FILL — METOPROLOL SUCC ER 25 MG TA: 25 | 90 days supply | Qty: 90 | Fill #0

## 2016-04-09 ENCOUNTER — Encounter: Payer: Self-pay | Admitting: Family Medicine

## 2016-04-09 ENCOUNTER — Telehealth: Payer: Self-pay | Admitting: Family Medicine

## 2016-04-09 DIAGNOSIS — M545 Low back pain, unspecified: Secondary | ICD-10-CM

## 2016-04-09 MED ORDER — CYCLOBENZAPRINE HCL 10 MG PO TABS: 10.0000 mg | ORAL_TABLET | Freq: Three times a day (TID) | ORAL | 0 refills | Status: DC | PRN

## 2016-04-09 MED ORDER — MELOXICAM 15 MG PO TABS: 15.0000 mg | ORAL_TABLET | Freq: Every day | ORAL | 0 refills | Status: DC

## 2016-04-09 MED FILL — MELOXICAM 15 MG TABLET: 15 | 30 days supply | Qty: 30 | Fill #0

## 2016-04-09 MED FILL — CYCLOBENZAPRINE 10 MG TAB: 10 | 10 days supply | Qty: 30 | Fill #0

## 2016-04-09 NOTE — Telephone Encounter (Signed)
Pt c/o back pain since weekend--- no known injury.  She did have cramps worse than usual this month. Cramps have resolved--- still c/o spasms in back Should have ov If no relief Flexeril sent to pharmacy --- can make her tired mobic 15 mg 1/2 -1 po qd prn #30  2 refills

## 2016-04-17 ENCOUNTER — Other Ambulatory Visit: Payer: Self-pay

## 2016-04-17 MED ORDER — FLUCONAZOLE 150 MG PO TABS: 150.0000 mg | ORAL_TABLET | Freq: Once | ORAL | 0 refills | Status: AC

## 2016-04-17 MED ORDER — CLINDAMYCIN HCL 300 MG PO CAPS: ORAL_CAPSULE | ORAL | 0 refills | Status: DC

## 2016-04-17 MED FILL — CLINDAMYCIN HCL 300 MG CAPS: 300 | 7 days supply | Qty: 14 | Fill #0

## 2016-04-17 MED FILL — FLUCONAZOLE 150 MG TABLET: 150 | 2 days supply | Qty: 2 | Fill #0

## 2016-04-25 ENCOUNTER — Ambulatory Visit: Payer: 59 | Admitting: Cardiovascular Disease

## 2016-05-30 ENCOUNTER — Ambulatory Visit: Payer: 59 | Admitting: Cardiology

## 2016-06-18 ENCOUNTER — Other Ambulatory Visit: Payer: Self-pay | Admitting: Family Medicine

## 2016-06-18 MED ORDER — OSELTAMIVIR PHOSPHATE 75 MG PO CAPS: 75.0000 mg | ORAL_CAPSULE | Freq: Every day | ORAL | 0 refills | Status: DC

## 2016-06-18 MED FILL — OSELTAMIVIR PHOS 75 MG CAP: 75 | 10 days supply | Qty: 10 | Fill #0

## 2016-06-18 NOTE — Progress Notes (Signed)
Patient son diagnosed with Flu in ER over the weekend. Started on prophylactic Tamiflu today x 10 day

## 2016-06-19 ENCOUNTER — Other Ambulatory Visit: Payer: Self-pay

## 2016-06-19 ENCOUNTER — Other Ambulatory Visit (INDEPENDENT_AMBULATORY_CARE_PROVIDER_SITE_OTHER): Payer: 59

## 2016-06-19 DIAGNOSIS — N926 Irregular menstruation, unspecified: Secondary | ICD-10-CM

## 2016-06-20 LAB — HCG, SERUM, QUALITATIVE: Preg, Serum: NEGATIVE

## 2016-06-25 ENCOUNTER — Ambulatory Visit: Payer: 59 | Admitting: Family Medicine

## 2016-07-06 ENCOUNTER — Other Ambulatory Visit: Payer: Self-pay | Admitting: Family Medicine

## 2016-07-06 ENCOUNTER — Other Ambulatory Visit: Payer: Self-pay

## 2016-07-06 DIAGNOSIS — R002 Palpitations: Secondary | ICD-10-CM

## 2016-07-06 DIAGNOSIS — Z889 Allergy status to unspecified drugs, medicaments and biological substances status: Secondary | ICD-10-CM

## 2016-07-06 MED ORDER — METOPROLOL SUCCINATE ER 25 MG PO TB24: 25.0000 mg | ORAL_TABLET | Freq: Every day | ORAL | 3 refills | Status: DC

## 2016-07-06 MED ORDER — MONTELUKAST SODIUM 10 MG PO TABS: 10.0000 mg | ORAL_TABLET | Freq: Every day | ORAL | 5 refills | Status: DC

## 2016-07-06 MED FILL — METOPROLOL SUCC ER 25 MG TA: 25 | 90 days supply | Qty: 90 | Fill #0

## 2016-07-06 MED FILL — MONTELUKAST SOD 10 MG TAB: 10 | 30 days supply | Qty: 30 | Fill #0

## 2016-07-11 ENCOUNTER — Encounter: Payer: Self-pay | Admitting: Cardiology

## 2016-07-11 ENCOUNTER — Ambulatory Visit (INDEPENDENT_AMBULATORY_CARE_PROVIDER_SITE_OTHER): Payer: 59 | Admitting: Cardiology

## 2016-07-11 VITALS — BP 110/80 | HR 95 | Ht 66.0 in | Wt 248.6 lb

## 2016-07-11 DIAGNOSIS — I471 Supraventricular tachycardia: Secondary | ICD-10-CM

## 2016-07-11 DIAGNOSIS — R9431 Abnormal electrocardiogram [ECG] [EKG]: Secondary | ICD-10-CM

## 2016-07-11 MED ORDER — METOPROLOL SUCCINATE ER 25 MG PO TB24: 25.0000 mg | ORAL_TABLET | Freq: Every evening | ORAL | 3 refills | Status: DC

## 2016-07-11 NOTE — Patient Instructions (Signed)
Medication Instructions:  Please start Metoprolol succinate 25 mg at bedtime. Continue all other medications as listed.  Testing/Procedures: Your physician has requested that you have an echocardiogram. Echocardiography is a painless test that uses sound waves to create images of your heart. It provides your doctor with information about the size and shape of your heart and how well your heart's chambers and valves are working. This procedure takes approximately one hour. There are no restrictions for this procedure.  Follow-Up: Follow up in 6 months with Dr. Marlou Porch.  You will receive a letter in the mail 2 months before you are due.  Please call us when you receive this letter to schedule your follow up appointment.  If you need a refill on your cardiac medications before your next appointment, please call your pharmacy.  Thank you for choosing [facility]!!

## 2016-07-11 NOTE — Progress Notes (Signed)
Cardiology Office Note    Date:  07/11/2016   ID:  [name], [name] December 11, 1985, MRN [id number]  PCP:  Ann Held, DO  Cardiologist:   Candee Furbish, MD   Chief Complaint  Patient presents with  . New Patient (Initial Visit)    palpitations    History of Present Illness:  Katherine Mcfarland is a 31 y.o. female here for evaluation of palpitations at the request of Dr. Etter Sjogren.  She works with [employer], and occasionally when at work or while resting she noted that her heart rate was increased. Felt unusual, sporadic. No shortness of breath, no chest pain, no syncope. Sometimes when she would grab her chest or bear down this would stop.  An event monitor was placed in October 2017 and demonstrated transient paroxysmal supraventricular tachycardia. Personally viewed. One episode lasted less than 1 minute.  She was placed on metoprolol and when she took it in the morning hours, she was tired most of the day. When she took it at night she did quite well with it. When she was on the metoprolol she did not really feel any further palpitations. She has been in tune with the sensation.  Her mother and father do not have any early coronary artery disease or heart disease. She is a nonsmoker. Very rarely will drink alcohol. No excessive stimulants. In the past her phentermine was discontinued.     Past Medical History:  Diagnosis Date  . Allergy    pt. reports seasonal allergies    Past Surgical History:  Procedure Laterality Date  . CESAREAN SECTION    . WISDOM TOOTH EXTRACTION      Current Medications: Outpatient Medications Prior to Visit  Medication Sig Dispense Refill  . aspirin-acetaminophen-caffeine (EXCEDRIN MIGRAINE) 250-250-65 MG per tablet Take 2 tablets by mouth every 6 (six) hours as needed for headache or migraine.    . fluticasone (FLONASE) 50 MCG/ACT nasal spray Place 2 sprays into both nostrils daily. 16 g 6  . SUMAtriptan (IMITREX) 100 MG tablet Take  one tablet at onset of headache. May repeat in 2 hours. No more than 2 pills in 24 hours.   Follow-up with me on Monday to see how this is working. 10 tablet 0  . Insulin Pen Needle (PEN NEEDLES) 32G X 6 MM MISC Inject 3 mg into the skin daily. (Patient not taking: Reported on 07/11/2016) 100 each 3  . levocetirizine (XYZAL) 5 MG tablet Take 1 tablet (5 mg total) by mouth every evening. (Patient not taking: Reported on 07/11/2016) 90 tablet 3  . metoprolol succinate (TOPROL-XL) 25 MG 24 hr tablet Take 1 tablet (25 mg total) by mouth daily. (Patient not taking: Reported on 07/11/2016) 30 tablet 3  . montelukast (SINGULAIR) 10 MG tablet Take 1 tablet (10 mg total) by mouth at bedtime. (Patient not taking: Reported on 07/11/2016) 30 tablet 5  . Multiple Vitamin (MULTIVITAMIN) tablet Take 1 tablet by mouth daily.    . Norethindrone Acetate-Ethinyl Estrad-FE (LOESTRIN 24 FE) 1-20 MG-MCG(24) tablet Take 1 tablet by mouth daily. (Patient not taking: Reported on 07/11/2016) 1 Package 11   No facility-administered medications prior to visit.      Allergies:   Metronidazole   Social History   Social History  . Marital status: Single    Spouse name: N/A  . Number of children: N/A  . Years of education: N/A   Occupational History  . [employer]   Social History Main Topics  .  Smoking status: Never Smoker  . Smokeless tobacco: Never Used  . Alcohol use Yes     Comment: social  . Drug use: No  . Sexual activity: Yes    Birth control/ protection: IUD   Other Topics Concern  . Not on file   Social History Narrative  . No narrative on file     Family History:  The patient's family history includes Diabetes in her maternal grandmother; Hypertension in her father, maternal grandmother, and mother.   ROS:   Please see the history of present illness.    ROS All other systems reviewed and are negative.   PHYSICAL EXAM:   VS:  BP 110/80   Pulse 95   Ht 5\' 6"  (1.676 m)   Wt 248 lb 9.6 oz  (112.8 kg)   BMI 40.13 kg/m    GEN: Well nourished, well developed, in no acute distress  HEENT: normal  Neck: no JVD, carotid bruits, or masses Cardiac: RRR; no murmurs, rubs, or gallops,no edema  Respiratory:  clear to auscultation bilaterally, normal work of breathing GI: soft, nontender, nondistended, + BS, Overweight MS: no deformity or atrophy  Skin: warm and dry, no rash Neuro:  Alert and Oriented x 3, Strength and sensation are intact Psych: euthymic mood, full affect  Wt Readings from Last 3 Encounters:  07/11/16 248 lb 9.6 oz (112.8 kg)  02/17/16 237 lb (107.5 kg)  02/07/16 237 lb (107.5 kg)      Studies/Labs Reviewed:   EKG:  EKG is ordered today.  The ekg ordered today demonstrates 07/11/16-sinus rhythm, T-wave inversion in lead 3, mildly inverted in aVF. No other abnormalities.  Recent Labs: 01/04/2016: ALT 26; BUN 13; Creatinine, Ser 0.87; Hemoglobin 12.7; Platelets 319.0; Potassium 3.9; Sodium 136 02/09/2016: TSH 1.10   Lipid Panel    Component Value Date/Time   CHOL 168 01/04/2016 0802   TRIG 91.0 01/04/2016 0802   HDL 39.00 (L) 01/04/2016 0802   CHOLHDL 4 01/04/2016 0802   VLDL 18.2 01/04/2016 0802   LDLCALC 110 (H) 01/04/2016 0802    Additional studies/ records that were reviewed today include:  EKG reviewed, event monitor reviewed revealing SVT 160 bpm    ASSESSMENT:    1. PSVT (paroxysmal supraventricular tachycardia) ([place])   2. Abnormal EKG   3. Morbid obesity ([place])      PLAN:  In order of problems listed above:  PSVT/abnormal EKG - first investigated in October 2017  - We will check echocardiogram to ensure proper structure and function of her heart  - When she was taking metoprolol, symptoms abated. I will ask her to take Toprol-XL 25 mg at night.  - If she begins to have significant side effects from her medication, I would suggest that we send her to electrophysiology for possible ablative therapy. We discussed.  - Prior TSH and  electrolytes were normal.   Morbid Obesity  - Continue to encourage weight loss, BMI 40. She has been working with Dr. Etter Sjogren on weight management.  - This could be contributing as well to arrhythmias.   I would like for her to come back in 6 months for follow-up  Medication Adjustments/Labs and Tests Ordered: Current medicines are reviewed at length with the patient today.  Concerns regarding medicines are outlined above.  Medication changes, Labs and Tests ordered today are listed in the Patient Instructions below. Patient Instructions  Medication Instructions:  Please start Metoprolol succinate 25 mg at bedtime. Continue all other medications as listed.  Testing/Procedures:  Your physician has requested that you have an echocardiogram. Echocardiography is a painless test that uses sound waves to create images of your heart. It provides your doctor with information about the size and shape of your heart and how well your heart's chambers and valves are working. This procedure takes approximately one hour. There are no restrictions for this procedure.  Follow-Up: Follow up in 6 months with Dr. Marlou Porch.  You will receive a letter in the mail 2 months before you are due.  Please call us when you receive this letter to schedule your follow up appointment.  If you need a refill on your cardiac medications before your next appointment, please call your pharmacy.  Thank you for choosing [facility]!!        Signed, Candee Furbish, MD  07/11/2016 2:35 PM    [address], [place], [place]  [address] Phone: [phone]; Fax: [phone]

## 2016-07-23 ENCOUNTER — Ambulatory Visit (INDEPENDENT_AMBULATORY_CARE_PROVIDER_SITE_OTHER): Payer: 59 | Admitting: Family Medicine

## 2016-07-23 ENCOUNTER — Encounter (HOSPITAL_BASED_OUTPATIENT_CLINIC_OR_DEPARTMENT_OTHER): Payer: Self-pay

## 2016-07-23 ENCOUNTER — Ambulatory Visit (HOSPITAL_BASED_OUTPATIENT_CLINIC_OR_DEPARTMENT_OTHER)
Admission: RE | Admit: 2016-07-23 | Discharge: 2016-07-23 | Disposition: A | Discharge status: Home / Self Care | Payer: 59 | Source: Ambulatory Visit | Attending: Family Medicine | Admitting: Family Medicine

## 2016-07-23 ENCOUNTER — Encounter: Payer: Self-pay | Admitting: Family Medicine

## 2016-07-23 VITALS — BP 100/68 | HR 77 | Temp 97.8°F | Ht 66.0 in | Wt 247.0 lb

## 2016-07-23 DIAGNOSIS — R1032 Left lower quadrant pain: Secondary | ICD-10-CM

## 2016-07-23 DIAGNOSIS — D259 Leiomyoma of uterus, unspecified: Secondary | ICD-10-CM | POA: Diagnosis not present

## 2016-07-23 NOTE — Progress Notes (Signed)
Pre visit review using our clinic review tool, if applicable. No additional management support is needed unless otherwise documented below in the visit note. 

## 2016-07-23 NOTE — Progress Notes (Signed)
Chief Complaint  Patient presents with  . Abdominal Pain    (L) lower side (comes and goes) until Sat     [place] is here for abdominal pain.  Duration: 3 months, no injury or change in activity/diet This past weekend, started and has been constant Describes pain as an ache. Bleeding? No  Weight loss? No Palliation: Nothing Provocation: Movement, valsalva Associated symptoms: none Denies: fever, nausea, vomiting and diarrhea/constipation Treatment to date: NSAIDs,   ROS: Constitutional: No fevers GI: No N/V/D/C, no bleeding +pain  Past Medical History:  Diagnosis Date  . Allergy    pt. reports seasonal allergies   Family History  Problem Relation Age of Onset  . Diabetes Maternal Grandmother   . Hypertension Maternal Grandmother   . Hypertension Mother   . Hypertension Father    Past Surgical History:  Procedure Laterality Date  . CESAREAN SECTION    . WISDOM TOOTH EXTRACTION      BP 100/68 (BP Location: Left Arm, Patient Position: Sitting, Cuff Size: Large)   Pulse 77   Temp 97.8 F (36.6 C) (Oral)   Ht 5\' 6"  (1.676 m)   Wt 247 lb (112 kg)   LMP 06/25/2016 (Approximate)   SpO2 99%   BMI 39.87 kg/m  Gen.: Awake, alert, appears stated age 31: Mucous membranes moist without mucosal lesions Heart: Regular rate and rhythm without murmurs Lungs: Clear auscultation bilaterally, no rales or wheezing, normal effort without accessory muscle use. Abdomen: Bowel sounds are present. Abdomen is soft, TTP in LLQ, no guarding, nondistended, no masses or organomegaly. Negative Murphy's, Rovsing's, McBurney's; + Carnett's sign. Psych: Age appropriate judgment and insight. Normal mood and affect.  Left lower quadrant pain - Plan: US Transvaginal Non-OB, US Pelvis Complete  Orders as above. Will do today. Drink 32 oz of water 1 hr prior to study (1030) and don't urinate.  Pt has already tried tx for msk etiology. If unremarkable [place], would consider trial of  MiraLax vs AXR. F/u pending the above. Pt voiced understanding and agreement to the plan.  [place], DO 07/23/16 7:50 AM

## 2016-07-25 ENCOUNTER — Other Ambulatory Visit (HOSPITAL_COMMUNITY): Payer: 59

## 2016-08-15 ENCOUNTER — Ambulatory Visit (HOSPITAL_COMMUNITY): Payer: 59 | Attending: Internal Medicine

## 2016-08-15 ENCOUNTER — Other Ambulatory Visit: Payer: Self-pay

## 2016-08-15 DIAGNOSIS — I471 Supraventricular tachycardia: Secondary | ICD-10-CM | POA: Diagnosis not present

## 2016-08-15 DIAGNOSIS — R9431 Abnormal electrocardiogram [ECG] [EKG]: Secondary | ICD-10-CM | POA: Diagnosis not present

## 2016-08-16 ENCOUNTER — Telehealth: Payer: Self-pay | Admitting: Cardiology

## 2016-08-16 NOTE — Telephone Encounter (Signed)
New message ° ° ° °Pt is returning call about results. °

## 2016-08-16 NOTE — Telephone Encounter (Signed)
Reviewed results of echo with pt who states understanding.  She does still complain of palpitations.  Reassured of normal heart structure and function.  Reminded her to avoid all stimulants from soda, tea, coffee, chocolate and/or OTC medications.  She states understanding.

## 2016-08-21 ENCOUNTER — Telehealth (INDEPENDENT_AMBULATORY_CARE_PROVIDER_SITE_OTHER): Payer: 59 | Admitting: *Deleted

## 2016-08-21 ENCOUNTER — Other Ambulatory Visit (HOSPITAL_COMMUNITY)
Admission: RE | Admit: 2016-08-21 | Discharge: 2016-08-21 | Disposition: A | Discharge status: Home / Self Care | Payer: 59 | Source: Ambulatory Visit | Attending: Family Medicine | Admitting: Family Medicine

## 2016-08-21 ENCOUNTER — Other Ambulatory Visit: Payer: Self-pay | Admitting: Family Medicine

## 2016-08-21 DIAGNOSIS — N898 Other specified noninflammatory disorders of vagina: Secondary | ICD-10-CM | POA: Insufficient documentation

## 2016-08-21 LAB — POC URINALSYSI DIPSTICK (AUTOMATED)
Bilirubin, UA: NEGATIVE
Blood, UA: NEGATIVE
Glucose, UA: NEGATIVE
KETONES UA: NEGATIVE
LEUKOCYTES UA: NEGATIVE — AB
Nitrite, UA: NEGATIVE
PH UA: 6 (ref 5.0–8.0)
UROBILINOGEN UA: NEGATIVE U/dL — AB

## 2016-08-21 NOTE — Telephone Encounter (Signed)
Patient would like urine ran.  She is having some vaginal discharge.  Urine ran and cytology sent per Dr. Etter Sjogren.

## 2016-08-23 LAB — URINE CYTOLOGY ANCILLARY ONLY
Chlamydia: NEGATIVE
Neisseria Gonorrhea: NEGATIVE
Trichomonas: NEGATIVE

## 2016-08-27 LAB — URINE CYTOLOGY ANCILLARY ONLY: Candida vaginitis: NEGATIVE

## 2016-08-28 ENCOUNTER — Telehealth: Payer: Self-pay

## 2016-08-28 DIAGNOSIS — B9689 Other specified bacterial agents as the cause of diseases classified elsewhere: Secondary | ICD-10-CM

## 2016-08-28 DIAGNOSIS — N760 Acute vaginitis: Secondary | ICD-10-CM

## 2016-08-28 MED ORDER — CLINDAMYCIN HCL 300 MG PO CAPS: ORAL_CAPSULE | ORAL | 0 refills | Status: DC

## 2016-08-28 MED ORDER — FLUCONAZOLE 150 MG PO TABS: 150.0000 mg | ORAL_TABLET | Freq: Once | ORAL | 1 refills | Status: AC

## 2016-08-28 MED FILL — CLINDAMYCIN HCL 300 MG CAPS: 300 | 7 days supply | Qty: 14 | Fill #0

## 2016-08-28 MED FILL — FLUCONAZOLE 150 MG TABLET: 150 | 1 days supply | Qty: 1 | Fill #0

## 2016-08-28 NOTE — Telephone Encounter (Signed)
Spoke with pt, pt state she understand results, and medication instructions. Patient request capsule instead of gel. Patient report gel form does not work. Patient no further questions. LB

## 2016-08-28 NOTE — Telephone Encounter (Signed)
-----   Message from Mosie Lukes, MD sent at 08/27/2016  9:50 PM EDT ----- Notify positive BV, needs rx for Cleocin gel apply pv qhs x 7 days.

## 2016-08-30 ENCOUNTER — Encounter (INDEPENDENT_AMBULATORY_CARE_PROVIDER_SITE_OTHER): Payer: Self-pay | Admitting: Family Medicine

## 2016-09-05 ENCOUNTER — Ambulatory Visit: Payer: Self-pay

## 2016-09-05 ENCOUNTER — Other Ambulatory Visit: Payer: Self-pay | Admitting: Occupational Medicine

## 2016-09-05 DIAGNOSIS — M25561 Pain in right knee: Secondary | ICD-10-CM

## 2016-09-10 ENCOUNTER — Telehealth: Payer: Self-pay

## 2016-09-10 MED ORDER — FLUCONAZOLE 150 MG PO TABS: 150.0000 mg | ORAL_TABLET | Freq: Once | ORAL | 0 refills | Status: AC

## 2016-09-10 MED FILL — FLUCONAZOLE 150 MG TABLET: 150 | 2 days supply | Qty: 2 | Fill #0

## 2016-09-10 NOTE — Telephone Encounter (Signed)
Sent rx to pharmacy. LB 

## 2016-09-18 ENCOUNTER — Other Ambulatory Visit (HOSPITAL_COMMUNITY): Payer: Self-pay | Admitting: Family Medicine

## 2016-09-18 DIAGNOSIS — M25561 Pain in right knee: Secondary | ICD-10-CM

## 2016-09-19 ENCOUNTER — Ambulatory Visit (HOSPITAL_COMMUNITY)
Admission: RE | Admit: 2016-09-19 | Discharge: 2016-09-19 | Disposition: A | Discharge status: Home / Self Care | Payer: PRIVATE HEALTH INSURANCE | Source: Ambulatory Visit | Attending: Family Medicine | Admitting: Family Medicine

## 2016-09-19 DIAGNOSIS — R600 Localized edema: Secondary | ICD-10-CM | POA: Diagnosis not present

## 2016-09-19 DIAGNOSIS — M25561 Pain in right knee: Secondary | ICD-10-CM | POA: Insufficient documentation

## 2016-09-19 DIAGNOSIS — S8991XA Unspecified injury of right lower leg, initial encounter: Secondary | ICD-10-CM | POA: Diagnosis not present

## 2016-09-25 ENCOUNTER — Ambulatory Visit (INDEPENDENT_AMBULATORY_CARE_PROVIDER_SITE_OTHER): Payer: 59 | Admitting: Family Medicine

## 2016-09-25 ENCOUNTER — Other Ambulatory Visit (HOSPITAL_COMMUNITY)
Admission: RE | Admit: 2016-09-25 | Discharge: 2016-09-25 | Disposition: A | Discharge status: Home / Self Care | Payer: 59 | Source: Ambulatory Visit | Attending: Family Medicine | Admitting: Family Medicine

## 2016-09-25 ENCOUNTER — Encounter: Payer: Self-pay | Admitting: Family Medicine

## 2016-09-25 VITALS — BP 118/74 | HR 83 | Temp 98.0°F | Ht 66.0 in | Wt 245.0 lb

## 2016-09-25 DIAGNOSIS — F419 Anxiety disorder, unspecified: Secondary | ICD-10-CM | POA: Diagnosis not present

## 2016-09-25 DIAGNOSIS — B373 Candidiasis of vulva and vagina: Secondary | ICD-10-CM

## 2016-09-25 DIAGNOSIS — B3731 Acute candidiasis of vulva and vagina: Secondary | ICD-10-CM

## 2016-09-25 LAB — POC URINALSYSI DIPSTICK (AUTOMATED)
Bilirubin, UA: NEGATIVE
Blood, UA: NEGATIVE
Glucose, UA: NEGATIVE
Ketones, UA: NEGATIVE
Leukocytes, UA: NEGATIVE
NITRITE UA: NEGATIVE
PH UA: 6 (ref 5.0–8.0)
PROTEIN UA: NEGATIVE
Spec Grav, UA: >=1.03 — AB (ref 1.010–1.025)
Urobilinogen, UA: 0.2 E.U./dL

## 2016-09-25 LAB — POCT URINE PREGNANCY: Preg Test, Ur: NEGATIVE

## 2016-09-25 MED ORDER — FLUCONAZOLE 150 MG PO TABS: 150.0000 mg | ORAL_TABLET | Freq: Once | ORAL | 3 refills | Status: DC

## 2016-09-25 NOTE — Progress Notes (Signed)
Pre visit review using our clinic review tool, if applicable. No additional management support is needed unless otherwise documented below in the visit note. 

## 2016-09-25 NOTE — Assessment & Plan Note (Signed)
Pt agreed to counseling She will let us know if she decides to try medication as well

## 2016-09-25 NOTE — Progress Notes (Signed)
Patient ID: Katherine Mcfarland, female   DOB: Apr 14, 1986, 31 y.o.   MRN: [id number]   Subjective:  I acted as a Education administrator for [employer], DO. Raiford Noble, [place]   Patient ID: Katherine Mcfarland, female    DOB: 10-13-1985, 31 y.o.   MRN: [id number]  Chief Complaint  Patient presents with  . Anxiety  . Vaginitis    HPI  Patient is in today for an acute visit. Patient has a Hx of migraines, palpations, ASCUS, and tinnitus of the right ear. Patient has no additional acute concerns noted at this time.  Patient Care Team: Carollee Herter, Alferd Apa, DO as PCP - General (Family Medicine)   Past Medical History:  Diagnosis Date  . Allergy    pt. reports seasonal allergies    Past Surgical History:  Procedure Laterality Date  . CESAREAN SECTION    . WISDOM TOOTH EXTRACTION      Family History  Problem Relation Age of Onset  . Diabetes Maternal Grandmother   . Hypertension Maternal Grandmother   . Hypertension Mother   . Hypertension Father     Social History   Social History  . Marital status: Single    Spouse name: N/A  . Number of children: N/A  . Years of education: N/A   Occupational History  . [employer]   Social History Main Topics  . Smoking status: Never Smoker  . Smokeless tobacco: Never Used  . Alcohol use Yes     Comment: social  . Drug use: No  . Sexual activity: Yes    Birth control/ protection: IUD   Other Topics Concern  . Not on file   Social History Narrative  . No narrative on file    Outpatient Medications Prior to Visit  Medication Sig Dispense Refill  . aspirin-acetaminophen-caffeine (EXCEDRIN MIGRAINE) 250-250-65 MG per tablet Take 2 tablets by mouth every 6 (six) hours as needed for headache or migraine.    . fluticasone (FLONASE) 50 MCG/ACT nasal spray Place 2 sprays into both nostrils daily. 16 g 6  . metoprolol succinate (TOPROL-XL) 25 MG 24 hr tablet Take 1 tablet (25 mg total) by mouth every evening. 90 tablet 3  . montelukast  (SINGULAIR) 10 MG tablet Take 1 tablet by mouth daily.  5  . SUMAtriptan (IMITREX) 100 MG tablet Take one tablet at onset of headache. May repeat in 2 hours. No more than 2 pills in 24 hours.   Follow-up with me on Monday to see how this is working. 10 tablet 0  . clindamycin (CLEOCIN) 300 MG capsule 1 tab po bid x 7days (Patient not taking: Reported on 09/25/2016) 14 capsule 0   No facility-administered medications prior to visit.     Allergies  Allergen Reactions  . Metronidazole Nausea And Vomiting  . Mushroom Extract Complex Diarrhea    Review of Systems  Constitutional: Negative for fever and malaise/fatigue.  HENT: Negative for congestion.   Eyes: Negative for blurred vision.  Respiratory: Negative for cough and shortness of breath.   Cardiovascular: Negative for chest pain, palpitations and leg swelling.  Gastrointestinal: Negative for vomiting.  Musculoskeletal: Negative for back pain.  Skin: Negative for rash.  Neurological: Negative for loss of consciousness and headaches.       Objective:    Physical Exam  Constitutional: She is oriented to person, place, and time. She appears well-developed and well-nourished. No distress.  HENT:  Head: Normocephalic and atraumatic.  Eyes: Conjunctivae are normal.  Neck:  Normal range of motion. No thyromegaly present.  Cardiovascular: Normal rate and regular rhythm.   Pulmonary/Chest: Effort normal and breath sounds normal. She has no wheezes.  Abdominal: Soft. Bowel sounds are normal. There is no tenderness.  Genitourinary: There is no rash, tenderness, lesion or injury on the right labia. There is no rash, tenderness, lesion or injury on the left labia. Cervix exhibits discharge. Cervix exhibits no friability. No erythema, tenderness or bleeding in the vagina. No foreign body in the vagina. No signs of injury around the vagina. Vaginal discharge found.  Musculoskeletal: She exhibits no edema or deformity.  Neurological: She is  alert and oriented to person, place, and time.  Skin: Skin is warm and dry. She is not diaphoretic.  Psychiatric: Her mood appears anxious. Her affect is angry.  Pt describes anger issues and anxiety.  She is unsure of reason but pt ex fiance is in prison until 2024.   She has not been with him for 9 years but the anxiety is getting worse and she is with someone new--- this anger / anxiety is really affecting the relationship  Nursing note and vitals reviewed.   BP 118/74 (BP Location: Left Arm, Patient Position: Sitting, Cuff Size: Large)   Pulse 83   Temp 98 F (36.7 C) (Oral)   Ht 5\' 6"  (1.676 m)   Wt 245 lb (111.1 kg)   LMP 09/05/2016   SpO2 98% Comment: RA  BMI 39.54 kg/m  Wt Readings from Last 3 Encounters:  09/25/16 245 lb (111.1 kg)  07/23/16 247 lb (112 kg)  07/11/16 248 lb 9.6 oz (112.8 kg)   BP Readings from Last 3 Encounters:  09/25/16 118/74  07/23/16 100/68  07/11/16 110/80     Immunization History  Administered Date(s) Administered  . Influenza-Unspecified 07/13/2015, 02/02/2016  . Td 11/29/2015    Health Maintenance  Topic Date Due  . INFLUENZA VACCINE  12/12/2016  . PAP SMEAR  10/13/2018  . TETANUS/TDAP  11/28/2025  . HIV Screening  Completed    Lab Results  Component Value Date   WBC 5.9 01/04/2016   HGB 12.7 01/04/2016   HCT 38.2 01/04/2016   PLT 319.0 01/04/2016   GLUCOSE 109 (H) 01/04/2016   CHOL 168 01/04/2016   TRIG 91.0 01/04/2016   HDL 39.00 (L) 01/04/2016   LDLCALC 110 (H) 01/04/2016   ALT 26 01/04/2016   AST 22 01/04/2016   NA 136 01/04/2016   K 3.9 01/04/2016   CL 104 01/04/2016   CREATININE 0.87 01/04/2016   BUN 13 01/04/2016   CO2 25 01/04/2016   TSH 1.10 02/09/2016    Lab Results  Component Value Date   TSH 1.10 02/09/2016   Lab Results  Component Value Date   WBC 5.9 01/04/2016   HGB 12.7 01/04/2016   HCT 38.2 01/04/2016   MCV 82.5 01/04/2016   PLT 319.0 01/04/2016   Lab Results  Component Value Date   NA 136  01/04/2016   K 3.9 01/04/2016   CO2 25 01/04/2016   GLUCOSE 109 (H) 01/04/2016   BUN 13 01/04/2016   CREATININE 0.87 01/04/2016   BILITOT 0.4 01/04/2016   ALKPHOS 68 01/04/2016   AST 22 01/04/2016   ALT 26 01/04/2016   PROT 7.3 01/04/2016   ALBUMIN 4.0 01/04/2016   CALCIUM 8.8 01/04/2016   GFR 96.74 01/04/2016   Lab Results  Component Value Date   CHOL 168 01/04/2016   Lab Results  Component Value Date   HDL 39.00 (  L) 01/04/2016   Lab Results  Component Value Date   LDLCALC 110 (H) 01/04/2016   Lab Results  Component Value Date   TRIG 91.0 01/04/2016   Lab Results  Component Value Date   CHOLHDL 4 01/04/2016   No results found for: HGBA1C       Assessment & Plan:   Problem List Items Addressed This Visit      Unprioritized   Anxiety - Primary    Pt agreed to counseling She will let us know if she decides to try medication as well      Relevant Orders   CBC with Differential/Platelet   TSH   Comprehensive metabolic panel   Ambulatory referral to Psychology    Other Visit Diagnoses    Vaginal yeast infection       Relevant Medications   fluconazole (DIFLUCAN) 150 MG tablet   Other Relevant Orders   POCT Urinalysis Dipstick (Automated) (Completed)   POCT urine pregnancy (Completed)   Cytology - PAP      I have discontinued Ms. Snider's clindamycin. I am also having her start on fluconazole. Additionally, I am having her maintain her aspirin-acetaminophen-caffeine, SUMAtriptan, fluticasone, metoprolol succinate, and montelukast.  Meds ordered this encounter  Medications  . fluconazole (DIFLUCAN) 150 MG tablet    Sig: Take 1 tablet (150 mg total) by mouth once. May repeat in 3 days prn    Dispense:  5 tablet    Refill:  3    CMA served as scribe during this visit. History, Physical and Plan performed by medical provider. Documentation and orders reviewed and attested to.  Ann Held, DO

## 2016-09-25 NOTE — Patient Instructions (Signed)

## 2016-09-26 ENCOUNTER — Encounter (INDEPENDENT_AMBULATORY_CARE_PROVIDER_SITE_OTHER): Payer: Self-pay | Admitting: Family Medicine

## 2016-09-26 ENCOUNTER — Ambulatory Visit (INDEPENDENT_AMBULATORY_CARE_PROVIDER_SITE_OTHER): Payer: 59 | Admitting: Family Medicine

## 2016-09-26 VITALS — BP 129/81 | HR 83 | Temp 98.1°F | Ht 66.0 in | Wt 241.0 lb

## 2016-09-26 DIAGNOSIS — R5383 Other fatigue: Secondary | ICD-10-CM

## 2016-09-26 DIAGNOSIS — Z9189 Other specified personal risk factors, not elsewhere classified: Secondary | ICD-10-CM | POA: Diagnosis not present

## 2016-09-26 DIAGNOSIS — Z6838 Body mass index (BMI) 38.0-38.9, adult: Secondary | ICD-10-CM

## 2016-09-26 DIAGNOSIS — Z1331 Encounter for screening for depression: Secondary | ICD-10-CM

## 2016-09-26 DIAGNOSIS — E669 Obesity, unspecified: Secondary | ICD-10-CM

## 2016-09-26 DIAGNOSIS — Z1389 Encounter for screening for other disorder: Secondary | ICD-10-CM

## 2016-09-26 DIAGNOSIS — Z0289 Encounter for other administrative examinations: Secondary | ICD-10-CM

## 2016-09-26 DIAGNOSIS — R002 Palpitations: Secondary | ICD-10-CM | POA: Diagnosis not present

## 2016-09-26 DIAGNOSIS — R0602 Shortness of breath: Secondary | ICD-10-CM | POA: Diagnosis not present

## 2016-09-26 NOTE — Progress Notes (Signed)
Office: [phone]  /  Fax: [phone]   Dear Dr. Charlett Mcfarland,   Thank you for referring Katherine Mcfarland to our clinic. The following note includes my evaluation and treatment recommendations.  HPI:   Chief Complaint: OBESITY  Katherine Mcfarland has been referred by Katherine Mcfarland A. Charlett Blake, MD for consultation regarding her obesity and obesity related comorbidities.  Katherine Mcfarland (MR# [id number]) is a 31 y.o. female who presents on 09/26/2016 for obesity evaluation and treatment. Current BMI is Body mass index is 38.9 kg/m.Katherine Mcfarland Katherine Mcfarland has struggled with obesity for years and has been unsuccessful in either losing weight or maintaining long term weight loss. Katherine Mcfarland attended our information session and states she is currently in the action stage of change and ready to dedicate time achieving and maintaining a healthier weight.  Katherine Mcfarland states her family eats meals together she thinks her family will eat healthier with  her her desired weight loss is 70 lbs she started gaining weight after last pregnancy her heaviest weight ever was 252 lbs. she skips meals frequently she is frequently drinking liquids with calories she frequently makes poor food choices she frequently eats larger portions than normal  she has binge eating behaviors she struggles with emotional eating    Fatigue Etna feels her energy is lower than it should be. This has worsened with weight gain and has not worsened recently. Katherine Mcfarland admits to daytime somnolence and  denies waking up still tired. Patient is at risk for obstructive sleep apnea. Patent has a history of symptoms of daytime fatigue and morning headache. Patient generally gets 7 hours of sleep per night, and states they generally have restful sleep. Snoring is not present. Apneic episodes are not present. Epworth Sleepiness Score is 3  Dyspnea on exertion Katherine Mcfarland notes increasing shortness of breath with exercising and seems to be worsening over time with  weight gain. She notes getting out of breath sooner with activity than she used to. This has not gotten worse recently. Katherine Mcfarland denies orthopnea.  Palpitations Katherine Mcfarland has history of occasional mild palpitations, lasting a few seconds, not currently. She denies chest pain and EKG is normal.  At risk for cardiovascular disease Katherine Mcfarland is at a higher than average risk for cardiovascular disease due to obesity. She currently denies any chest pain.  Depression Screen Katherine Mcfarland's Food and Mood (modified PHQ-9) score was  Depression screen PHQ 2/9 09/26/2016  Decreased Interest 1  Down, Depressed, Hopeless 2  PHQ - 2 Score 3  Altered sleeping 1  Tired, decreased energy 1  Change in appetite 1  Feeling bad or failure about yourself  2  Trouble concentrating 0  Moving slowly or fidgety/restless 0  Suicidal thoughts 0  PHQ-9 Score 8    ALLERGIES: Allergies  Allergen Reactions  . Metronidazole Nausea And Vomiting  . Mushroom Extract Complex Diarrhea    MEDICATIONS: Current Outpatient Prescriptions on File Prior to Visit  Medication Sig Dispense Refill  . aspirin-acetaminophen-caffeine (EXCEDRIN MIGRAINE) 250-250-65 MG per tablet Take 2 tablets by mouth every 6 (six) hours as needed for headache or migraine.    . metoprolol succinate (TOPROL-XL) 25 MG 24 hr tablet Take 1 tablet (25 mg total) by mouth every evening. 90 tablet 3  . montelukast (SINGULAIR) 10 MG tablet Take 1 tablet by mouth daily.  5  . SUMAtriptan (IMITREX) 100 MG tablet Take one tablet at onset of headache. May repeat in 2 hours. No more than 2 pills in 24 hours.   Follow-up with me on  Monday to see how this is working. 10 tablet 0  . fluticasone (FLONASE) 50 MCG/ACT nasal spray Place 2 sprays into both nostrils daily. (Patient not taking: Reported on 09/26/2016) 16 g 6   No current facility-administered medications on file prior to visit.     PAST MEDICAL HISTORY: Past Medical History:  Diagnosis Date  . Allergy     pt. reports seasonal allergies  . Headache   . Lactose intolerance   . Palpitations   . Right knee pain   . Tinnitus     PAST SURGICAL HISTORY: Past Surgical History:  Procedure Laterality Date  . CESAREAN SECTION    . WISDOM TOOTH EXTRACTION      SOCIAL HISTORY: Social History  Substance Use Topics  . Smoking status: Never Smoker  . Smokeless tobacco: Never Used  . Alcohol use Yes     Comment: social    FAMILY HISTORY: Family History  Problem Relation Age of Onset  . Diabetes Maternal Grandmother   . Hypertension Maternal Grandmother   . Hypertension Mother   . Hyperlipidemia Mother   . Hypertension Father     ROS: Review of Systems  HENT: Positive for tinnitus.   Respiratory: Positive for shortness of breath (with activity).   Cardiovascular: Positive for palpitations. Negative for chest pain and orthopnea.       Leg cramping  Musculoskeletal:       Muscle stiffness  Neurological: Positive for headaches.    PHYSICAL EXAM: Blood pressure 129/81, pulse 83, temperature 98.1 F (36.7 C), temperature source Oral, height 5\' 6"  (1.676 m), weight 241 lb (109.3 kg), last menstrual period 09/05/2016, SpO2 97 %. Body mass index is 38.9 kg/m. Physical Exam  Constitutional: She is oriented to person, place, and time. She appears well-developed and well-nourished.  Cardiovascular: Normal rate.   Murmur (early systolic 1/6) heard. Pulmonary/Chest: Effort normal.  Musculoskeletal: Normal range of motion.  Neurological: She is oriented to person, place, and time.  Skin: Skin is warm and dry.  Psychiatric: She has a normal mood and affect. Her behavior is normal.  Vitals reviewed.   RECENT LABS AND TESTS: BMET    Component Value Date/Time   NA 136 01/04/2016 0802   K 3.9 01/04/2016 0802   CL 104 01/04/2016 0802   CO2 25 01/04/2016 0802   GLUCOSE 109 (H) 01/04/2016 0802   BUN 13 01/04/2016 0802   CREATININE 0.87 01/04/2016 0802   CREATININE 0.86 10/26/2014  1502   CALCIUM 8.8 01/04/2016 0802   GFRNONAA >89 10/26/2014 1502   GFRAA >89 10/26/2014 1502   No results found for: HGBA1C No results found for: INSULIN CBC    Component Value Date/Time   WBC 5.9 01/04/2016 0802   RBC 4.63 01/04/2016 0802   HGB 12.7 01/04/2016 0802   HCT 38.2 01/04/2016 0802   PLT 319.0 01/04/2016 0802   MCV 82.5 01/04/2016 0802   MCH 27.0 10/26/2014 1502   MCHC 33.2 01/04/2016 0802   RDW 14.0 01/04/2016 0802   LYMPHSABS 3.0 01/04/2016 0802   MONOABS 0.4 01/04/2016 0802   EOSABS 0.3 01/04/2016 0802   BASOSABS 0.0 01/04/2016 0802   Iron/TIBC/Ferritin/ %Sat No results found for: IRON, TIBC, FERRITIN, IRONPCTSAT Lipid Panel     Component Value Date/Time   CHOL 168 01/04/2016 0802   TRIG 91.0 01/04/2016 0802   HDL 39.00 (L) 01/04/2016 0802   CHOLHDL 4 01/04/2016 0802   VLDL 18.2 01/04/2016 0802   LDLCALC 110 (H) 01/04/2016 0802   Hepatic  Function Panel     Component Value Date/Time   PROT 7.3 01/04/2016 0802   ALBUMIN 4.0 01/04/2016 0802   AST 22 01/04/2016 0802   ALT 26 01/04/2016 0802   ALKPHOS 68 01/04/2016 0802   BILITOT 0.4 01/04/2016 0802      Component Value Date/Time   TSH 1.10 02/09/2016 1127   TSH 1.73 01/04/2016 0802    ECG  shows NSR with a rate of 83 BPM INDIRECT CALORIMETER done today shows a VO2 of 286 and a REE of 1990.    ASSESSMENT AND PLAN: Other fatigue - Plan: EKG 12-Lead, Vitamin B12, CBC With Differential, Comprehensive metabolic panel, Folate, Hemoglobin A1c, Insulin, random, Lipid Panel With LDL/HDL Ratio, T3, T4, free, TSH, VITAMIN D 25 Hydroxy (Vit-D Deficiency, Fractures)  Shortness of breath on exertion  Palpitations - Plan: CBC With Differential  At risk for heart disease - Plan: Lipid Panel With LDL/HDL Ratio  Depression screening  Class 2 obesity without serious comorbidity with body mass index (BMI) of 38.0 to 38.9 in adult, unspecified obesity type  PLAN: Fatigue Yeraldy was informed that her  fatigue may be related to obesity, depression or many other causes. Labs will be ordered, and in the meanwhile Shanera has agreed to work on diet, exercise and weight loss to help with fatigue. Proper sleep hygiene was discussed including the need for 7-8 hours of quality sleep each night. A sleep study was not ordered based on symptoms and Epworth score.  Dyspnea on exertion Jonie's shortness of breath appears to be obesity related and exercise induced. She has agreed to work on weight loss and gradually increase exercise to treat her exercise induced shortness of breath. If Simona follows our instructions and loses weight without improvement of her shortness of breath, we will plan to refer to pulmonology. We will monitor this condition regularly. Monisha agrees to this plan.  Palpitations Based on age and risk, it is unlikely to be cardiac and she has a had a full workup including 30 day Holter monitor and echocardiogram. We will continue to monitor and follow. Tessica agrees to follow up with our clinic in 2 weeks.  Cardiovascular risk counselling Mally was given extended (at least 15 minutes) coronary artery disease prevention counseling today. She is 31 y.o. female and has risk factors for heart disease including obesity. We discussed intensive lifestyle modifications today with an emphasis on specific weight loss instructions and strategies. Pt was also informed of the importance of increasing exercise and decreasing saturated fats to help prevent heart disease.  Depression Screen Zhuri had a mildly positive depression screening. Depression is commonly associated with obesity and often results in emotional eating behaviors. We will monitor this closely and work on CBT to help improve the non-hunger eating patterns. Referral to Psychology may be required if no improvement is seen as she continues in our clinic.  Obesity Alfretta is currently in the action stage of change and her  goal is to continue with weight loss efforts. I recommend Cianna begin the structured treatment plan as follows:  She has agreed to follow the Category 3 plan Jim has been instructed to eventually work up to a goal of 150 minutes of combined cardio and strengthening exercise per week for weight loss and overall health benefits. We discussed the following Behavioral Modification Stratagies today: increasing lean protein intake and decreasing simple carbohydrates   Latica has agreed to join our obesity program and follow up with our clinic in 2 weeks. She was  informed of the importance of frequent follow up visits to maximize her success with intensive lifestyle modifications for her multiple health conditions. She was informed we would discuss her lab results at her next visit unless there is a critical issue that needs to be addressed sooner. Emri agreed to keep her next visit at the agreed upon time to discuss these results.  I, Doreene Nest, am acting as scribe for Dennard Nip, MD  I have reviewed the above documentation for accuracy and completeness, and I agree with the above. -Dennard Nip, MD

## 2016-09-27 LAB — COMPREHENSIVE METABOLIC PANEL
ALT: 19 IU/L (ref 0–32)
AST: 20 IU/L (ref 0–40)
Albumin/Globulin Ratio: 1.3 (ref 1.2–2.2)
Albumin: 4 g/dL (ref 3.5–5.5)
Alkaline Phosphatase: 73 IU/L (ref 39–117)
BUN / CREAT RATIO: 14 (ref 9–23)
BUN: 12 mg/dL (ref 6–20)
Bilirubin Total: 0.3 mg/dL (ref 0.0–1.2)
CALCIUM: 9.4 mg/dL (ref 8.7–10.2)
CHLORIDE: 101 mmol/L (ref 96–106)
CO2: 23 mmol/L (ref 18–29)
Creatinine, Ser: 0.85 mg/dL (ref 0.57–1.00)
GFR calc non Af Amer: 90 mL/min/{1.73_m2} (ref >59)
GFR, EST AFRICAN AMERICAN: 104 mL/min/{1.73_m2} (ref >59)
Globulin, Total: 3.2 g/dL (ref 1.5–4.5)
Glucose: 114 mg/dL — ABNORMAL HIGH (ref 65–99)
Potassium: 4.1 mmol/L (ref 3.5–5.2)
SODIUM: 140 mmol/L (ref 134–144)
Total Protein: 7.2 g/dL (ref 6.0–8.5)

## 2016-09-27 LAB — CYTOLOGY - PAP
BACTERIAL VAGINITIS: NEGATIVE
CANDIDA VAGINITIS: POSITIVE — AB
Chlamydia: NEGATIVE
Diagnosis: NEGATIVE
HPV (WINDOPATH): NOT DETECTED
NEISSERIA GONORRHEA: NEGATIVE
TRICH (WINDOWPATH): NEGATIVE

## 2016-09-27 LAB — LIPID PANEL WITH LDL/HDL RATIO
Cholesterol, Total: 171 mg/dL (ref 100–199)
HDL: 45 mg/dL (ref >39)
LDL CALC: 107 mg/dL — AB (ref 0–99)
LDl/HDL Ratio: 2.4 ratio (ref 0.0–3.2)
Triglycerides: 96 mg/dL (ref 0–149)
VLDL CHOLESTEROL CAL: 19 mg/dL (ref 5–40)

## 2016-09-27 LAB — T3: T3, Total: 133 ng/dL (ref 71–180)

## 2016-09-27 LAB — T4, FREE: Free T4: 1.34 ng/dL (ref 0.82–1.77)

## 2016-09-27 LAB — CBC WITH DIFFERENTIAL
Basophils Absolute: 0 10*3/uL (ref 0.0–0.2)
Basos: 0 %
EOS (ABSOLUTE): 0.3 10*3/uL (ref 0.0–0.4)
EOS: 5 %
Hematocrit: 39.5 % (ref 34.0–46.6)
Hemoglobin: 12.5 g/dL (ref 11.1–15.9)
IMMATURE GRANULOCYTES: 0 %
Immature Grans (Abs): 0 10*3/uL (ref 0.0–0.1)
Lymphocytes Absolute: 2.1 10*3/uL (ref 0.7–3.1)
Lymphs: 39 %
MCH: 26.2 pg — ABNORMAL LOW (ref 26.6–33.0)
MCHC: 31.6 g/dL (ref 31.5–35.7)
MCV: 83 fL (ref 79–97)
MONOS ABS: 0.3 10*3/uL (ref 0.1–0.9)
Monocytes: 6 %
NEUTROS PCT: 50 %
Neutrophils Absolute: 2.7 10*3/uL (ref 1.4–7.0)
RBC: 4.77 x10E6/uL (ref 3.77–5.28)
RDW: 13.7 % (ref 12.3–15.4)
WBC: 5.5 10*3/uL (ref 3.4–10.8)

## 2016-09-27 LAB — TSH: TSH: 1.15 u[IU]/mL (ref 0.450–4.500)

## 2016-09-27 LAB — VITAMIN D 25 HYDROXY (VIT D DEFICIENCY, FRACTURES): Vit D, 25-Hydroxy: 12.7 ng/mL — ABNORMAL LOW (ref 30.0–100.0)

## 2016-09-27 LAB — HEMOGLOBIN A1C
Est. average glucose Bld gHb Est-mCnc: 128 mg/dL
Hgb A1c MFr Bld: 6.1 % — ABNORMAL HIGH (ref 4.8–5.6)

## 2016-09-27 LAB — FOLATE: Folate: 10.9 ng/mL (ref >3.0)

## 2016-09-27 LAB — VITAMIN B12: VITAMIN B 12: 654 pg/mL (ref 232–1245)

## 2016-09-27 LAB — INSULIN, RANDOM: INSULIN: 23.3 u[IU]/mL (ref 2.6–24.9)

## 2016-10-04 MED FILL — DICLOFENAC SOD 75 MG TAB EC: 75 | 30 days supply | Qty: 60 | Fill #0

## 2016-10-10 ENCOUNTER — Ambulatory Visit: Payer: PRIVATE HEALTH INSURANCE | Attending: Sports Medicine | Admitting: Physical Therapy

## 2016-10-10 ENCOUNTER — Ambulatory Visit (INDEPENDENT_AMBULATORY_CARE_PROVIDER_SITE_OTHER): Payer: 59 | Admitting: Family Medicine

## 2016-10-10 VITALS — BP 122/80 | HR 88 | Temp 98.2°F | Ht 66.0 in | Wt 238.0 lb

## 2016-10-10 DIAGNOSIS — R7303 Prediabetes: Secondary | ICD-10-CM

## 2016-10-10 DIAGNOSIS — Z9189 Other specified personal risk factors, not elsewhere classified: Secondary | ICD-10-CM | POA: Diagnosis not present

## 2016-10-10 DIAGNOSIS — M25561 Pain in right knee: Secondary | ICD-10-CM

## 2016-10-10 DIAGNOSIS — M25661 Stiffness of right knee, not elsewhere classified: Secondary | ICD-10-CM | POA: Diagnosis present

## 2016-10-10 DIAGNOSIS — M25671 Stiffness of right ankle, not elsewhere classified: Secondary | ICD-10-CM | POA: Diagnosis present

## 2016-10-10 DIAGNOSIS — M25571 Pain in right ankle and joints of right foot: Secondary | ICD-10-CM

## 2016-10-10 DIAGNOSIS — R262 Difficulty in walking, not elsewhere classified: Secondary | ICD-10-CM | POA: Diagnosis present

## 2016-10-10 DIAGNOSIS — E559 Vitamin D deficiency, unspecified: Secondary | ICD-10-CM

## 2016-10-10 DIAGNOSIS — Z6838 Body mass index (BMI) 38.0-38.9, adult: Secondary | ICD-10-CM | POA: Diagnosis not present

## 2016-10-10 DIAGNOSIS — E669 Obesity, unspecified: Secondary | ICD-10-CM | POA: Diagnosis not present

## 2016-10-10 MED ORDER — VITAMIN D (ERGOCALCIFEROL) 1.25 MG (50000 UNIT) PO CAPS: 50000.0000 [IU] | ORAL_CAPSULE | ORAL | 0 refills | Status: DC

## 2016-10-10 MED ORDER — METFORMIN HCL 500 MG PO TABS: 500.0000 mg | ORAL_TABLET | Freq: Two times a day (BID) | ORAL | 3 refills | Status: DC

## 2016-10-10 MED FILL — metFORMIN HCL 500 MG TABS: 500 | 90 days supply | Qty: 180 | Fill #0

## 2016-10-10 MED FILL — VIT D2 1.25 MG (50,000 UNIT: 1.25 MG | 28 days supply | Qty: 4 | Fill #0

## 2016-10-10 NOTE — Therapy (Signed)
[facility] [address]  [place], [place], [address] Phone: [phone]   Fax:  [phone]  Physical Therapy Evaluation  Patient Details  Name: Katherine Mcfarland MRN: [id number] Date of Birth: 18-Jul-1985 Referring Provider: Pedro Earls, MD  Encounter Date: 10/10/2016      PT End of Session - 10/10/16 0915    Visit Number 1   Number of Visits 9   Date for PT Re-Evaluation 11/16/16   Authorization Type WC   Authorization - Number of Visits 9   PT Start Time 0915   PT Stop Time 1008   PT Time Calculation (min) 53 min   Activity Tolerance Patient tolerated treatment well   Behavior During Therapy [facility] for tasks assessed/performed      Past Medical History:  Diagnosis Date  . Allergy    pt. reports seasonal allergies  . Headache   . Lactose intolerance   . Palpitations   . Right knee pain   . Tinnitus     Past Surgical History:  Procedure Laterality Date  . CESAREAN SECTION    . WISDOM TOOTH EXTRACTION      There were no vitals filed for this visit.       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 Injured her back, R knee and ankle.   Pertinent History R knee bursitis ~4 yrs ago   Limitations Sitting;Standing;Walking   How long can you sit comfortably? <30 minutes   How long can you stand comfortably? <30 minutes   How long can you walk comfortably? <30 minutes   Patient Stated Goals "to relieve pressure and pain so I can walk better"   Currently in Pain? Yes   Pain Score 3   least 3/10, avg 5/10, worst 10/10   Pain Location Knee   Pain Orientation Right   Pain Descriptors / Indicators Tightness;Aching   Pain Type Acute pain   Pain Radiating Towards up anterior thigh    Pain Onset More than a month ago  end of March 2017   Pain Frequency Constant   Aggravating Factors  prolonged sitting or standing, walking   Pain Relieving Factors nothing   Effect of Pain on Daily Activities "pushes through pain", difficulty on stairs   Multiple Pain Sites Yes   Pain Score 2  least 2/10, avg 6/10, worst 10/10   Pain Location Ankle   Pain Orientation Right   Pain Descriptors / Indicators Aching   Pain Type Acute pain   Pain Radiating Towards n/a   Pain Onset More than a month ago  end of March   Pain Frequency Constant   Aggravating Factors  walking   Pain Relieving Factors ibuprofen or tylenol, elevation   Effect of Pain on Daily Activities limps due to pain            [facility] PT Assessment - 10/10/16 0915      Assessment   Medical Diagnosis R ankle sprain/pain, R knee pain   Referring Provider Pedro Earls, MD   Onset Date/Surgical Date --  end of March   Next MD Visit ~4 wks   Prior Therapy none     Precautions   Required Braces or Orthoses Other Brace/Splint   Other Brace/Splint soft velcro ankle brace PRN     Balance Screen  Has the patient fallen in the past 6 months Yes   How many times? 1  at time of injury   Has the patient had a decrease in activity level because of a fear of falling?  No   Is the patient reluctant to leave their home because of a fear of falling?  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, standing, walking, assisting pts   Leisure keeping up with 4 kids; slow pace walking around the block daily     Observation/Other Assessments   Focus on Therapeutic Outcomes (FOTO)  Ankle 64% (36% limitation); predicted 73% (27% limitation)     ROM / Strength   AROM / PROM / Strength AROM;Strength     AROM   AROM Assessment Site  Knee;Ankle   Right/Left Knee Right;Left   Right Knee Extension 4  LAQ, 0 dg supported   Right Knee Flexion 121   Left Knee Extension -3   Left Knee Flexion 127   Right/Left Ankle Right;Left   Right Ankle Dorsiflexion 6   Right Ankle Plantar Flexion 41   Right Ankle Inversion 25   Right Ankle Eversion 17   Left Ankle Dorsiflexion 10   Left Ankle Plantar Flexion 39   Left Ankle Inversion 22   Left Ankle Eversion 24     Strength   Strength Assessment Site Hip;Knee;Ankle   Right/Left Hip Right;Left   Right Hip Flexion 3+/5   Right Hip Extension 4-/5   Right Hip External Rotation  4-/5   Right Hip Internal Rotation 4-/5   Right Hip ABduction 4-/5   Right Hip ADduction 4-/5   Left Hip Flexion 4-/5   Left Hip Extension 4/5   Left Hip External Rotation 4/5   Left Hip Internal Rotation 4+/5   Left Hip ABduction 4+/5   Left Hip ADduction 4/5   Right/Left Knee Right;Left   Right Knee Flexion 4-/5   Right Knee Extension 4-/5   Left Knee Flexion 4+/5   Left Knee Extension 5/5   Right/Left Ankle Right;Left   Right Ankle Dorsiflexion 4-/5   Right Ankle Plantar Flexion 4/5   Right Ankle Inversion 3+/5   Right Ankle Eversion 4-/5   Left Ankle Dorsiflexion 4+/5   Left Ankle Plantar Flexion 5/5   Left Ankle Inversion 4+/5   Left Ankle Eversion 4+/5     Flexibility   Soft Tissue Assessment /Muscle Length yes   Hamstrings tight on R   Quadriceps tight on R, esp RF   ITB tight on R   Piriformis tight B     Ambulation/Gait   Gait Comments Pt reports limp on R when pain at it's worst.            Objective measurements completed on examination: See above findings.          [facility] Adult PT Treatment/Exercise - 10/10/16 0915      Exercises   Exercises Knee/Hip;Ankle     Knee/Hip Exercises: Stretches   Passive Hamstring Stretch Right;30 seconds;1 rep   Passive Hamstring Stretch Limitations supine with strap   Quad Stretch Right;30 seconds;1 rep   Quad Stretch  Limitations prone with strap  ITB Stretch Right;30 seconds;1 rep   ITB Stretch Limitations supine with strap   Gastroc Stretch Right;30 seconds;1 rep   Gastroc Stretch Limitations standing   Soleus Stretch Right;30 seconds;1 rep   Soleus Stretch Limitations standing                PT Education - 10/10/16 1008    Education provided Yes   Education Details PT eval findings, anticipated POC & initial HEP   Person(s) Educated Patient   Methods Explanation;Demonstration;Handout   Comprehension Verbalized understanding;Returned demonstration;Need further instruction             PT Long Term Goals - 10/10/16 1022      PT LONG TERM GOAL #1   Title Independent with HEP by 11/16/16   Status New     PT LONG TERM GOAL #2   Title R ankle DF AROM >/= 10 dg and eversion AROM >/= 20 dg w/o increased pain by 11/16/16   Status New     PT LONG TERM GOAL #3   Title R knee AROM equivalent to L knee w/o increased pain by 11/16/16   Status New     PT LONG TERM GOAL #4   Title R LE strength >/= 4/5 to 4+/5 by 11/16/16   Status New     PT LONG TERM GOAL #5   Title Pt will report ability to complete household chores, job tasks and normal recreational activities w/o limitation due to R knee or ankle pain by 11/16/16   Status New                Plan - 10/10/16 1010    Clinical Impression Statement Katherine Mcfarland is a 31 y/o female who presents to OP PT for R knee and ankle pain resulting from a work-related 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 Mild limitation noted in R knee and ankle AROM with decreased flexibility present throughout R proximal LE musculature. Overall R LE strength at least  grade weaker than L LE. Pain limits sitting, standing and walking tolerance, causing her to limp at time, and interferes with job performance at times. Patient demonstrates good potential to  benefit from skilled PT to address strength, ROM, and gait deviations to allow for improved functional mobility and return to normal work and recreational activities w/o limitation due to pain.   History and Personal Factors relevant to plan of care: h/o R knee bursitis ~4 yrs ago, obesity   Clinical Presentation Evolving   Clinical Presentation due to: Ongoing pain in mutiple locations (low back, R knee & ankle) 2 months after initial injury   Clinical Decision Making Moderate   Rehab Potential Good   PT Frequency 2x / week   PT Duration 4 weeks   PT Treatment/Interventions Patient/family education;Therapeutic exercise;Neuromuscular re-education;Balance training;Gait training;Stair training;Manual techniques;Taping;Dry needling;Electrical Stimulation;Cryotherapy;Vasopneumatic Device;Moist Heat;Iontophoresis 4mg /ml Dexamethasone;ADLs/Self Care Home Management   PT Next Visit Plan Review initial HEP; LE/core strengthening; manual therapy including taping as indicated; modalities PRN   Consulted and Agree with Plan of Care Patient      Patient will benefit from skilled therapeutic intervention in order to improve the following deficits and impairments:  Pain, Impaired flexibility, Increased muscle spasms, Decreased range of motion, Decreased strength, Difficulty walking, Abnormal gait, Decreased activity tolerance  Visit Diagnosis: Pain in right ankle and joints of right foot  Acute  pain of right knee  Stiffness of right ankle, not elsewhere classified  Stiffness of right knee, not elsewhere classified  Difficulty in walking, not elsewhere classified     Problem List Patient Active Problem List   Diagnosis Date Noted  . Anxiety 09/25/2016  . Palpitations 02/07/2016  . Severe obesity (BMI >= 40) ([place]) 02/07/2016  . Tinnitus of right ear 01/25/2016  . Migraine 10/26/2014  . Atypical squamous cell changes of undetermined significance (ASCUS) on cervical cytology with positive high  risk human papilloma virus (HPV) 11/03/2013    Percival Spanish, PT, MPT 10/10/2016, 11:38 AM  [facility] [address]  [place], [place], [address] Phone: [phone]   Fax:  [phone]  Name: Katherine Mcfarland MRN: [id number] Date of Birth: 12-Dec-1985

## 2016-10-11 NOTE — Progress Notes (Signed)
Office: [phone]  /  Fax: [phone]   HPI:   Chief Complaint: OBESITY Katherine Mcfarland is here to discuss her progress with her obesity treatment plan. She is on the  follow the Category 3 plan and is following her eating plan approximately 75 % of the time. She states she is walking 15 to 20 minutes 3 times per week. Alayshia has done well with weight loss but struggled to eat all her protein. She notes workplace sabotage and increased celebration eating over [employer]. Her husband is encouraging her to eat better but in a way that is non-productive. Her weight is 238 lb (108 kg) today and has had a weight loss of 3 pounds over a period of 2 weeks since her last visit. She has lost 3 lbs since starting treatment with [place].  Vitamin D deficiency Malaney has a new diagnosis of vitamin D deficiency, very low at 12.7. She is not currently taking vit D and denies nausea, vomiting or muscle weakness.  Pre-Diabetes Morene has a new diagnosis of pre-diabetes based on her elevated fasting glucose at 114, insulin at 23.3 and Hgb A1c at 6.1 She was informed this puts her at greater risk of developing diabetes. She is not taking metformin currently and continues to work on diet and exercise to decrease risk of diabetes. She denies nausea or hypoglycemia.  At risk for diabetes Malgorzata is at higher than average risk for developing diabetes due to her obesity and pre-diabetes. She currently denies polyuria or polydipsia.  ALLERGIES: Allergies  Allergen Reactions   Metronidazole Nausea And Vomiting   Mushroom Extract Complex Diarrhea    MEDICATIONS: Current Outpatient Prescriptions on File Prior to Visit  Medication Sig Dispense Refill   aspirin-acetaminophen-caffeine (EXCEDRIN MIGRAINE) 250-250-65 MG per tablet Take 2 tablets by mouth every 6 (six) hours as needed for headache or migraine.     fluticasone (FLONASE) 50 MCG/ACT nasal spray Place 2 sprays into both nostrils daily. 16 g 6    levocetirizine (XYZAL) 5 MG tablet Take 5 mg by mouth every evening. Take 10mg  prn     metoprolol succinate (TOPROL-XL) 25 MG 24 hr tablet Take 1 tablet (25 mg total) by mouth every evening. 90 tablet 3   montelukast (SINGULAIR) 10 MG tablet Take 1 tablet by mouth daily.  5   SUMAtriptan (IMITREX) 100 MG tablet Take one tablet at onset of headache. May repeat in 2 hours. No more than 2 pills in 24 hours.   Follow-up with me on Monday to see how this is working. 10 tablet 0   No current facility-administered medications on file prior to visit.     PAST MEDICAL HISTORY: Past Medical History:  Diagnosis Date   Allergy    pt. reports seasonal allergies   Headache    Lactose intolerance    Palpitations    Right knee pain    Tinnitus     PAST SURGICAL HISTORY: Past Surgical History:  Procedure Laterality Date   CESAREAN SECTION     WISDOM TOOTH EXTRACTION      SOCIAL HISTORY: Social History  Substance Use Topics   Smoking status: Never Smoker   Smokeless tobacco: Never Used   Alcohol use Yes     Comment: social    FAMILY HISTORY: Family History  Problem Relation Age of Onset   Diabetes Maternal Grandmother    Hypertension Maternal Grandmother    Hypertension Mother    Hyperlipidemia Mother    Hypertension Father     ROS:  Review of Systems  Constitutional: Positive for weight loss.  Gastrointestinal: Negative for nausea and vomiting.  Genitourinary: Negative for frequency.  Musculoskeletal:       Negative muscle weakness  Endo/Heme/Allergies: Negative for polydipsia.       Negative hypoglycemia    PHYSICAL EXAM: Blood pressure 122/80, pulse 88, temperature 98.2 F (36.8 C), temperature source Oral, height 5\' 6"  (1.676 m), weight 238 lb (108 kg), SpO2 98 %. Body mass index is 38.41 kg/m. Physical Exam  Constitutional: She is oriented to person, place, and time. She appears well-developed and well-nourished.  Cardiovascular: Normal rate.     Pulmonary/Chest: Effort normal.  Musculoskeletal: Normal range of motion.  Neurological: She is oriented to person, place, and time.  Skin: Skin is warm and dry.  Psychiatric: She has a normal mood and affect. Her behavior is normal.  Vitals reviewed.   RECENT LABS AND TESTS: BMET    Component Value Date/Time   NA 140 09/26/2016 0946   K 4.1 09/26/2016 0946   CL 101 09/26/2016 0946   CO2 23 09/26/2016 0946   GLUCOSE 114 (H) 09/26/2016 0946   GLUCOSE 109 (H) 01/04/2016 0802   BUN 12 09/26/2016 0946   CREATININE 0.85 09/26/2016 0946   CREATININE 0.86 10/26/2014 1502   CALCIUM 9.4 09/26/2016 0946   GFRNONAA 90 09/26/2016 0946   GFRNONAA >89 10/26/2014 1502   GFRAA 104 09/26/2016 0946   GFRAA >89 10/26/2014 1502   Lab Results  Component Value Date   HGBA1C 6.1 (H) 09/26/2016   Lab Results  Component Value Date   INSULIN 23.3 09/26/2016   CBC    Component Value Date/Time   WBC 5.5 09/26/2016 0946   WBC 5.9 01/04/2016 0802   RBC 4.77 09/26/2016 0946   RBC 4.63 01/04/2016 0802   HGB 12.7 01/04/2016 0802   HCT 39.5 09/26/2016 0946   PLT 319.0 01/04/2016 0802   MCV 83 09/26/2016 0946   MCH 26.2 (L) 09/26/2016 0946   MCH 27.0 10/26/2014 1502   MCHC 31.6 09/26/2016 0946   MCHC 33.2 01/04/2016 0802   RDW 13.7 09/26/2016 0946   LYMPHSABS 2.1 09/26/2016 0946   MONOABS 0.4 01/04/2016 0802   EOSABS 0.3 09/26/2016 0946   BASOSABS 0.0 09/26/2016 0946   Iron/TIBC/Ferritin/ %Sat No results found for: IRON, TIBC, FERRITIN, IRONPCTSAT Lipid Panel     Component Value Date/Time   CHOL 171 09/26/2016 0946   TRIG 96 09/26/2016 0946   HDL 45 09/26/2016 0946   CHOLHDL 4 01/04/2016 0802   VLDL 18.2 01/04/2016 0802   LDLCALC 107 (H) 09/26/2016 0946   Hepatic Function Panel     Component Value Date/Time   PROT 7.2 09/26/2016 0946   ALBUMIN 4.0 09/26/2016 0946   AST 20 09/26/2016 0946   ALT 19 09/26/2016 0946   ALKPHOS 73 09/26/2016 0946   BILITOT 0.3 09/26/2016 0946       Component Value Date/Time   TSH 1.150 09/26/2016 0946   TSH 1.10 02/09/2016 1127   TSH 1.73 01/04/2016 0802    ASSESSMENT AND PLAN: Vitamin D deficiency - Plan: Vitamin D, Ergocalciferol, (DRISDOL) 50000 units CAPS capsule  Prediabetes - Plan: metFORMIN (GLUCOPHAGE) 500 MG tablet  At risk for diabetes mellitus  Class 2 obesity without serious comorbidity with body mass index (BMI) of 38.0 to 38.9 in adult, unspecified obesity type  PLAN:  Vitamin D Deficiency Keyera was informed that low vitamin D levels contributes to fatigue and are associated with obesity, breast, and colon  cancer. She agrees to start to take prescription Vit D @50 ,000 IU every week #4 with no refills and will follow up for routine testing of vitamin D, at least 2-3 times per year. She was informed of the risk of over-replacement of vitamin D and agrees to not increase her dose unless he discusses this with [place] first. Tyneka agrees to follow up with our clinic in 2 weeks.  Pre-Diabetes Jamy will continue to work on weight loss, exercise, and decreasing simple carbohydrates in her diet to help decrease the risk of diabetes. We dicussed metformin including benefits and risks. She was informed that eating too many simple carbohydrates or too many calories at one sitting increases the likelihood of GI side effects. Azadeh requested metformin for now and a prescription was written today for metformin 500 mg every morning #30 with no refills. Wealthy agreed to follow up with [place] as directed to monitor her progress.  Diabetes risk counselling Jillien was given extended (at least 30 minutes) diabetes prevention counseling today. She is 31 y.o. female and has risk factors for diabetes including obesity and pre-diabetes. We discussed intensive lifestyle modifications today with an emphasis on weight loss as well as increasing exercise and decreasing simple carbohydrates in her diet.  Obesity Naviyah is  currently in the action stage of change. As such, her goal is to continue with weight loss efforts She has agreed to follow the Category 3 plan Halaina has been instructed to work up to a goal of 150 minutes of combined cardio and strengthening exercise per week for weight loss and overall health benefits. We discussed the following Behavioral Modification Strategies today: increasing lean protein intake, decrease eating out, work on meal planning and easy cooking plans and dealing with family or coworker sabotage  Verleen has agreed to follow up with our clinic in 2 weeks. She was informed of the importance of frequent follow up visits to maximize her success with intensive lifestyle modifications for her multiple health conditions.  I, Doreene Nest, am acting as scribe for Dennard Nip, MD  I have reviewed the above documentation for accuracy and completeness, and I agree with the above. -Dennard Nip, MD  OBESITY BEHAVIORAL INTERVENTION VISIT  Today's visit was # 2 out of 55.  Starting weight: 241 lbs Starting date: 09/26/16 Today's weight : 238 lbs  Today's date: 10/10/2016 Total lbs lost to date: 3 (Patients must lose 7 lbs in the first 6 months to continue with counseling)   ASK: We discussed the diagnosis of obesity with [name] today and Charnice agreed to give [place] permission to discuss obesity behavioral modification therapy today.  ASSESS: Sary has the diagnosis of obesity and her BMI today is 68.5 Deslyn is in the action stage of change   ADVISE: Lonnetta was educated on the multiple health risks of obesity as well as the benefit of weight loss to improve her health. She was advised of the need for long term treatment and the importance of lifestyle modifications.  AGREE: Multiple dietary modification options and treatment options were discussed and  Yukiko agreed to follow the Category 3 plan We discussed the following Behavioral Modification  Strategies today: increasing lean protein intake, decrease eating out, work on meal planning and easy cooking plans and dealing with family or coworker sabotage

## 2016-10-12 ENCOUNTER — Encounter (INDEPENDENT_AMBULATORY_CARE_PROVIDER_SITE_OTHER): Payer: Self-pay | Admitting: Family Medicine

## 2016-10-17 ENCOUNTER — Ambulatory Visit: Payer: PRIVATE HEALTH INSURANCE | Attending: Sports Medicine | Admitting: Physical Therapy

## 2016-10-17 DIAGNOSIS — M25671 Stiffness of right ankle, not elsewhere classified: Secondary | ICD-10-CM | POA: Diagnosis present

## 2016-10-17 DIAGNOSIS — M25571 Pain in right ankle and joints of right foot: Secondary | ICD-10-CM | POA: Diagnosis present

## 2016-10-17 DIAGNOSIS — M25561 Pain in right knee: Secondary | ICD-10-CM

## 2016-10-17 DIAGNOSIS — M25661 Stiffness of right knee, not elsewhere classified: Secondary | ICD-10-CM | POA: Diagnosis present

## 2016-10-17 DIAGNOSIS — R262 Difficulty in walking, not elsewhere classified: Secondary | ICD-10-CM

## 2016-10-17 NOTE — Therapy (Signed)
[facility] [address]  [place], [place], [address] Phone: [phone]   Fax:  [phone]  Physical Therapy Treatment  Patient Details  Name: Katherine Mcfarland MRN: [id number] Date of Birth: 10/20/1985 Referring Provider: Pedro Earls, MD  Encounter Date: 10/17/2016      PT End of Session - 10/17/16 1106    Visit Number 2   Number of Visits 9   Date for PT Re-Evaluation 11/16/16   Authorization Type WC   Authorization - Number of Visits 9   PT Start Time 1106   PT Stop Time 1152   PT Time Calculation (min) 46 min   Activity Tolerance Patient tolerated treatment well   Behavior During Therapy [facility] for tasks assessed/performed      Past Medical History:  Diagnosis Date  . Allergy    pt. reports seasonal allergies  . Headache   . Lactose intolerance   . Palpitations   . Right knee pain   . Tinnitus     Past Surgical History:  Procedure Laterality Date  . CESAREAN SECTION    . WISDOM TOOTH EXTRACTION      There were no vitals filed for this visit.      Subjective Assessment - 10/17/16 1117    Subjective Pt reports only able to complete supine stretches 1x since eval, but does standing gastroc/soleus stretches mutiple times per day at work.   Pertinent History R knee bursitis ~4 yrs ago   Patient Stated Goals "to relieve pressure and pain so I can walk better"   Currently in Pain? Yes   Pain Score 2    Pain Location Knee   Pain Orientation Right   Pain Score 1   Pain Location Ankle   Pain Orientation Right                         [facility] Adult PT Treatment/Exercise - 10/17/16 1106      Knee/Hip Exercises: Stretches   Passive Hamstring Stretch Right;30 seconds;1 rep   Passive Hamstring Stretch Limitations supine with strap   Quad Stretch Right;30 seconds;1 rep   Sports administrator Limitations prone with strap   ITB Stretch Right;30 seconds;1 rep   ITB Stretch Limitations  supine with strap   Gastroc Stretch Right;30 seconds;1 rep   Gastroc Stretch Limitations standing   Soleus Stretch Right;30 seconds;1 rep   Soleus Stretch Limitations standing   Other Knee/Hip Stretches foam rolling to quads 3 way (toes down, pointed in & out) x 1 minute each     Knee/Hip Exercises: Aerobic   Recumbent Bike L1 x 6'     Knee/Hip Exercises: Seated   Long Arc Quad Right;15 reps   Long Arc Quad Limitations + hip ADD ball squeeze     Knee/Hip Exercises: Supine   Bridges with [employer] Both;10 reps  5" hold   Straight Leg Raise with External Rotation Right;15 reps  5" hold     Ankle Exercises: Seated   Other Seated Ankle Exercises R 4 way ankle with red TB x15                PT Education - 10/17/16 1152    Education provided Yes   Education Details HEP update - strengthening program & quad foam rolling   Person(s) Educated Patient   Methods Explanation;Demonstration;Handout   Comprehension Verbalized understanding;Returned demonstration;Need further instruction  PT Long Term Goals - 10/17/16 1126      PT LONG TERM GOAL #1   Title Independent with HEP by 11/16/16   Status On-going     PT LONG TERM GOAL #2   Title R ankle DF AROM >/= 10 dg and eversion AROM >/= 20 dg w/o increased pain by 11/16/16   Status On-going     PT LONG TERM GOAL #3   Title R knee AROM equivalent to L knee w/o increased pain by 11/16/16   Status On-going     PT LONG TERM GOAL #4   Title R LE strength >/= 4/5 to 4+/5 by 11/16/16   Status On-going     PT LONG TERM GOAL #5   Title Pt will report ability to complete household chores, job tasks and normal recreational activities w/o limitation due to R knee or ankle pain by 11/16/16   Status On-going               Plan - 10/17/16 1126    Clinical Impression Statement Pt reporting decreased pain since working on stretches, with pain no longer reaching 10/10 intensity and only rarely needing to take pain  meds. Some R knee pain noted with quad stretch, therefore introduced foam rolling as alterantive to promote increased muscle flexibility in quads. Introduced [employer] features editor with emphasis on core activation, quad strengthening with VMO focus, and ankle strengthening with pt reporting pain further reduced to 1/10 by end of session. HEP updatde to include strengthening exercises.   Clinical Impairments Affecting Rehab Potential h/o R knee bursitis ~4 yrs ago, obesity   PT Treatment/Interventions Patient/family education;Therapeutic exercise;Neuromuscular re-education;Balance training;Gait training;Stair training;Manual techniques;Taping;Dry needling;Electrical Stimulation;Cryotherapy;Vasopneumatic Device;Moist Heat;Iontophoresis 4mg /ml Dexamethasone;ADLs/Self Care Home Management   PT Next Visit Plan LE/core strengthening; manual therapy including taping as indicated; modalities PRN   Consulted and Agree with Plan of Care Patient      Patient will benefit from skilled therapeutic intervention in order to improve the following deficits and impairments:  Pain, Impaired flexibility, Increased muscle spasms, Decreased range of motion, Decreased strength, Difficulty walking, Abnormal gait, Decreased activity tolerance  Visit Diagnosis: Pain in right ankle and joints of right foot  Acute pain of right knee  Stiffness of right ankle, not elsewhere classified  Stiffness of right knee, not elsewhere classified  Difficulty in walking, not elsewhere classified     Problem List Patient Active Problem List   Diagnosis Date Noted  . Anxiety 09/25/2016  . Palpitations 02/07/2016  . Severe obesity (BMI >= 40) ([place]) 02/07/2016  . Tinnitus of right ear 01/25/2016  . Migraine 10/26/2014  . Atypical squamous cell changes of undetermined significance (ASCUS) on cervical cytology with positive high risk human papilloma virus (HPV) 11/03/2013    Percival Spanish, PT, MPT 10/17/2016, 12:15 PM  [facility] [address]  [place], [place], [address] Phone: [phone]   Fax:  [phone]  Name: LA SHEHAN MRN: [id number] Date of Birth: [birth year]

## 2016-10-22 ENCOUNTER — Other Ambulatory Visit: Payer: Self-pay

## 2016-10-22 MED ORDER — NORETHIN ACE-ETH ESTRAD-FE 1-20 MG-MCG(24) PO TABS: 1.0000 | ORAL_TABLET | Freq: Every day | ORAL | 11 refills | Status: DC

## 2016-10-22 MED FILL — LARIN 24 FE 1 MG-20 MCG TAB: 1-20 | 84 days supply | Qty: 84 | Fill #0

## 2016-10-24 ENCOUNTER — Ambulatory Visit: Payer: PRIVATE HEALTH INSURANCE

## 2016-10-24 ENCOUNTER — Ambulatory Visit (INDEPENDENT_AMBULATORY_CARE_PROVIDER_SITE_OTHER): Payer: 59 | Admitting: Family Medicine

## 2016-10-24 VITALS — BP 119/73 | HR 83 | Temp 98.1°F | Ht 66.0 in | Wt 236.0 lb

## 2016-10-24 DIAGNOSIS — E559 Vitamin D deficiency, unspecified: Secondary | ICD-10-CM | POA: Diagnosis not present

## 2016-10-24 DIAGNOSIS — R7303 Prediabetes: Secondary | ICD-10-CM

## 2016-10-24 DIAGNOSIS — Z6838 Body mass index (BMI) 38.0-38.9, adult: Secondary | ICD-10-CM | POA: Diagnosis not present

## 2016-10-24 DIAGNOSIS — E669 Obesity, unspecified: Secondary | ICD-10-CM

## 2016-10-24 NOTE — Progress Notes (Signed)
Office: [phone]  /  Fax: [phone]   HPI:   Chief Complaint: OBESITY Katherine Mcfarland is here to discuss her progress with her obesity treatment plan. She is on the  follow the Category 3 plan and is following her eating plan approximately 75 % of the time. She states she is exercising 30 to 45 minutes 3 times per week. Katherine Mcfarland continues to do well with weight loss. She has been planning ahead well and hunger is controlled. Her weight is 236 lb (107 kg) today and has had a weight loss of 2 pounds over a period of 2 weeks since her last visit. She has lost 5 lbs since starting treatment with [place].  Pre-Diabetes Katherine Mcfarland has a diagnosis of pre-diabetes based on her elevated Hgb A1c and was informed this puts her at greater risk of developing diabetes. She is taking metformin currently and continues to work on diet and exercise to decrease risk of diabetes. She denies nausea or hypoglycemia.  Vitamin D deficiency Katherine Mcfarland has a diagnosis of vitamin D deficiency. She is currently taking vit D and denies nausea, vomiting or muscle weakness.   ALLERGIES: Allergies  Allergen Reactions   Metronidazole Nausea And Vomiting   Mushroom Extract Complex Diarrhea    MEDICATIONS: Current Outpatient Prescriptions on File Prior to Visit  Medication Sig Dispense Refill   aspirin-acetaminophen-caffeine (EXCEDRIN MIGRAINE) 250-250-65 MG per tablet Take 2 tablets by mouth every 6 (six) hours as needed for headache or migraine.     fluticasone (FLONASE) 50 MCG/ACT nasal spray Place 2 sprays into both nostrils daily. 16 g 6   levocetirizine (XYZAL) 5 MG tablet Take 5 mg by mouth every evening. Take 10mg  prn     metFORMIN (GLUCOPHAGE) 500 MG tablet Take 1 tablet (500 mg total) by mouth 2 (two) times daily with a meal. 180 tablet 3   metoprolol succinate (TOPROL-XL) 25 MG 24 hr tablet Take 1 tablet (25 mg total) by mouth every evening. 90 tablet 3   montelukast (SINGULAIR) 10 MG tablet Take 1 tablet  by mouth daily.  5   Norethindrone Acetate-Ethinyl Estrad-FE (LOESTRIN 24 FE) 1-20 MG-MCG(24) tablet Take 1 tablet by mouth daily. 3 Package 11   SUMAtriptan (IMITREX) 100 MG tablet Take one tablet at onset of headache. May repeat in 2 hours. No more than 2 pills in 24 hours.   Follow-up with me on Monday to see how this is working. 10 tablet 0   Vitamin D, Ergocalciferol, (DRISDOL) 50000 units CAPS capsule Take 1 capsule (50,000 Units total) by mouth every 7 (seven) days. 4 capsule 0   No current facility-administered medications on file prior to visit.     PAST MEDICAL HISTORY: Past Medical History:  Diagnosis Date   Allergy    pt. reports seasonal allergies   Headache    Lactose intolerance    Palpitations    Right knee pain    Tinnitus     PAST SURGICAL HISTORY: Past Surgical History:  Procedure Laterality Date   CESAREAN SECTION     WISDOM TOOTH EXTRACTION      SOCIAL HISTORY: Social History  Substance Use Topics   Smoking status: Never Smoker   Smokeless tobacco: Never Used   Alcohol use Yes     Comment: social    FAMILY HISTORY: Family History  Problem Relation Age of Onset   Diabetes Maternal Grandmother    Hypertension Maternal Grandmother    Hypertension Mother    Hyperlipidemia Mother    Hypertension Father  ROS: Review of Systems  Constitutional: Positive for weight loss.  Gastrointestinal: Negative for nausea and vomiting.  Musculoskeletal:       Negative muscle weakness  Endo/Heme/Allergies:       Negative polyphagia Negative hypoglycemia    PHYSICAL EXAM: Blood pressure 119/73, pulse 83, temperature 98.1 F (36.7 C), temperature source Oral, height 5\' 6"  (1.676 m), weight 236 lb (107 kg), SpO2 100 %. Body mass index is 38.09 kg/m. Physical Exam  Constitutional: She is oriented to person, place, and time. She appears well-developed and well-nourished.  Cardiovascular: Normal rate.   Pulmonary/Chest: Effort normal.    Musculoskeletal: Normal range of motion.  Neurological: She is oriented to person, place, and time.  Skin: Skin is warm and dry.  Psychiatric: She has a normal mood and affect. Her behavior is normal.  Vitals reviewed.   RECENT LABS AND TESTS: BMET    Component Value Date/Time   NA 140 09/26/2016 0946   K 4.1 09/26/2016 0946   CL 101 09/26/2016 0946   CO2 23 09/26/2016 0946   GLUCOSE 114 (H) 09/26/2016 0946   GLUCOSE 109 (H) 01/04/2016 0802   BUN 12 09/26/2016 0946   CREATININE 0.85 09/26/2016 0946   CREATININE 0.86 10/26/2014 1502   CALCIUM 9.4 09/26/2016 0946   GFRNONAA 90 09/26/2016 0946   GFRNONAA >89 10/26/2014 1502   GFRAA 104 09/26/2016 0946   GFRAA >89 10/26/2014 1502   Lab Results  Component Value Date   HGBA1C 6.1 (H) 09/26/2016   Lab Results  Component Value Date   INSULIN 23.3 09/26/2016   CBC    Component Value Date/Time   WBC 5.5 09/26/2016 0946   WBC 5.9 01/04/2016 0802   RBC 4.77 09/26/2016 0946   RBC 4.63 01/04/2016 0802   HGB 12.5 09/26/2016 0946   HCT 39.5 09/26/2016 0946   PLT 319.0 01/04/2016 0802   MCV 83 09/26/2016 0946   MCH 26.2 (L) 09/26/2016 0946   MCH 27.0 10/26/2014 1502   MCHC 31.6 09/26/2016 0946   MCHC 33.2 01/04/2016 0802   RDW 13.7 09/26/2016 0946   LYMPHSABS 2.1 09/26/2016 0946   MONOABS 0.4 01/04/2016 0802   EOSABS 0.3 09/26/2016 0946   BASOSABS 0.0 09/26/2016 0946   Iron/TIBC/Ferritin/ %Sat No results found for: IRON, TIBC, FERRITIN, IRONPCTSAT Lipid Panel     Component Value Date/Time   CHOL 171 09/26/2016 0946   TRIG 96 09/26/2016 0946   HDL 45 09/26/2016 0946   CHOLHDL 4 01/04/2016 0802   VLDL 18.2 01/04/2016 0802   LDLCALC 107 (H) 09/26/2016 0946   Hepatic Function Panel     Component Value Date/Time   PROT 7.2 09/26/2016 0946   ALBUMIN 4.0 09/26/2016 0946   AST 20 09/26/2016 0946   ALT 19 09/26/2016 0946   ALKPHOS 73 09/26/2016 0946   BILITOT 0.3 09/26/2016 0946      Component Value Date/Time    TSH 1.150 09/26/2016 0946   TSH 1.10 02/09/2016 1127   TSH 1.73 01/04/2016 0802    ASSESSMENT AND PLAN: Prediabetes  Vitamin D deficiency - Plan: Vitamin D, Ergocalciferol, (DRISDOL) 50000 units CAPS capsule  Class 2 obesity without serious comorbidity with body mass index (BMI) of 38.0 to 38.9 in adult, unspecified obesity type  PLAN:  Pre-Diabetes Katherine Mcfarland will continue to work on weight loss, exercise, and decreasing simple carbohydrates in her diet to help decrease the risk of diabetes. We dicussed metformin including benefits and risks. She was informed that eating too many simple carbohydrates  or too many calories at one sitting increases the likelihood of GI side effects. Katherine Mcfarland agrees to continue metformin and follow up with [place] as directed to monitor her progress.  Vitamin D Deficiency Katherine Mcfarland was informed that low vitamin D levels contributes to fatigue and are associated with obesity, breast, and colon cancer. She agrees to continue to take prescription Vit D @50 ,000 IU every week, we will refill for 1 month and will follow up for routine testing of vitamin D, at least 2-3 times per year. She was informed of the risk of over-replacement of vitamin D and agrees to not increase her dose unless he discusses this with [place] first. Katherine Mcfarland agrees to follow up with our clinic in 2 weeks.  Obesity Katherine Mcfarland is currently in the action stage of change. As such, her goal is to continue with weight loss efforts She has agreed to follow the Category 3 plan Katherine Mcfarland has been instructed to work up to a goal of 150 minutes of combined cardio and strengthening exercise per week for weight loss and overall health benefits. We discussed the following Behavioral Modification Strategies today: increasing lean protein intake and meal planning & cooking strategies  Katherine Mcfarland has agreed to follow up with our clinic in 2 weeks. She was informed of the importance of frequent follow up visits to maximize  her success with intensive lifestyle modifications for her multiple health conditions.  I, Doreene Nest, am acting as transcriptionist for Dennard Nip, MD  I have reviewed the above documentation for accuracy and completeness, and I agree with the above. -Dennard Nip, MD   OBESITY BEHAVIORAL INTERVENTION VISIT  Today's visit was # 3 out of 22.  Starting weight: 241 lbs Starting date: 09/26/16 lbs Today's weight : 236 lbs Today's date: 10/24/2016 Total lbs lost to date: 5 (Patients must lose 7 lbs in the first 6 months to continue with counseling)   ASK: We discussed the diagnosis of obesity with Katherine Mcfarland today and Katherine Mcfarland agreed to give [place] permission to discuss obesity behavioral modification therapy today.  ASSESS: Katherine Mcfarland has the diagnosis of obesity and her BMI today is 38.2 Katherine Mcfarland is in the action stage of change   ADVISE: Katherine Mcfarland was educated on the multiple health risks of obesity as well as the benefit of weight loss to improve her health. She was advised of the need for long term treatment and the importance of lifestyle modifications.  AGREE: Multiple dietary modification options and treatment options were discussed and  Katherine Mcfarland agreed to follow the Category 3 plan We discussed the following Behavioral Modification Strategies today: increasing lean protein intake and meal planning & cooking strategies

## 2016-10-25 ENCOUNTER — Other Ambulatory Visit (INDEPENDENT_AMBULATORY_CARE_PROVIDER_SITE_OTHER): Payer: Self-pay | Admitting: Family Medicine

## 2016-10-25 DIAGNOSIS — E559 Vitamin D deficiency, unspecified: Secondary | ICD-10-CM

## 2016-10-26 ENCOUNTER — Ambulatory Visit: Payer: PRIVATE HEALTH INSURANCE

## 2016-10-29 ENCOUNTER — Other Ambulatory Visit (INDEPENDENT_AMBULATORY_CARE_PROVIDER_SITE_OTHER): Payer: Self-pay | Admitting: Family Medicine

## 2016-10-29 DIAGNOSIS — E559 Vitamin D deficiency, unspecified: Secondary | ICD-10-CM

## 2016-10-29 MED ORDER — VITAMIN D (ERGOCALCIFEROL) 1.25 MG (50000 UNIT) PO CAPS: 50000.0000 [IU] | ORAL_CAPSULE | ORAL | 0 refills | Status: DC

## 2016-10-30 MED ORDER — VITAMIN D (ERGOCALCIFEROL) 1.25 MG (50000 UNIT) PO CAPS: 50000.0000 [IU] | ORAL_CAPSULE | ORAL | 0 refills | Status: DC

## 2016-10-31 ENCOUNTER — Ambulatory Visit: Payer: PRIVATE HEALTH INSURANCE | Admitting: Physical Therapy

## 2016-11-01 ENCOUNTER — Other Ambulatory Visit (INDEPENDENT_AMBULATORY_CARE_PROVIDER_SITE_OTHER): Payer: 59

## 2016-11-01 ENCOUNTER — Other Ambulatory Visit: Payer: Self-pay | Admitting: Family Medicine

## 2016-11-01 DIAGNOSIS — N912 Amenorrhea, unspecified: Secondary | ICD-10-CM

## 2016-11-01 LAB — SPECIMEN STATUS

## 2016-11-02 ENCOUNTER — Other Ambulatory Visit (INDEPENDENT_AMBULATORY_CARE_PROVIDER_SITE_OTHER): Payer: 59

## 2016-11-02 ENCOUNTER — Other Ambulatory Visit: Payer: Self-pay | Admitting: Family Medicine

## 2016-11-02 ENCOUNTER — Ambulatory Visit: Payer: PRIVATE HEALTH INSURANCE | Admitting: Physical Therapy

## 2016-11-02 DIAGNOSIS — N912 Amenorrhea, unspecified: Secondary | ICD-10-CM

## 2016-11-02 DIAGNOSIS — M25661 Stiffness of right knee, not elsewhere classified: Secondary | ICD-10-CM

## 2016-11-02 DIAGNOSIS — R262 Difficulty in walking, not elsewhere classified: Secondary | ICD-10-CM

## 2016-11-02 DIAGNOSIS — M25561 Pain in right knee: Secondary | ICD-10-CM

## 2016-11-02 DIAGNOSIS — M25571 Pain in right ankle and joints of right foot: Secondary | ICD-10-CM

## 2016-11-02 DIAGNOSIS — M25671 Stiffness of right ankle, not elsewhere classified: Secondary | ICD-10-CM

## 2016-11-02 LAB — HCG, QUANTITATIVE, PREGNANCY: QUANTITATIVE HCG: 0.21 m[IU]/mL

## 2016-11-02 LAB — SPECIMEN STATUS REPORT

## 2016-11-02 LAB — BETA HCG QUANT (REF LAB)

## 2016-11-02 LAB — HCG, SERUM, QUALITATIVE: hCG,Beta Subunit,Qual,Serum: NEGATIVE m[IU]/mL (ref <6)

## 2016-11-02 NOTE — Therapy (Signed)
[facility] [address]  [place], [place], [address] Phone: [phone]   Fax:  [phone]  Physical Therapy Treatment  Patient Details  Name: Katherine Mcfarland MRN: [id number] Date of Birth: March 04, 1986 Referring Provider: Pedro Earls, MD  Encounter Date: 11/02/2016      PT End of Session - 11/02/16 1105    Visit Number 3   Number of Visits 9   Date for PT Re-Evaluation 11/16/16   Authorization Type WC   Authorization - Number of Visits 9   PT Start Time 1105   PT Stop Time 1150   PT Time Calculation (min) 45 min   Activity Tolerance Patient tolerated treatment well   Behavior During Therapy [facility] for tasks assessed/performed      Past Medical History:  Diagnosis Date  . Allergy    pt. reports seasonal allergies  . Headache   . Lactose intolerance   . Palpitations   . Right knee pain   . Tinnitus     Past Surgical History:  Procedure Laterality Date  . CESAREAN SECTION    . WISDOM TOOTH EXTRACTION      There were no vitals filed for this visit.      Subjective Assessment - 11/02/16 1107    Subjective Pt pain free today. Deines any issues with HEP.   Pertinent History R knee bursitis ~4 yrs ago   Patient Stated Goals "to relieve pressure and pain so I can walk better"   Currently in Pain? No/denies   Pain Score 0-No pain                         [facility] Adult PT Treatment/Exercise - 11/02/16 1105      Knee/Hip Exercises: Stretches   Other Knee/Hip Stretches foam rolling to quads 3 way (toes down, pointed in & out) x 1 minute each     Knee/Hip Exercises: Aerobic   Recumbent Bike L2 x 6'     Knee/Hip Exercises: Standing   Hip Flexion Both;10 reps;Knee straight;Stengthening   Hip Flexion Limitations red TB, 1 pole A   Terminal Knee Extension Right;15 reps;Theraband   Theraband Level (Terminal Knee Extension) Level 4 (Blue)   Hip ADduction Both;10 reps;Strengthening    Hip ADduction Limitations red TB, 1 pole A   Hip Abduction Both;10 reps;Knee straight;Stengthening   Abduction Limitations red TB, 1 pole A   Hip Extension Both;10 reps;Knee straight;Stengthening   Extension Limitations red TB, 1 pole A   Functional Squat 10 reps;3 seconds   Functional Squat Limitations TRX, pt noting pull at patella over last few reps   Wall Squat Limitations deferred d/t same pain as with squats   SLS R SLS - B rotational stability with red TB pallof press x10 each   SLS with Vectors R SLS on blue foam oval with L toe tap to balance bubbles x5 revolutions                PT Education - 11/02/16 1154    Education provided Yes   Education Details HEP update (see Pt Instructions)   Person(s) Educated Patient   Methods Explanation;Demonstration;Handout   Comprehension Verbalized understanding;Returned demonstration;Need further instruction             PT Long Term Goals - 10/17/16 1126      PT LONG TERM GOAL #1   Title Independent with HEP by 11/16/16   Status On-going  PT LONG TERM GOAL #2   Title R ankle DF AROM >/= 10 dg and eversion AROM >/= 20 dg w/o increased pain by 11/16/16   Status On-going     PT LONG TERM GOAL #3   Title R knee AROM equivalent to L knee w/o increased pain by 11/16/16   Status On-going     PT LONG TERM GOAL #4   Title R LE strength >/= 4/5 to 4+/5 by 11/16/16   Status On-going     PT LONG TERM GOAL #5   Title Pt will report ability to complete household chores, job tasks and normal recreational activities w/o limitation due to R knee or ankle pain by 11/16/16   Status On-going               Plan - 11/02/16 1108    Clinical Impression Statement Pt returning after nearly 2 week absence from PT. Pt reporting no concerns with HEP but has not attempted quad foam rolling. Pt painfree on arrival today but did note slight knee pain with fatigue during squats and ankle pain with prolonged SLS activites, all of which resolved  upon termination of respective exercises. HEP updated to include some of today's exercise progression.   Clinical Impairments Affecting Rehab Potential h/o R knee bursitis ~4 yrs ago, obesity   PT Treatment/Interventions Patient/family education;Therapeutic exercise;Neuromuscular re-education;Balance training;Gait training;Stair training;Manual techniques;Taping;Dry needling;Electrical Stimulation;Cryotherapy;Vasopneumatic Device;Moist Heat;Iontophoresis 4mg /ml Dexamethasone;ADLs/Self Care Home Management   PT Next Visit Plan LE/core strengthening; manual therapy including taping as indicated; modalities PRN   Consulted and Agree with Plan of Care Patient      Patient will benefit from skilled therapeutic intervention in order to improve the following deficits and impairments:  Pain, Impaired flexibility, Increased muscle spasms, Decreased range of motion, Decreased strength, Difficulty walking, Abnormal gait, Decreased activity tolerance  Visit Diagnosis: Pain in right ankle and joints of right foot  Acute pain of right knee  Stiffness of right ankle, not elsewhere classified  Stiffness of right knee, not elsewhere classified  Difficulty in walking, not elsewhere classified     Problem List Patient Active Problem List   Diagnosis Date Noted  . Anxiety 09/25/2016  . Palpitations 02/07/2016  . Severe obesity (BMI >= 40) ([place]) 02/07/2016  . Tinnitus of right ear 01/25/2016  . Migraine 10/26/2014  . Atypical squamous cell changes of undetermined significance (ASCUS) on cervical cytology with positive high risk human papilloma virus (HPV) 11/03/2013    Percival Spanish, PT, MPT 11/02/2016, 12:00 PM  [facility] [address]  [place], [place], [address] Phone: [phone]   Fax:  [phone]  Name: Katherine Mcfarland MRN: [id number] Date of Birth: 1985-11-25

## 2016-11-05 ENCOUNTER — Other Ambulatory Visit: Payer: Self-pay | Admitting: *Deleted

## 2016-11-05 ENCOUNTER — Other Ambulatory Visit: Payer: Self-pay | Admitting: Family Medicine

## 2016-11-05 ENCOUNTER — Other Ambulatory Visit (INDEPENDENT_AMBULATORY_CARE_PROVIDER_SITE_OTHER): Payer: 59

## 2016-11-05 DIAGNOSIS — G44221 Chronic tension-type headache, intractable: Secondary | ICD-10-CM

## 2016-11-05 DIAGNOSIS — N912 Amenorrhea, unspecified: Secondary | ICD-10-CM

## 2016-11-05 MED ORDER — CYCLOBENZAPRINE HCL 10 MG PO TABS: 10.0000 mg | ORAL_TABLET | Freq: Every evening | ORAL | 0 refills | Status: DC | PRN

## 2016-11-05 MED FILL — VIT D2 1.25 MG (50,000 UNIT: 1.25 MG | 28 days supply | Qty: 4 | Fill #0

## 2016-11-05 MED FILL — CYCLOBENZAPRINE 10 MG TABLE: 10 | 30 days supply | Qty: 30 | Fill #0

## 2016-11-06 LAB — HCG, QUANTITATIVE, PREGNANCY: Quantitative HCG: 0.12 m[IU]/mL

## 2016-11-07 ENCOUNTER — Ambulatory Visit (INDEPENDENT_AMBULATORY_CARE_PROVIDER_SITE_OTHER): Payer: 59 | Admitting: Family Medicine

## 2016-11-07 ENCOUNTER — Ambulatory Visit (INDEPENDENT_AMBULATORY_CARE_PROVIDER_SITE_OTHER): Payer: 59 | Admitting: Psychology

## 2016-11-07 VITALS — BP 122/83 | HR 87 | Temp 97.7°F | Ht 66.0 in | Wt 234.0 lb

## 2016-11-07 DIAGNOSIS — F4323 Adjustment disorder with mixed anxiety and depressed mood: Secondary | ICD-10-CM | POA: Diagnosis not present

## 2016-11-07 DIAGNOSIS — Z6837 Body mass index (BMI) 37.0-37.9, adult: Secondary | ICD-10-CM

## 2016-11-07 DIAGNOSIS — IMO0001 Reserved for inherently not codable concepts without codable children: Secondary | ICD-10-CM | POA: Insufficient documentation

## 2016-11-07 DIAGNOSIS — E559 Vitamin D deficiency, unspecified: Secondary | ICD-10-CM

## 2016-11-07 DIAGNOSIS — Z9189 Other specified personal risk factors, not elsewhere classified: Secondary | ICD-10-CM

## 2016-11-07 DIAGNOSIS — R7303 Prediabetes: Secondary | ICD-10-CM | POA: Diagnosis not present

## 2016-11-07 DIAGNOSIS — E669 Obesity, unspecified: Secondary | ICD-10-CM | POA: Diagnosis not present

## 2016-11-07 MED ORDER — VITAMIN D (ERGOCALCIFEROL) 1.25 MG (50000 UNIT) PO CAPS: 50000.0000 [IU] | ORAL_CAPSULE | ORAL | 0 refills | Status: DC

## 2016-11-07 MED ORDER — METFORMIN HCL 500 MG PO TABS: 500.0000 mg | ORAL_TABLET | Freq: Every day | ORAL | 0 refills | Status: DC

## 2016-11-07 NOTE — Progress Notes (Signed)
Office: [phone]  /  Fax: [phone]   HPI:   Chief Complaint: OBESITY Katherine Mcfarland is here to discuss her progress with her obesity treatment plan. She is on the  follow the Category 3 plan and is following her eating plan approximately 75 % of the time. She states she is exercising 10 minutes 5 times per week. Katherine Mcfarland continues to do well with weight loss. She is planning ahead well. Her weight is 234 lb (106.1 kg) today and has had a weight loss of 2 pounds over a period of 2 weeks since her last visit. She has lost 7 lbs since starting treatment with [place].  Vitamin D deficiency Katherine Mcfarland has a diagnosis of vitamin D deficiency. She is currently taking vit D and denies nausea, vomiting or muscle weakness.  Pre-Diabetes Katherine Mcfarland has a diagnosis of pre-diabetes based on her elevated Hgb A1c and was informed this puts her at greater risk of developing diabetes. She is taking metformin currently and continues to work on diet and exercise to decrease risk of diabetes. She denies nausea, polyphagia or hypoglycemia.   ALLERGIES: Allergies  Allergen Reactions  . Metronidazole Nausea And Vomiting  . Mushroom Extract Complex Diarrhea    MEDICATIONS: Current Outpatient Prescriptions on File Prior to Visit  Medication Sig Dispense Refill  . cyclobenzaprine (FLEXERIL) 10 MG tablet Take 1 tablet (10 mg total) by mouth at bedtime as needed for muscle spasms. 30 tablet 0  . fluticasone (FLONASE) 50 MCG/ACT nasal spray Place 2 sprays into both nostrils daily. 16 g 6  . levocetirizine (XYZAL) 5 MG tablet Take 5 mg by mouth every evening. Take 10mg  prn    . montelukast (SINGULAIR) 10 MG tablet Take 1 tablet by mouth daily.  5  . Norethindrone Acetate-Ethinyl Estrad-FE (LOESTRIN 24 FE) 1-20 MG-MCG(24) tablet Take 1 tablet by mouth daily. 3 Package 11  . SUMAtriptan (IMITREX) 100 MG tablet Take one tablet at onset of headache. May repeat in 2 hours. No more than 2 pills in 24 hours.   Follow-up  with me on Monday to see how this is working. 10 tablet 0   No current facility-administered medications on file prior to visit.     PAST MEDICAL HISTORY: Past Medical History:  Diagnosis Date  . Allergy    pt. reports seasonal allergies  . Headache   . Lactose intolerance   . Palpitations   . Right knee pain   . Tinnitus     PAST SURGICAL HISTORY: Past Surgical History:  Procedure Laterality Date  . CESAREAN SECTION    . WISDOM TOOTH EXTRACTION      SOCIAL HISTORY: Social History  Substance Use Topics  . Smoking status: Never Smoker  . Smokeless tobacco: Never Used  . Alcohol use Yes     Comment: social    FAMILY HISTORY: Family History  Problem Relation Age of Onset  . Diabetes Maternal Grandmother   . Hypertension Maternal Grandmother   . Hypertension Mother   . Hyperlipidemia Mother   . Hypertension Father     ROS: Review of Systems  Constitutional: Positive for weight loss.  Gastrointestinal: Negative for nausea and vomiting.  Musculoskeletal:       Negative muscle weakness  Endo/Heme/Allergies:       Negative polyphagia Negative hypoglycemia    PHYSICAL EXAM: Blood pressure 122/83, pulse 87, temperature 97.7 F (36.5 C), temperature source Oral, height 5\' 6"  (1.676 m), weight 234 lb (106.1 kg), SpO2 100 %. Body mass index is 37.77 kg/m.  Physical Exam  Constitutional: She is oriented to person, place, and time. She appears well-developed and well-nourished.  Cardiovascular: Normal rate.   Pulmonary/Chest: Effort normal.  Musculoskeletal: Normal range of motion.  Neurological: She is oriented to person, place, and time.  Skin: Skin is warm and dry.  Psychiatric: She has a normal mood and affect. Her behavior is normal.  Vitals reviewed.   RECENT LABS AND TESTS: BMET    Component Value Date/Time   NA 140 09/26/2016 0946   K 4.1 09/26/2016 0946   CL 101 09/26/2016 0946   CO2 23 09/26/2016 0946   GLUCOSE 114 (H) 09/26/2016 0946   GLUCOSE  109 (H) 01/04/2016 0802   BUN 12 09/26/2016 0946   CREATININE 0.85 09/26/2016 0946   CREATININE 0.86 10/26/2014 1502   CALCIUM 9.4 09/26/2016 0946   GFRNONAA 90 09/26/2016 0946   GFRNONAA >89 10/26/2014 1502   GFRAA 104 09/26/2016 0946   GFRAA >89 10/26/2014 1502   Lab Results  Component Value Date   HGBA1C 6.1 (H) 09/26/2016   Lab Results  Component Value Date   INSULIN 23.3 09/26/2016   CBC    Component Value Date/Time   WBC WILL FOLLOW 11/01/2016 1216   WBC 5.9 01/04/2016 0802   RBC WILL FOLLOW 11/01/2016 1216   RBC 4.63 01/04/2016 0802   HGB WILL FOLLOW 11/01/2016 1216   HCT WILL FOLLOW 11/01/2016 1216   PLT WILL FOLLOW 11/01/2016 1216   MCV WILL FOLLOW 11/01/2016 1216   MCH WILL FOLLOW 11/01/2016 1216   MCH 27.0 10/26/2014 1502   MCHC WILL FOLLOW 11/01/2016 1216   MCHC 33.2 01/04/2016 0802   RDW WILL FOLLOW 11/01/2016 1216   LYMPHSABS WILL FOLLOW 11/01/2016 1216   MONOABS 0.4 01/04/2016 0802   EOSABS WILL FOLLOW 11/01/2016 1216   BASOSABS WILL FOLLOW 11/01/2016 1216   Iron/TIBC/Ferritin/ %Sat No results found for: IRON, TIBC, FERRITIN, IRONPCTSAT Lipid Panel     Component Value Date/Time   CHOL 171 09/26/2016 0946   TRIG 96 09/26/2016 0946   HDL 45 09/26/2016 0946   CHOLHDL 4 01/04/2016 0802   VLDL 18.2 01/04/2016 0802   LDLCALC 107 (H) 09/26/2016 0946   Hepatic Function Panel     Component Value Date/Time   PROT 7.2 09/26/2016 0946   ALBUMIN 4.0 09/26/2016 0946   AST 20 09/26/2016 0946   ALT 19 09/26/2016 0946   ALKPHOS 73 09/26/2016 0946   BILITOT 0.3 09/26/2016 0946      Component Value Date/Time   TSH 1.150 09/26/2016 0946   TSH 1.10 02/09/2016 1127   TSH 1.73 01/04/2016 0802    ASSESSMENT AND PLAN: Vitamin D deficiency - Plan: Vitamin D, Ergocalciferol, (DRISDOL) 50000 units CAPS capsule  Prediabetes - Plan: metFORMIN (GLUCOPHAGE) 500 MG tablet  At risk for diabetes mellitus  Class 2 obesity with serious comorbidity and body mass  index (BMI) of 37.0 to 37.9 in adult, unspecified obesity type  PLAN:  Vitamin D Deficiency Katherine Mcfarland was informed that low vitamin D levels contributes to fatigue and are associated with obesity, breast, and colon cancer. She agrees to continue to take prescription Vit D @50 ,000 IU every week, we will refill for 1 month and will follow up for routine testing of vitamin D, at least 2-3 times per year. She was informed of the risk of over-replacement of vitamin D and agrees to not increase her dose unless he discusses this with [place] first. Katherine Mcfarland agrees to follow up with our clinic in 2 to  3 weeks.  Diabetes II Katherine Mcfarland has been given extensive diabetes education by myself today including ideal fasting and post-prandial blood glucose readings, individual ideal Hgb A1c goals  and hypoglycemia prevention. We discussed the importance of good blood sugar control to decrease the likelihood of diabetic complications such as nephropathy, neuropathy, limb loss, blindness, coronary artery disease, and death. We discussed the importance of intensive lifestyle modification including diet, exercise and weight loss as the first line treatment for diabetes. Katherine Mcfarland agrees to continue metformin, we will refill for 1 month and will follow up at the agreed upon time.  Obesity Katherine Mcfarland is currently in the action stage of change. As such, her goal is to continue with weight loss efforts She has agreed to follow the Category 3 plan Katherine Mcfarland has been instructed to work up to a goal of 150 minutes of combined cardio and strengthening exercise per week for weight loss and overall health benefits. We discussed the following Behavioral Modification Strategies today: increasing lean protein intake and meal planning & cooking strategies  Katherine Mcfarland has agreed to follow up with our clinic in 2 to 3 weeks. She was informed of the importance of frequent follow up visits to maximize her success with intensive lifestyle modifications  for her multiple health conditions.  I, Doreene Nest, am acting as transcriptionist for Dennard Nip, MD  I have reviewed the above documentation for accuracy and completeness, and I agree with the above. -Dennard Nip, MD  OBESITY BEHAVIORAL INTERVENTION VISIT  Today's visit was # 4 out of 22.  Starting weight: 241 lbs Starting date: 09/26/16 Today's weight : 234 lbs  Today's date: 11/07/2016 Total lbs lost to date: 7 (Patients must lose 7 lbs in the first 6 months to continue with counseling)   ASK: We discussed the diagnosis of obesity with Katherine Mcfarland today and Katherine Mcfarland agreed to give [place] permission to discuss obesity behavioral modification therapy today.  ASSESS: Katherine Mcfarland has the diagnosis of obesity and her BMI today is 62.8 Katherine Mcfarland is in the action stage of change   ADVISE: Katherine Mcfarland was educated on the multiple health risks of obesity as well as the benefit of weight loss to improve her health. She was advised of the need for long term treatment and the importance of lifestyle modifications.  AGREE: Multiple dietary modification options and treatment options were discussed and  Katherine Mcfarland agreed to follow the Category 3 plan We discussed the following Behavioral Modification Strategies today: increasing lean protein intake and meal planning & cooking strategies

## 2016-11-09 ENCOUNTER — Ambulatory Visit: Payer: PRIVATE HEALTH INSURANCE | Admitting: Physical Therapy

## 2016-11-09 ENCOUNTER — Other Ambulatory Visit: Payer: Self-pay

## 2016-11-09 DIAGNOSIS — M25561 Pain in right knee: Secondary | ICD-10-CM

## 2016-11-09 DIAGNOSIS — M25671 Stiffness of right ankle, not elsewhere classified: Secondary | ICD-10-CM

## 2016-11-09 DIAGNOSIS — M25661 Stiffness of right knee, not elsewhere classified: Secondary | ICD-10-CM

## 2016-11-09 DIAGNOSIS — R262 Difficulty in walking, not elsewhere classified: Secondary | ICD-10-CM

## 2016-11-09 DIAGNOSIS — M25571 Pain in right ankle and joints of right foot: Secondary | ICD-10-CM

## 2016-11-09 NOTE — Therapy (Signed)
[facility] [address]  [place], [place], [address] Phone: [phone]   Fax:  [phone]  Physical Therapy Treatment  Patient Details  Name: Katherine Mcfarland MRN: [id number] Date of Birth: 12/03/1985 Referring Provider: Pedro Earls, MD  Encounter Date: 11/09/2016      PT End of Session - 11/09/16 1100    Visit Number 4   Number of Visits 9   Date for PT Re-Evaluation 11/16/16   Authorization Type WC   Authorization - Number of Visits 9   PT Start Time 1100   PT Stop Time 1141   PT Time Calculation (min) 41 min   Activity Tolerance Patient tolerated treatment well   Behavior During Therapy [facility] for tasks assessed/performed      Past Medical History:  Diagnosis Date  . Allergy    pt. reports seasonal allergies  . Headache   . Lactose intolerance   . Palpitations   . Right knee pain   . Tinnitus     Past Surgical History:  Procedure Laterality Date  . CESAREAN SECTION    . WISDOM TOOTH EXTRACTION      There were no vitals filed for this visit.      Subjective Assessment - 11/09/16 1102    Subjective Pt feeling good today. No recent concerns.   Pertinent History R knee bursitis ~4 yrs ago   Patient Stated Goals "to relieve pressure and pain so I can walk better"   Currently in Pain? No/denies   Pain Score 0-No pain                         [facility] Adult PT Treatment/Exercise - 11/09/16 1100      Knee/Hip Exercises: Aerobic   Recumbent Bike L2 x 6'     Knee/Hip Exercises: Machines for Strengthening   Cybex Leg Press B LE 25# x15     Knee/Hip Exercises: Standing   Functional Squat 15 reps;3 seconds   Functional Squat Limitations minisquat on BOSU (inverted)   Other Standing Knee Exercises B side-stepping & fwd/back monster walk with looped reb TB x 40 ft each     Ankle Exercises: Machines for Strengthening   Cybex Leg Press B Calf press 10# x15     Ankle Exercises:  Standing   Vector Stance Right;15 seconds;5 reps   Vector Stance Limitations on blue foam oval with L toe tap to balance bubbles   Heel Raises 20 reps;3 seconds   Heel Raises Limitations B con/R ecc - at back of UBE                     PT Long Term Goals - 10/17/16 1126      PT LONG TERM GOAL #1   Title Independent with HEP by 11/16/16   Status On-going     PT LONG TERM GOAL #2   Title R ankle DF AROM >/= 10 dg and eversion AROM >/= 20 dg w/o increased pain by 11/16/16   Status On-going     PT LONG TERM GOAL #3   Title R knee AROM equivalent to L knee w/o increased pain by 11/16/16   Status On-going     PT LONG TERM GOAL #4   Title R LE strength >/= 4/5 to 4+/5 by 11/16/16   Status On-going     PT LONG TERM GOAL #5   Title Pt will report ability to  complete household chores, job tasks and normal recreational activities w/o limitation due to R knee or ankle pain by 11/16/16   Status On-going               Plan - 11/09/16 1102    Clinical Impression Statement Pt reporting she saw the MD on Wed and states he feels like she will probably need 3 more visits with PT, but ultimately left it up to PT descretion. Pt noting overall improvement, with knee doing well with minimal issues but noting more intermittent pain and weakness/instability at ankle. Good tolerance for exercises today, only noting very mild discomfort at ankle with fatigue during leg and calf presses.    Rehab Potential Good   Clinical Impairments Affecting Rehab Potential h/o R knee bursitis ~4 yrs ago, obesity   PT Treatment/Interventions Patient/family education;Therapeutic exercise;Neuromuscular re-education;Balance training;Gait training;Stair training;Manual techniques;Taping;Dry needling;Electrical Stimulation;Cryotherapy;Vasopneumatic Device;Moist Heat;Iontophoresis 4mg /ml Dexamethasone;ADLs/Self Care Home Management   PT Next Visit Plan LE/core strengthening; manual therapy including taping as  indicated; modalities PRN   Consulted and Agree with Plan of Care Patient      Patient will benefit from skilled therapeutic intervention in order to improve the following deficits and impairments:  Pain, Impaired flexibility, Increased muscle spasms, Decreased range of motion, Decreased strength, Difficulty walking, Abnormal gait, Decreased activity tolerance  Visit Diagnosis: Pain in right ankle and joints of right foot  Acute pain of right knee  Stiffness of right ankle, not elsewhere classified  Stiffness of right knee, not elsewhere classified  Difficulty in walking, not elsewhere classified     Problem List Patient Active Problem List   Diagnosis Date Noted  . Vitamin D deficiency 11/07/2016  . Prediabetes 11/07/2016  . Class 2 obesity with serious comorbidity and body mass index (BMI) of 37.0 to 37.9 in adult 11/07/2016  . Anxiety 09/25/2016  . Palpitations 02/07/2016  . Severe obesity (BMI >= 40) ([place]) 02/07/2016  . Tinnitus of right ear 01/25/2016  . Migraine 10/26/2014  . Atypical squamous cell changes of undetermined significance (ASCUS) on cervical cytology with positive high risk human papilloma virus (HPV) 11/03/2013    Percival Spanish, PT, MPT 11/09/2016, 11:46 AM  [facility] [address]  [place], [place], [address] Phone: [phone]   Fax:  [phone]  Name: Katherine Mcfarland MRN: [id number] Date of Birth: 01-16-1986

## 2016-11-15 ENCOUNTER — Ambulatory Visit: Payer: PRIVATE HEALTH INSURANCE

## 2016-11-21 ENCOUNTER — Ambulatory Visit: Payer: PRIVATE HEALTH INSURANCE | Attending: Sports Medicine | Admitting: Physical Therapy

## 2016-11-21 ENCOUNTER — Encounter: Payer: Self-pay | Admitting: Obstetrics & Gynecology

## 2016-11-21 ENCOUNTER — Ambulatory Visit (INDEPENDENT_AMBULATORY_CARE_PROVIDER_SITE_OTHER): Payer: 59 | Admitting: Physician Assistant

## 2016-11-21 ENCOUNTER — Ambulatory Visit (INDEPENDENT_AMBULATORY_CARE_PROVIDER_SITE_OTHER): Payer: 59 | Admitting: Obstetrics & Gynecology

## 2016-11-21 VITALS — BP 130/75 | HR 83 | Ht 66.0 in | Wt 240.0 lb

## 2016-11-21 DIAGNOSIS — R102 Pelvic and perineal pain: Secondary | ICD-10-CM | POA: Diagnosis not present

## 2016-11-21 DIAGNOSIS — R262 Difficulty in walking, not elsewhere classified: Secondary | ICD-10-CM | POA: Diagnosis not present

## 2016-11-21 DIAGNOSIS — M25561 Pain in right knee: Secondary | ICD-10-CM | POA: Insufficient documentation

## 2016-11-21 DIAGNOSIS — M25571 Pain in right ankle and joints of right foot: Secondary | ICD-10-CM | POA: Insufficient documentation

## 2016-11-21 DIAGNOSIS — M25661 Stiffness of right knee, not elsewhere classified: Secondary | ICD-10-CM | POA: Insufficient documentation

## 2016-11-21 DIAGNOSIS — M25671 Stiffness of right ankle, not elsewhere classified: Secondary | ICD-10-CM | POA: Diagnosis not present

## 2016-11-21 MED ORDER — PRENATAL VITAMINS 0.8 MG PO TABS: 1.0000 | ORAL_TABLET | Freq: Every day | ORAL | 12 refills | Status: DC

## 2016-11-21 NOTE — Progress Notes (Signed)
Subjective:     Katherine Mcfarland is a 31 y.o. female here for a routine exam.  G4P4  Current complaints: Pt reports LLQ pain. Pt had an [place] which was WNL. Pt wants to conceive.  Pt had LnIUD removed 02/2016. Pf has been seeing Dr. Shary Decamp for weight loss. Pt reports 24-28 day cycles every month.  Pt has been present for 1 year. Pt has had trich and GC. Last in 2006 or 2008.   Pt has gained 70#'s in 4 years.   Gynecologic History Patient's last menstrual period was 11/14/2016. Contraception: none Last Pap: 09/2016. Results were: normal Last mammogram: never had.   Obstetric History OB History  Gravida Para Term Preterm AB Living  4 4 4     4   SAB TAB Ectopic Multiple Live Births               # Outcome Date GA Lbr Len/2nd Weight Sex Delivery Anes PTL Lv  4 Term           3 Term           2 Term           1 Term                The following portions of the patient's history were reviewed and updated as appropriate: allergies, current medications, past family history, past medical history, past social history, past surgical history and problem list.  Review of Systems Pertinent items are noted in HPI.    Objective:  BP 130/75 (BP Location: Left Arm)   Pulse 83   Ht 5\' 6"  (1.676 m)   Wt 240 lb (108.9 kg)   LMP 11/14/2016   BMI 38.74 kg/m   CONSTITUTIONAL: Well-developed, well-nourished female in no acute distress.  HENT:  Normocephalic, atraumatic EYES: Conjunctivae and EOM are normal. No scleral icterus.  NECK: Normal range of motion SKIN: Skin is warm and dry. No rash noted. Not diaphoretic.No pallor. [place]: Alert and oriented to person, place, and time. Normal coordination.  GU: EGBUS: no lesions Vagina: no blood in vault Cervix: no lesion; no mucopurulent d/c Uterus: small, mobile Adnexa: no masses; sl tender on left side   07/23/2016 CLINICAL DATA:  Left adnexal pain for the past month, increased in intensity for the past week.  EXAM: TRANSABDOMINAL AND  TRANSVAGINAL ULTRASOUND OF PELVIS  TECHNIQUE: Both transabdominal and transvaginal ultrasound examinations of the pelvis were performed. Transabdominal technique was performed for global imaging of the pelvis including uterus, ovaries, adnexal regions, and pelvic cul-de-sac. It was necessary to proceed with endovaginal exam following the transabdominal exam to visualize the uterus and ovaries in better detail.  COMPARISON:  None  FINDINGS: Uterus  Measurements: 8.5 x 5.0 x 4.4 cm. Two small oval myometrial masses, one measuring 1.9 cm in maximum diameter and the other measuring 1.2 cm in maximum diameter  Endometrium  Thickness: 10.2 mm.  No focal abnormality visualized.  Right ovary  Measurements: 3.2 x 1.3 x 2.0 cm. Normal appearance/no adnexal mass.  Left ovary  Measurements: 3.3 x 2.3 x 2.6 cm. Normal appearance/no adnexal mass.  Other findings  No abnormal free fluid.  IMPRESSION: Two small uterine fibroids. Neither has a submucosal component. Otherwise, unremarkable examination.  Assessment:    Healthy female exam.   Pelvic pain on left- suspect due to scar tissue  Prediabetic on Metformin       Plan:    Follow up in: 8 months.    REC  weight loss and control of HgbA1c prior to conception Reviewed weight training and aerobic exercise F/u prn worsening pelvic pain PNV 1 po q day  Total face-to-face time with patient was 25** min.  Greater than 50% was spent in counseling and coordination of care of the patient.   Matilde Markie L. Harraway-Smith, M.D., Cherlynn June

## 2016-11-21 NOTE — Therapy (Signed)
[facility] [address]  [place], [place], [address] Phone: [phone]   Fax:  [phone]  Physical Therapy Treatment  Patient Details  Name: Katherine Mcfarland MRN: [id number] Date of Birth: 1985-08-10 Referring Provider: Pedro Earls, MD  Encounter Date: 11/21/2016      PT End of Session - 11/21/16 0932    Visit Number 5   Number of Visits 9   Date for PT Re-Evaluation 12/14/16   Authorization Type WC   Authorization - Number of Visits 9   PT Start Time 0932   PT Stop Time 1015   PT Time Calculation (min) 43 min   Activity Tolerance Patient tolerated treatment well   Behavior During Therapy [facility] for tasks assessed/performed      Past Medical History:  Diagnosis Date  . Allergy    pt. reports seasonal allergies  . Headache   . Lactose intolerance   . Palpitations   . Right knee pain   . Tinnitus     Past Surgical History:  Procedure Laterality Date  . CESAREAN SECTION    . WISDOM TOOTH EXTRACTION      There were no vitals filed for this visit.      Subjective Assessment - 11/21/16 0935    Subjective "Doing good". Still notes issues with tightness (feeling of need for ankle to "pop") ~2x/wk.   Pertinent History R knee bursitis ~4 yrs ago   Patient Stated Goals "to relieve pressure and pain so I can walk better"   Currently in Pain? No/denies            [facility] PT Assessment - 11/21/16 0932      Assessment   Medical Diagnosis R ankle sprain/pain, R knee pain   Referring Provider Pedro Earls, MD   Onset Date/Surgical Date --  end of March   Next MD Visit 11/28/16     AROM   Right Knee Extension -1   Right Knee Flexion 134   Right Ankle Dorsiflexion 10   Right Ankle Plantar Flexion 41   Right Ankle Inversion 32   Right Ankle Eversion 28   Left Ankle Dorsiflexion 14   Left Ankle Plantar Flexion 40   Left Ankle Inversion 34   Left Ankle Eversion 27     Strength   Right  Hip Flexion 4+/5   Right Hip Extension 4+/5   Right Hip External Rotation  4+/5   Right Hip Internal Rotation 4+/5   Right Hip ABduction 4+/5   Right Hip ADduction 4/5   Left Hip Flexion 5/5   Left Hip Extension 4+/5   Left Hip External Rotation 4+/5   Left Hip Internal Rotation 5/5   Left Hip ABduction 5/5   Left Hip ADduction 4+/5   Right Knee Flexion 5/5   Right Knee Extension 5/5   Left Knee Flexion 5/5   Left Knee Extension 5/5   Right Ankle Dorsiflexion 4+/5   Right Ankle Plantar Flexion 5/5   Right Ankle Inversion 4+/5   Right Ankle Eversion 4+/5   Left Ankle Dorsiflexion 5/5   Left Ankle Plantar Flexion 5/5   Left Ankle Inversion 5/5   Left Ankle Eversion 5/5                     [facility] Adult PT Treatment/Exercise - 11/21/16 0932      Knee/Hip Exercises: Aerobic   Recumbent Bike L3 x 6'     Knee/Hip  Exercises: Standing   Forward Lunges Both;10 reps;2 seconds   Forward Lunges Limitations on foam balance beam   Functional Squat 15 reps;3 seconds   Functional Squat Limitations on BOSU (inverted)   Other Standing Knee Exercises B side-stepping (green TB at forefoot) & fwd/back monster walk (green TB at ankles) x 40 ft each     Ankle Exercises: Standing   Balance Beam sidestepping on foam balance beam: feet centered on beam x2, heels on beam x2, forefoot on beam x2; tandem gait fwd/back on foam x2                     PT Long Term Goals - 11/21/16 8546      PT LONG TERM GOAL #1   Title Independent with HEP by 12/14/16   Status Partially Met  met for current      PT LONG TERM GOAL #2   Title R ankle DF AROM >/= 10 dg and eversion AROM >/= 20 dg w/o increased pain by 11/16/16   Status Achieved     PT LONG TERM GOAL #3   Title R knee AROM equivalent to L knee w/o increased pain by 11/16/16   Status Achieved     PT LONG TERM GOAL #4   Title R LE strength >/= 4/5 to 4+/5 by 11/16/16   Status Achieved     PT LONG TERM GOAL #5   Title Pt will  report ability to complete household chores, job tasks and normal recreational activities w/o limitation due to R knee or ankle pain by 12/14/16   Status Partially Met               Plan - 11/21/16 0939    Clinical Impression Statement Pt demonstrating excellent progress with PT thus far. R knee and ankle ROM now [facility] and equivalent to L, with overall R LE strength now >/= 4/5. Pt continues to report intermittent limitation in daily activities due to R ankle pain and instability, thus will continue PT with focus on proprioceptive training for ankle.   Rehab Potential Good   Clinical Impairments Affecting Rehab Potential h/o R knee bursitis ~4 yrs ago, obesity   PT Treatment/Interventions Patient/family education;Therapeutic exercise;Neuromuscular re-education;Balance training;Gait training;Stair training;Manual techniques;Taping;Dry needling;Electrical Stimulation;Cryotherapy;Vasopneumatic Device;Moist Heat;Iontophoresis 84m/ml Dexamethasone;ADLs/Self Care Home Management   PT Next Visit Plan LE/core strengthening; proprioceptive training; manual therapy including taping as indicated; modalities PRN   Consulted and Agree with Plan of Care Patient      Patient will benefit from skilled therapeutic intervention in order to improve the following deficits and impairments:  Pain, Impaired flexibility, Increased muscle spasms, Decreased range of motion, Decreased strength, Difficulty walking, Abnormal gait, Decreased activity tolerance  Visit Diagnosis: Pain in right ankle and joints of right foot  Acute pain of right knee  Stiffness of right ankle, not elsewhere classified  Stiffness of right knee, not elsewhere classified  Difficulty in walking, not elsewhere classified     Problem List Patient Active Problem List   Diagnosis Date Noted  . Vitamin D deficiency 11/07/2016  . Prediabetes 11/07/2016  . Class 2 obesity with serious comorbidity and body mass index (BMI) of 37.0 to 37.9  in adult 11/07/2016  . Anxiety 09/25/2016  . Palpitations 02/07/2016  . Severe obesity (BMI >= 40) ([place] 02/07/2016  . Tinnitus of right ear 01/25/2016  . Migraine 10/26/2014  . 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 [address]  [place], [place], [address] Phone: [phone]   Fax:  [phone]  Name: NAOMI CASTROGIOVANNI MRN: [id number] Date of Birth: 01/20/1986

## 2016-11-23 ENCOUNTER — Encounter: Payer: Self-pay | Admitting: Obstetrics & Gynecology

## 2016-11-23 ENCOUNTER — Ambulatory Visit: Payer: PRIVATE HEALTH INSURANCE | Admitting: Physical Therapy

## 2016-11-23 DIAGNOSIS — R262 Difficulty in walking, not elsewhere classified: Secondary | ICD-10-CM | POA: Diagnosis not present

## 2016-11-23 DIAGNOSIS — M25661 Stiffness of right knee, not elsewhere classified: Secondary | ICD-10-CM

## 2016-11-23 DIAGNOSIS — M25571 Pain in right ankle and joints of right foot: Secondary | ICD-10-CM | POA: Diagnosis not present

## 2016-11-23 DIAGNOSIS — M25671 Stiffness of right ankle, not elsewhere classified: Secondary | ICD-10-CM | POA: Diagnosis not present

## 2016-11-23 DIAGNOSIS — M25561 Pain in right knee: Secondary | ICD-10-CM

## 2016-11-23 NOTE — Therapy (Addendum)
[facility] [address]  [place], [place], [address] Phone: [phone]   Fax:  [phone]  Physical Therapy Treatment  Patient Details  Name: Katherine Mcfarland MRN: [id number] Date of Birth: 1986/05/14 Referring Provider: Pedro Earls, MD  Encounter Date: 11/23/2016      PT End of Session - 11/23/16 1100    Visit Number 6   Number of Visits 9   Date for PT Re-Evaluation 12/14/16   Authorization Type WC   Authorization - Number of Visits 9   PT Start Time 1100   PT Stop Time 1144   PT Time Calculation (min) 44 min   Activity Tolerance Patient tolerated treatment well   Behavior During Therapy [facility] for tasks assessed/performed      Past Medical History:  Diagnosis Date  . Allergy    pt. reports seasonal allergies  . Headache   . Lactose intolerance   . Palpitations   . Right knee pain   . Tinnitus     Past Surgical History:  Procedure Laterality Date  . CESAREAN SECTION    . WISDOM TOOTH EXTRACTION      There were no vitals filed for this visit.      Subjective Assessment - 11/23/16 1103    Subjective Pt reporting that while stepping up into a pick-up truck Wed evening, she felt/heard a sharp "pop" in her R ankle. Short-lived pain associated with this, but completely resolved and reamins painfree.   Pertinent History R knee bursitis ~4 yrs ago   Patient Stated Goals "to relieve pressure and pain so I can walk better"   Currently in Pain? No/denies   Pain Score 0-No pain            [facility] PT Assessment - 11/23/16 0001      Assessment   Medical Diagnosis R ankle sprain/pain, R knee pain   Referring Provider Pedro Earls, MD   Onset Date/Surgical Date --  end of March   Next MD Visit 11/28/16     AROM   Right Knee Extension -1   Right Knee Flexion 134   Right Ankle Dorsiflexion 10   Right Ankle Plantar Flexion 41   Right Ankle Inversion 32   Right Ankle Eversion 28   Left  Ankle Dorsiflexion 14   Left Ankle Plantar Flexion 40   Left Ankle Inversion 34   Left Ankle Eversion 27     Strength   Right Hip Flexion 4+/5   Right Hip Extension 4+/5   Right Hip External Rotation  4+/5   Right Hip Internal Rotation 4+/5   Right Hip ABduction 4+/5   Right Hip ADduction 4/5   Left Hip Flexion 5/5   Left Hip Extension 4+/5   Left Hip External Rotation 4+/5   Left Hip Internal Rotation 5/5   Left Hip ABduction 5/5   Left Hip ADduction 4+/5   Right Knee Flexion 5/5   Right Knee Extension 5/5   Left Knee Flexion 5/5   Left Knee Extension 5/5   Right Ankle Dorsiflexion 4+/5   Right Ankle Plantar Flexion 5/5   Right Ankle Inversion 4+/5   Right Ankle Eversion 4+/5   Left Ankle Dorsiflexion 5/5   Left Ankle Plantar Flexion 5/5   Left Ankle Inversion 5/5   Left Ankle Eversion 5/5                     [facility] Adult PT Treatment/Exercise -  11/23/16 1100      Knee/Hip Exercises: Aerobic   Recumbent Bike L3 x 6'     Knee/Hip Exercises: Standing   Functional Squat 15 reps;3 seconds   Functional Squat Limitations on BOSU (inverted)   SLS R SLS on blue foam oval - B rotational stability with green TB pallof press x10 each   Other Standing Knee Exercises Walking lunge 2 x 30'   Other Standing Knee Exercises Fitter B hip abduction (1 black/1 blue) and extension (L - 1 black/1 blue, R - 1 black) x15 each     Ankle Exercises: Standing   Vector Stance Right   Vector Stance Limitations SLS on inverted BOSU with 4 cone tap outside BOS x10   SLS R SLS on inverted BOSU - 3 x 30"   Balance Beam B sidestepping with cone knock-down/righting x2 passes                     PT Long Term Goals - 11/21/16 3419      PT LONG TERM GOAL #1   Title Independent with HEP by 12/14/16   Status Partially Met  met for current      PT LONG TERM GOAL #2   Title R ankle DF AROM >/= 10 dg and eversion AROM >/= 20 dg w/o increased pain by 11/16/16   Status Achieved      PT LONG TERM GOAL #3   Title R knee AROM equivalent to L knee w/o increased pain by 11/16/16   Status Achieved     PT LONG TERM GOAL #4   Title R LE strength >/= 4/5 to 4+/5 by 11/16/16   Status Achieved     PT LONG TERM GOAL #5   Title Pt will report ability to complete household chores, job tasks and normal recreational activities w/o limitation due to R knee or ankle pain by 12/14/16   Status Partially Met               Plan - 11/23/16 1107    Clinical Impression Statement Pt demonstrating excellent progress with PT thus far. R knee and ankle ROM now [facility] and equivalent to L, with overall R LE strength now >/= 4/5. Pt mostly pain free but continues to note lateral R knee pain with some squatting activities due to continued tendency for lateral patellar tracking secondary to tightness in lateral muscular and decreased VMO activation. Pt also continues to report intermittent limitation in daily activities due to R ankle pain and instability, thus recommend continued PT to focus on VMO activation and proprioceptive training to improve knee/ankle stability for return to normal function w/o pain.   Rehab Potential Good   Clinical Impairments Affecting Rehab Potential h/o R knee bursitis ~4 yrs ago, obesity   PT Treatment/Interventions Patient/family education;Therapeutic exercise;Neuromuscular re-education;Balance training;Gait training;Stair training;Manual techniques;Taping;Dry needling;Electrical Stimulation;Cryotherapy;Vasopneumatic Device;Moist Heat;Iontophoresis 4m/ml Dexamethasone;ADLs/Self Care Home Management   PT Next Visit Plan LE/core strengthening; proprioceptive training; manual therapy including taping as indicated; modalities PRN   Consulted and Agree with Plan of Care Patient      Patient will benefit from skilled therapeutic intervention in order to improve the following deficits and impairments:  Pain, Impaired flexibility, Increased muscle spasms, Decreased range of  motion, Decreased strength, Difficulty walking, Abnormal gait, Decreased activity tolerance  Visit Diagnosis: Pain in right ankle and joints of right foot  Acute pain of right knee  Stiffness of right ankle, not elsewhere classified  Stiffness of right knee, not  elsewhere classified  Difficulty in walking, not elsewhere classified     Problem List Patient Active Problem List   Diagnosis Date Noted  . Vitamin D deficiency 11/07/2016  . Prediabetes 11/07/2016  . Class 2 obesity with serious comorbidity and body mass index (BMI) of 37.0 to 37.9 in adult 11/07/2016  . Anxiety 09/25/2016  . Palpitations 02/07/2016  . Severe obesity (BMI >= 40) ([place]) 02/07/2016  . Tinnitus of right ear 01/25/2016  . Migraine 10/26/2014  . Atypical squamous cell changes of undetermined significance (ASCUS) on cervical cytology with positive high risk human papilloma virus (HPV) 11/03/2013    Percival Spanish, PT, MPT 11/23/2016, 12:03 PM  [facility] [address]  [place], [place], [address] Phone: [phone]   Fax:  [phone]  Name: Katherine Mcfarland MRN: [id number] Date of Birth: 11-06-1985  PHYSICAL THERAPY DISCHARGE SUMMARY  Visits from Start of Care: 6  Current functional level related to goals / functional outcomes:   Unable to formally assess as pt did not return for remaining visits in POC, but refer to above clinical impression for status as of last visit.   Remaining deficits:   As above.   Education / Equipment:   HEP  Plan: Patient agrees to discharge.  Patient goals were partially met. Patient is being discharged due to not returning since the last visit.  ?????     Percival Spanish, PT, MPT 12/27/16, 10:16 AM  [facility] [address]  [place], [place], [address] Phone: [phone]   Fax:  [phone]

## 2016-11-28 ENCOUNTER — Ambulatory Visit (INDEPENDENT_AMBULATORY_CARE_PROVIDER_SITE_OTHER): Payer: 59 | Admitting: Physician Assistant

## 2016-11-28 VITALS — BP 131/82 | HR 85 | Temp 98.7°F | Ht 66.0 in | Wt 235.0 lb

## 2016-11-28 DIAGNOSIS — E559 Vitamin D deficiency, unspecified: Secondary | ICD-10-CM

## 2016-11-28 DIAGNOSIS — E669 Obesity, unspecified: Secondary | ICD-10-CM | POA: Diagnosis not present

## 2016-11-28 DIAGNOSIS — R7303 Prediabetes: Secondary | ICD-10-CM | POA: Diagnosis not present

## 2016-11-28 DIAGNOSIS — Z9189 Other specified personal risk factors, not elsewhere classified: Secondary | ICD-10-CM

## 2016-11-28 DIAGNOSIS — Z6837 Body mass index (BMI) 37.0-37.9, adult: Secondary | ICD-10-CM

## 2016-11-28 MED ORDER — VITAMIN D (ERGOCALCIFEROL) 1.25 MG (50000 UNIT) PO CAPS: 50000.0000 [IU] | ORAL_CAPSULE | ORAL | 0 refills | Status: DC

## 2016-11-28 MED FILL — VIT D2 1.25 MG (50,000 UNIT: 1.25 MG | 28 days supply | Qty: 4 | Fill #0

## 2016-11-29 NOTE — Progress Notes (Signed)
Office: [phone]  /  Fax: [phone]   HPI:   Chief Complaint: OBESITY Katherine Mcfarland is here to discuss her progress with her obesity treatment plan. She is on the  follow the Category 3 plan and is following her eating plan approximately 75 % of the time. She states she is walks for exercise 15 minutes 4 times per week also walks a lot at work. Katherine Mcfarland was sick with URI and could not follow the meal plan and was skipping meals. She is now better and is motivated to get back on track. Her weight is 235 lb (106.6 kg) today and has gained 1 pound since her last visit. She has lost 6 lbs since starting treatment with [place].  Vitamin D deficiency Katherine Mcfarland has a diagnosis of vitamin D deficiency. She is currently taking vit D and denies nausea, vomiting or muscle weakness.  Pre-Diabetes Katherine Mcfarland has a diagnosis of prediabetes based on her elevated HgA1c and was informed this puts her at greater risk of developing diabetes. She is taking metformin currently and continues to work on diet and exercise to decrease risk of diabetes. She denies nausea or hypoglycemia.  At risk for diabetes Katherine Mcfarland is at higher than averagerisk for developing diabetes due to her obesity. She currently denies polyuria or polydipsia.   ALLERGIES: Allergies  Allergen Reactions  . Metronidazole Nausea And Vomiting  . Mushroom Extract Complex Diarrhea    MEDICATIONS: Current Outpatient Prescriptions on File Prior to Visit  Medication Sig Dispense Refill  . fluticasone (FLONASE) 50 MCG/ACT nasal spray Place 2 sprays into both nostrils daily. 16 g 6  . levocetirizine (XYZAL) 5 MG tablet Take 5 mg by mouth every evening. Take 10mg  prn    . metFORMIN (GLUCOPHAGE) 500 MG tablet Take 1 tablet (500 mg total) by mouth daily after breakfast. 30 tablet 0  . montelukast (SINGULAIR) 10 MG tablet Take 1 tablet by mouth daily.  5  . Norethindrone Acetate-Ethinyl Estrad-FE (LOESTRIN 24 FE) 1-20 MG-MCG(24) tablet Take 1 tablet  by mouth daily. 3 Package 11  . Prenatal Multivit-Min-Fe-FA (PRENATAL VITAMINS) 0.8 MG tablet Take 1 tablet by mouth daily. 30 tablet 12  . SUMAtriptan (IMITREX) 100 MG tablet Take one tablet at onset of headache. May repeat in 2 hours. No more than 2 pills in 24 hours.   Follow-up with me on Monday to see how this is working. 10 tablet 0   No current facility-administered medications on file prior to visit.     PAST MEDICAL HISTORY: Past Medical History:  Diagnosis Date  . Allergy    pt. reports seasonal allergies  . Headache   . Lactose intolerance   . Palpitations   . Right knee pain   . Tinnitus     PAST SURGICAL HISTORY: Past Surgical History:  Procedure Laterality Date  . CESAREAN SECTION    . WISDOM TOOTH EXTRACTION      SOCIAL HISTORY: Social History  Substance Use Topics  . Smoking status: Never Smoker  . Smokeless tobacco: Never Used  . Alcohol use Yes     Comment: social    FAMILY HISTORY: Family History  Problem Relation Age of Onset  . Diabetes Maternal Grandmother   . Hypertension Maternal Grandmother   . Hypertension Mother   . Hyperlipidemia Mother   . Hypertension Father     ROS: Review of Systems  Gastrointestinal: Negative for nausea and vomiting.  Musculoskeletal: Negative.        Muscle weakness  Endo/Heme/Allergies: Negative.  Polyphagia    PHYSICAL EXAM: Blood pressure 131/82, pulse 85, temperature 98.7 F (37.1 C), temperature source Oral, height 5\' 6"  (1.676 m), weight 235 lb (106.6 kg), last menstrual period 11/14/2016, SpO2 99 %. Body mass index is 37.93 kg/m. Physical Exam  Constitutional: She appears well-developed and well-nourished.  Cardiovascular: Normal rate.   Pulmonary/Chest: Effort normal.  Musculoskeletal: Normal range of motion.  Neurological: She is alert.  Skin: Skin is warm and dry.    RECENT LABS AND TESTS: BMET    Component Value Date/Time   NA 140 09/26/2016 0946   K 4.1 09/26/2016 0946   CL  101 09/26/2016 0946   CO2 23 09/26/2016 0946   GLUCOSE 114 (H) 09/26/2016 0946   GLUCOSE 109 (H) 01/04/2016 0802   BUN 12 09/26/2016 0946   CREATININE 0.85 09/26/2016 0946   CREATININE 0.86 10/26/2014 1502   CALCIUM 9.4 09/26/2016 0946   GFRNONAA 90 09/26/2016 0946   GFRNONAA >89 10/26/2014 1502   GFRAA 104 09/26/2016 0946   GFRAA >89 10/26/2014 1502   Lab Results  Component Value Date   HGBA1C 6.1 (H) 09/26/2016   Lab Results  Component Value Date   INSULIN 23.3 09/26/2016   CBC    Component Value Date/Time   WBC WILL FOLLOW 11/01/2016 1216   WBC 5.9 01/04/2016 0802   RBC WILL FOLLOW 11/01/2016 1216   RBC 4.63 01/04/2016 0802   HGB WILL FOLLOW 11/01/2016 1216   HCT WILL FOLLOW 11/01/2016 1216   PLT WILL FOLLOW 11/01/2016 1216   MCV WILL FOLLOW 11/01/2016 1216   MCH WILL FOLLOW 11/01/2016 1216   MCH 27.0 10/26/2014 1502   MCHC WILL FOLLOW 11/01/2016 1216   MCHC 33.2 01/04/2016 0802   RDW WILL FOLLOW 11/01/2016 1216   LYMPHSABS WILL FOLLOW 11/01/2016 1216   MONOABS 0.4 01/04/2016 0802   EOSABS WILL FOLLOW 11/01/2016 1216   BASOSABS WILL FOLLOW 11/01/2016 1216   Iron/TIBC/Ferritin/ %Sat No results found for: IRON, TIBC, FERRITIN, IRONPCTSAT Lipid Panel     Component Value Date/Time   CHOL 171 09/26/2016 0946   TRIG 96 09/26/2016 0946   HDL 45 09/26/2016 0946   CHOLHDL 4 01/04/2016 0802   VLDL 18.2 01/04/2016 0802   LDLCALC 107 (H) 09/26/2016 0946   Hepatic Function Panel     Component Value Date/Time   PROT 7.2 09/26/2016 0946   ALBUMIN 4.0 09/26/2016 0946   AST 20 09/26/2016 0946   ALT 19 09/26/2016 0946   ALKPHOS 73 09/26/2016 0946   BILITOT 0.3 09/26/2016 0946      Component Value Date/Time   TSH 1.150 09/26/2016 0946   TSH 1.10 02/09/2016 1127   TSH 1.73 01/04/2016 0802    ASSESSMENT AND PLAN: Vitamin D deficiency - Plan: Vitamin D, Ergocalciferol, (DRISDOL) 50000 units CAPS capsule  Prediabetes  At risk for diabetes mellitus  Class 2  obesity without serious comorbidity with body mass index (BMI) of 37.0 to 37.9 in adult, unspecified obesity type  PLAN:  Vitamin D Deficiency Katherine Mcfarland was informed that low vitamin D levels contributes to fatigue and are associated with obesity, breast, and colon cancer. She agrees to continue to take prescription Vit D @50 ,000 IU every week, a refill was written today, and will follow up for routine testing of vitamin D, at least 2-3 times per year. She was informed of the risk of over-replacement of vitamin D and agrees to not increase her dose unless he discusses this with [place] first.  Pre-Diabetes Katherine Mcfarland will continue  to work on weight loss, exercise, and decreasing simple carbohydrates in her diet to help decrease the risk of diabetes. We dicussed metformin including benefits and risks. She was informed that eating too many simple carbohydrates or too many calories at one sitting increases the likelihood of GI side effects. Katherine Mcfarland requested metformin for now and a prescription was not written today. Katherine Mcfarland agreed to follow up with [place] as directed to monitor her progress.  Diabetes risk counselling Katherine Mcfarland was given extended (15 minutes) diabetes prevention counseling today. She is 31 y.o. female and has risk factors for diabetes including obesity. We discussed intensive lifestyle modifications today with an emphasis on weight loss as well as increasing exercise and decreasing simple carbohydrates in her diet.  Obesity Katherine Mcfarland is currently in the action stage of change. As such, her goal is to continue with weight loss efforts She has agreed to follow the Category 3 plan Katherine Mcfarland has been instructed to work up to a goal of 150 minutes of combined cardio and strengthening exercise per week for weight loss and overall health benefits. We discussed the following Behavioral Modification Stratagies today: increasing lean protein intake and avoid skipping meals   Katherine Mcfarland has agreed to  follow up with our clinic in 2 weeks. She was informed of the importance of frequent follow up visits to maximize her success with intensive lifestyle modifications for her multiple health conditions.   Office: [phone]  /  Fax: [phone]  OBESITY BEHAVIORAL INTERVENTION VISIT  Today's visit was # 5 out of 22.  Starting weight: 241 Starting date: 09/26/16 Today's weight : Weight: 235 lb (106.6 kg)  Today's date: 11/29/2016 Total lbs lost to date: 6 (Patients must lose 7 lbs in the first 6 months to continue with counseling)   ASK: We discussed the diagnosis of obesity with Katherine Mcfarland today and Katherine Mcfarland agreed to give [place] permission to discuss obesity behavioral modification therapy today.  ASSESS: Katherine Mcfarland has the diagnosis of obesity and her BMI today is 85 Katherine Mcfarland is in the action stage of change   ADVISE: Katherine Mcfarland was educated on the multiple health risks of obesity as well as the benefit of weight loss to improve her health. She was advised of the need for long term treatment and the importance of lifestyle modifications.  AGREE: Multiple dietary modification options and treatment options were discussed and  Katherine Mcfarland agreed to follow the Category 3 plan We discussed the following Behavioral Modification Stratagies today: increasing lean protein intake      I have reviewed the above documentation for accuracy and completeness, and I agree with the above. -Lacy Duverney, PA-C  I have reviewed the above note and agree with the plan. -Dennard Nip, MD

## 2016-11-30 ENCOUNTER — Other Ambulatory Visit: Payer: Self-pay

## 2016-11-30 ENCOUNTER — Ambulatory Visit: Payer: PRIVATE HEALTH INSURANCE | Admitting: Physical Therapy

## 2016-11-30 MED ORDER — FLUCONAZOLE 150 MG PO TABS: ORAL_TABLET | ORAL | 2 refills | Status: DC

## 2016-11-30 MED FILL — FLUCONAZOLE 150 MG TABLET: 150 | 28 days supply | Qty: 4 | Fill #0

## 2016-12-05 ENCOUNTER — Ambulatory Visit: Payer: 59 | Admitting: Psychology

## 2016-12-12 ENCOUNTER — Ambulatory Visit (INDEPENDENT_AMBULATORY_CARE_PROVIDER_SITE_OTHER): Payer: 59 | Admitting: Physician Assistant

## 2016-12-12 VITALS — BP 128/83 | HR 86 | Temp 98.3°F | Ht 66.0 in | Wt 234.0 lb

## 2016-12-12 DIAGNOSIS — Z6837 Body mass index (BMI) 37.0-37.9, adult: Secondary | ICD-10-CM | POA: Diagnosis not present

## 2016-12-12 DIAGNOSIS — R7303 Prediabetes: Secondary | ICD-10-CM

## 2016-12-12 DIAGNOSIS — E669 Obesity, unspecified: Secondary | ICD-10-CM

## 2016-12-12 DIAGNOSIS — IMO0001 Reserved for inherently not codable concepts without codable children: Secondary | ICD-10-CM

## 2016-12-12 NOTE — Progress Notes (Signed)
Office: [phone]  /  Fax: [phone]   HPI:   Chief Complaint: OBESITY Katherine Mcfarland is here to discuss her progress with her obesity treatment plan. She is on the  keep a food journal with 550-650 calories and 40+g protein  and follow the Category 3 plan and is following her eating plan approximately 60 % of the time. She states she is exercising 0 minutes 0 times per week. [name] continues to do well with weight loss. She plans ahead well and making smarter choices and control her portions. Would like more variety at supper.  Her weight is 234 lb (106.1 kg) today and has had a weight loss of 1 pounds over a period of 2 weeks since her last visit. She has lost 7 lbs since starting treatment with [place].  Pre-Diabetes Katherine Mcfarland will continue to work on weight loss, exercise, and decreasing simple carbohydrates in her diet to help decrease the risk of diabetes. We dicussed metformin including benefits and risks. She was informed that eating too many simple carbohydrates or too many calories at one sitting increases the likelihood of GI side effects. Jakyra requested metformin for now and a prescription was not written today.She states diarrhea from Metformin has improved.  Corbyn agreed to follow up with [place] as directed to monitor her progress.    ALLERGIES: Allergies  Allergen Reactions  . Metronidazole Nausea And Vomiting  . Mushroom Extract Complex Diarrhea    MEDICATIONS: Current Outpatient Prescriptions on File Prior to Visit  Medication Sig Dispense Refill  . fluconazole (DIFLUCAN) 150 MG tablet Take one by mouth weekly as needed. 12 tablet 2  . fluticasone (FLONASE) 50 MCG/ACT nasal spray Place 2 sprays into both nostrils daily. 16 g 6  . levocetirizine (XYZAL) 5 MG tablet Take 5 mg by mouth every evening. Take 10mg  prn    . metFORMIN (GLUCOPHAGE) 500 MG tablet Take 1 tablet (500 mg total) by mouth daily after breakfast. 30 tablet 0  . montelukast (SINGULAIR) 10 MG tablet Take  1 tablet by mouth daily.  5  . Norethindrone Acetate-Ethinyl Estrad-FE (LOESTRIN 24 FE) 1-20 MG-MCG(24) tablet Take 1 tablet by mouth daily. 3 Package 11  . Prenatal Multivit-Min-Fe-FA (PRENATAL VITAMINS) 0.8 MG tablet Take 1 tablet by mouth daily. 30 tablet 12  . SUMAtriptan (IMITREX) 100 MG tablet Take one tablet at onset of headache. May repeat in 2 hours. No more than 2 pills in 24 hours.   Follow-up with me on Monday to see how this is working. 10 tablet 0  . Vitamin D, Ergocalciferol, (DRISDOL) 50000 units CAPS capsule Take 1 capsule (50,000 Units total) by mouth every 7 (seven) days. 4 capsule 0   No current facility-administered medications on file prior to visit.     PAST MEDICAL HISTORY: Past Medical History:  Diagnosis Date  . Allergy    pt. reports seasonal allergies  . Headache   . Lactose intolerance   . Palpitations   . Right knee pain   . Tinnitus     PAST SURGICAL HISTORY: Past Surgical History:  Procedure Laterality Date  . CESAREAN SECTION    . WISDOM TOOTH EXTRACTION      SOCIAL HISTORY: Social History  Substance Use Topics  . Smoking status: Never Smoker  . Smokeless tobacco: Never Used  . Alcohol use Yes     Comment: social    FAMILY HISTORY: Family History  Problem Relation Age of Onset  . Diabetes Maternal Grandmother   . Hypertension Maternal Grandmother   .  Hypertension Mother   . Hyperlipidemia Mother   . Hypertension Father     ROS: Review of Systems  Endo/Heme/Allergies:       Negative polyphagia    PHYSICAL EXAM: Blood pressure 128/83, pulse 86, temperature 98.3 F (36.8 C), temperature source Oral, height 5\' 6"  (1.676 m), weight 234 lb (106.1 kg), last menstrual period 11/14/2016, SpO2 99 %. Body mass index is 37.77 kg/m. Physical Exam  Constitutional: She is oriented to person, place, and time. She appears well-developed and well-nourished.  Cardiovascular: Normal rate.   Pulmonary/Chest: Effort normal.  Musculoskeletal:  Normal range of motion.  Neurological: She is alert and oriented to person, place, and time.  Skin: Skin is warm and dry.  Psychiatric: She has a normal mood and affect.    RECENT LABS AND TESTS: BMET    Component Value Date/Time   NA 140 09/26/2016 0946   K 4.1 09/26/2016 0946   CL 101 09/26/2016 0946   CO2 23 09/26/2016 0946   GLUCOSE 114 (H) 09/26/2016 0946   GLUCOSE 109 (H) 01/04/2016 0802   BUN 12 09/26/2016 0946   CREATININE 0.85 09/26/2016 0946   CREATININE 0.86 10/26/2014 1502   CALCIUM 9.4 09/26/2016 0946   GFRNONAA 90 09/26/2016 0946   GFRNONAA >89 10/26/2014 1502   GFRAA 104 09/26/2016 0946   GFRAA >89 10/26/2014 1502   Lab Results  Component Value Date   HGBA1C 6.1 (H) 09/26/2016   Lab Results  Component Value Date   INSULIN 23.3 09/26/2016   CBC    Component Value Date/Time   WBC WILL FOLLOW 11/01/2016 1216   WBC 5.9 01/04/2016 0802   RBC WILL FOLLOW 11/01/2016 1216   RBC 4.63 01/04/2016 0802   HGB WILL FOLLOW 11/01/2016 1216   HCT WILL FOLLOW 11/01/2016 1216   PLT WILL FOLLOW 11/01/2016 1216   MCV WILL FOLLOW 11/01/2016 1216   MCH WILL FOLLOW 11/01/2016 1216   MCH 27.0 10/26/2014 1502   MCHC WILL FOLLOW 11/01/2016 1216   MCHC 33.2 01/04/2016 0802   RDW WILL FOLLOW 11/01/2016 1216   LYMPHSABS WILL FOLLOW 11/01/2016 1216   MONOABS 0.4 01/04/2016 0802   EOSABS WILL FOLLOW 11/01/2016 1216   BASOSABS WILL FOLLOW 11/01/2016 1216   Iron/TIBC/Ferritin/ %Sat No results found for: IRON, TIBC, FERRITIN, IRONPCTSAT Lipid Panel     Component Value Date/Time   CHOL 171 09/26/2016 0946   TRIG 96 09/26/2016 0946   HDL 45 09/26/2016 0946   CHOLHDL 4 01/04/2016 0802   VLDL 18.2 01/04/2016 0802   LDLCALC 107 (H) 09/26/2016 0946   Hepatic Function Panel     Component Value Date/Time   PROT 7.2 09/26/2016 0946   ALBUMIN 4.0 09/26/2016 0946   AST 20 09/26/2016 0946   ALT 19 09/26/2016 0946   ALKPHOS 73 09/26/2016 0946   BILITOT 0.3 09/26/2016 0946       Component Value Date/Time   TSH 1.150 09/26/2016 0946   TSH 1.10 02/09/2016 1127   TSH 1.73 01/04/2016 0802    ASSESSMENT AND PLAN: Prediabetes  Class 2 obesity with serious comorbidity and body mass index (BMI) of 37.0 to 37.9 in adult, unspecified obesity type  PLAN:  Pre-Diabetes Sally-Anne will continue to work on weight loss, exercise, and decreasing simple carbohydrates in her diet to help decrease the risk of diabetes. We dicussed metformin including benefits and risks. She was informed that eating too many simple carbohydrates or too many calories at one sitting increases the likelihood of GI side effects.  Tashonna requested metformin for now and a prescription was not written today. Leyan agreed to follow up with [place] as directed to monitor her progress.   Obesity Locklyn is currently in the action stage of change. As such, her goal is to continue with weight loss efforts She has agreed to keep a food journal with 550-650 calories and 40+g protein  and follow the Category 3 plan Chade has been instructed to work up to a goal of 150 minutes of combined cardio and strengthening exercise per week for weight loss and overall health benefits. We discussed the following Behavioral Modification Stratagies today: increasing lean protein intake and work on meal planning and easy cooking plans and keep a strict food journal.   Filomena has agreed to follow up with our clinic in 3 weeks. She was informed of the importance of frequent follow up visits to maximize her success with intensive lifestyle modifications for her multiple health conditions. . We spent > than 50% of the 15 minute visit on the counseling as documented in the note.   Office: [phone]  /  Fax: [phone]  OBESITY BEHAVIORAL INTERVENTION VISIT  Today's visit was # 7 out of 22.  Starting weight: 241 Starting date: 09/26/16 Today's weight : Weight: 234 lb (106.1 kg)  Today's date: 12/12/2016 Total  lbs lost to date: 7 (Patients must lose 7 lbs in the first 6 months to continue with counseling)   ASK: We discussed the diagnosis of obesity with [name] today and Aleni agreed to give [place] permission to discuss obesity behavioral modification therapy today.  ASSESS: Khylei has the diagnosis of obesity and her BMI today is 81.8 Tonyetta is in the action stage of change   ADVISE: Jkayla was educated on the multiple health risks of obesity as well as the benefit of weight loss to improve her health. She was advised of the need for long term treatment and the importance of lifestyle modifications.  AGREE: Multiple dietary modification options and treatment options were discussed and  Lovell agreed to keep a food journal with 550-650 calories and 40+ protein  and follow the Category 3 plan We discussed the following Behavioral Modification Stratagies today: increasing lean protein intake and work on meal planning and easy cooking plans and keeping a strict food journal.     I have reviewed the above documentation for accuracy and completeness, and I agree with the above. -Lacy Duverney, PA-C  I have reviewed the above note and agree with the plan. -Dennard Nip, MD

## 2016-12-18 ENCOUNTER — Telehealth: Payer: Self-pay

## 2016-12-18 ENCOUNTER — Other Ambulatory Visit (HOSPITAL_COMMUNITY)
Admission: RE | Admit: 2016-12-18 | Discharge: 2016-12-18 | Disposition: A | Discharge status: Home / Self Care | Payer: 59 | Source: Ambulatory Visit | Attending: Family Medicine | Admitting: Family Medicine

## 2016-12-18 ENCOUNTER — Other Ambulatory Visit: Payer: Self-pay | Admitting: Family Medicine

## 2016-12-18 DIAGNOSIS — L298 Other pruritus: Secondary | ICD-10-CM | POA: Diagnosis not present

## 2016-12-18 DIAGNOSIS — R309 Painful micturition, unspecified: Secondary | ICD-10-CM

## 2016-12-18 DIAGNOSIS — N898 Other specified noninflammatory disorders of vagina: Secondary | ICD-10-CM

## 2016-12-18 NOTE — Telephone Encounter (Signed)
Patient c/o pain when voiding, along with itching and burning. Order entered for UA. Patient have no further concerns at this time. LB

## 2016-12-20 LAB — URINE CYTOLOGY ANCILLARY ONLY
Chlamydia: NEGATIVE
NEISSERIA GONORRHEA: NEGATIVE
Trichomonas: NEGATIVE

## 2016-12-24 LAB — URINE CYTOLOGY ANCILLARY ONLY
BACTERIAL VAGINITIS: NEGATIVE
Candida vaginitis: NEGATIVE

## 2016-12-26 ENCOUNTER — Ambulatory Visit (INDEPENDENT_AMBULATORY_CARE_PROVIDER_SITE_OTHER): Payer: 59 | Admitting: Physician Assistant

## 2016-12-26 ENCOUNTER — Ambulatory Visit (INDEPENDENT_AMBULATORY_CARE_PROVIDER_SITE_OTHER): Payer: 59 | Admitting: Psychology

## 2016-12-26 VITALS — BP 126/82 | HR 84 | Temp 98.1°F | Ht 66.0 in | Wt 233.0 lb

## 2016-12-26 DIAGNOSIS — E669 Obesity, unspecified: Secondary | ICD-10-CM

## 2016-12-26 DIAGNOSIS — Z6837 Body mass index (BMI) 37.0-37.9, adult: Secondary | ICD-10-CM

## 2016-12-26 DIAGNOSIS — F331 Major depressive disorder, recurrent, moderate: Secondary | ICD-10-CM | POA: Diagnosis not present

## 2016-12-26 DIAGNOSIS — R7303 Prediabetes: Secondary | ICD-10-CM | POA: Diagnosis not present

## 2016-12-26 NOTE — Progress Notes (Signed)
Office: [phone]  /  Fax: [phone]   HPI:   Chief Complaint: OBESITY Katherine Mcfarland is here to discuss her progress with her obesity treatment plan. She is on the  keep a food journal with 550-650 calories and 40+g protein  for supper and follow the category 3 plan and is following her eating plan approximately 75 % of the time. She states she is walking for 20 minutes 3 times per week. Katherine Mcfarland continues to do well with weight loss. She has been following the category plan, and has not started to keep a food journal yet. She has been eating more but makes smarter choices and controls her portions. Her weight is 233 lb (105.7 kg) today and has had a weight loss of 1 pounds over a period of 2 weeks since her last visit. She has lost 8 lbs since starting treatment with [place].  Pre-Diabetes Katherine Mcfarland has a diagnosis of prediabetes based on her elevated HgA1c and was informed this puts her at greater risk of developing diabetes. She is taking metformin currently and states diarrhea due to metformin has resolved. She continues to work on diet and exercise to decrease her risk of diabetes. She denies nausea or hypoglycemia.    ALLERGIES: Allergies  Allergen Reactions  . Metronidazole Nausea And Vomiting  . Mushroom Extract Complex Diarrhea    MEDICATIONS: Current Outpatient Prescriptions on File Prior to Visit  Medication Sig Dispense Refill  . fluconazole (DIFLUCAN) 150 MG tablet Take one by mouth weekly as needed. 12 tablet 2  . fluticasone (FLONASE) 50 MCG/ACT nasal spray Place 2 sprays into both nostrils daily. 16 g 6  . levocetirizine (XYZAL) 5 MG tablet Take 5 mg by mouth every evening. Take 10mg  prn    . metFORMIN (GLUCOPHAGE) 500 MG tablet Take 1 tablet (500 mg total) by mouth daily after breakfast. 30 tablet 0  . montelukast (SINGULAIR) 10 MG tablet Take 1 tablet by mouth daily.  5  . Norethindrone Acetate-Ethinyl Estrad-FE (LOESTRIN 24 FE) 1-20 MG-MCG(24) tablet Take 1 tablet by  mouth daily. 3 Package 11  . Prenatal Multivit-Min-Fe-FA (PRENATAL VITAMINS) 0.8 MG tablet Take 1 tablet by mouth daily. 30 tablet 12  . SUMAtriptan (IMITREX) 100 MG tablet Take one tablet at onset of headache. May repeat in 2 hours. No more than 2 pills in 24 hours.   Follow-up with me on Monday to see how this is working. 10 tablet 0  . Vitamin D, Ergocalciferol, (DRISDOL) 50000 units CAPS capsule Take 1 capsule (50,000 Units total) by mouth every 7 (seven) days. 4 capsule 0   No current facility-administered medications on file prior to visit.     PAST MEDICAL HISTORY: Past Medical History:  Diagnosis Date  . Allergy    pt. reports seasonal allergies  . Headache   . Lactose intolerance   . Palpitations   . Right knee pain   . Tinnitus     PAST SURGICAL HISTORY: Past Surgical History:  Procedure Laterality Date  . CESAREAN SECTION    . WISDOM TOOTH EXTRACTION      SOCIAL HISTORY: Social History  Substance Use Topics  . Smoking status: Never Smoker  . Smokeless tobacco: Never Used  . Alcohol use Yes     Comment: social    FAMILY HISTORY: Family History  Problem Relation Age of Onset  . Diabetes Maternal Grandmother   . Hypertension Maternal Grandmother   . Hypertension Mother   . Hyperlipidemia Mother   . Hypertension Father  ROS: Review of Systems  Constitutional: Positive for weight loss.  Endo/Heme/Allergies:       Negative polyphagia    PHYSICAL EXAM: Blood pressure 126/82, pulse 84, temperature 98.1 F (36.7 C), temperature source Oral, height 5\' 6"  (1.676 m), weight 233 lb (105.7 kg), SpO2 99 %. Body mass index is 37.61 kg/m. Physical Exam  Constitutional: She is oriented to person, place, and time. She appears well-developed and well-nourished.  Cardiovascular: Normal rate.   Pulmonary/Chest: Effort normal.  Musculoskeletal: Normal range of motion.  Neurological: She is alert and oriented to person, place, and time.  Skin: Skin is warm and  dry.  Psychiatric: She has a normal mood and affect.    RECENT LABS AND TESTS: BMET    Component Value Date/Time   NA 140 09/26/2016 0946   K 4.1 09/26/2016 0946   CL 101 09/26/2016 0946   CO2 23 09/26/2016 0946   GLUCOSE 114 (H) 09/26/2016 0946   GLUCOSE 109 (H) 01/04/2016 0802   BUN 12 09/26/2016 0946   CREATININE 0.85 09/26/2016 0946   CREATININE 0.86 10/26/2014 1502   CALCIUM 9.4 09/26/2016 0946   GFRNONAA 90 09/26/2016 0946   GFRNONAA >89 10/26/2014 1502   GFRAA 104 09/26/2016 0946   GFRAA >89 10/26/2014 1502   Lab Results  Component Value Date   HGBA1C 6.1 (H) 09/26/2016   Lab Results  Component Value Date   INSULIN 23.3 09/26/2016   CBC    Component Value Date/Time   WBC WILL FOLLOW 11/01/2016 1216   WBC 5.9 01/04/2016 0802   RBC WILL FOLLOW 11/01/2016 1216   RBC 4.63 01/04/2016 0802   HGB WILL FOLLOW 11/01/2016 1216   HCT WILL FOLLOW 11/01/2016 1216   PLT WILL FOLLOW 11/01/2016 1216   MCV WILL FOLLOW 11/01/2016 1216   MCH WILL FOLLOW 11/01/2016 1216   MCH 27.0 10/26/2014 1502   MCHC WILL FOLLOW 11/01/2016 1216   MCHC 33.2 01/04/2016 0802   RDW WILL FOLLOW 11/01/2016 1216   LYMPHSABS WILL FOLLOW 11/01/2016 1216   MONOABS 0.4 01/04/2016 0802   EOSABS WILL FOLLOW 11/01/2016 1216   BASOSABS WILL FOLLOW 11/01/2016 1216   Iron/TIBC/Ferritin/ %Sat No results found for: IRON, TIBC, FERRITIN, IRONPCTSAT Lipid Panel     Component Value Date/Time   CHOL 171 09/26/2016 0946   TRIG 96 09/26/2016 0946   HDL 45 09/26/2016 0946   CHOLHDL 4 01/04/2016 0802   VLDL 18.2 01/04/2016 0802   LDLCALC 107 (H) 09/26/2016 0946   Hepatic Function Panel     Component Value Date/Time   PROT 7.2 09/26/2016 0946   ALBUMIN 4.0 09/26/2016 0946   AST 20 09/26/2016 0946   ALT 19 09/26/2016 0946   ALKPHOS 73 09/26/2016 0946   BILITOT 0.3 09/26/2016 0946      Component Value Date/Time   TSH 1.150 09/26/2016 0946   TSH 1.10 02/09/2016 1127   TSH 1.73 01/04/2016 0802     ASSESSMENT AND PLAN: Prediabetes  Class 2 obesity without serious comorbidity with body mass index (BMI) of 37.0 to 37.9 in adult, unspecified obesity type  PLAN:  Pre-Diabetes Katherine Mcfarland will continue to work on weight loss, exercise, and decreasing simple carbohydrates in her diet to help decrease the risk of diabetes. We dicussed metformin including benefits and risks. She was informed that eating too many simple carbohydrates or too many calories at one sitting increases the likelihood of GI side effects. Katherine Mcfarland requested metformin for now and a prescription was not written today. Katherine Mcfarland agreed  to follow up with [place] as directed to monitor her progress.  We spent > than 50% of the 15 minute visit on the counseling as documented in the note.   Obesity Katherine Mcfarland is currently in the action stage of change. As such, her goal is to continue with weight loss efforts She has agreed to keep a food journal with 550-650 calories and 40+g protein at supper and follow the category 3 plan. Katherine Mcfarland has been instructed to work up to a goal of 150 minutes of combined cardio and strengthening exercise per week for weight loss and overall health benefits. We discussed the following Behavioral Modification Stratagies today: increasing lean protein intake and decrease eating out.   Katherine Mcfarland has agreed to follow up with our clinic in 2 weeks. She was informed of the importance of frequent follow up visits to maximize her success with intensive lifestyle modifications for her multiple health conditions.   Office: [phone]  /  Fax: [phone]  OBESITY BEHAVIORAL INTERVENTION VISIT  Today's visit was # 7 out of 22.  Starting weight: 241 Starting date: 09/26/16 Today's weight : Weight: 233 lb (105.7 kg)  Today's date: 12/26/2016 Total lbs lost to date: 8 (Patients must lose 7 lbs in the first 6 months to continue with counseling)   ASK: We discussed the diagnosis of obesity with Katherine Mcfarland today and Katherine Mcfarland agreed to give [place] permission to discuss obesity behavioral modification therapy today.  ASSESS: Katherine Mcfarland has the diagnosis of obesity and her BMI today is 37.7 Katherine Mcfarland is in the action stage of change    ADVISE: Katherine Mcfarland was educated on the multiple health risks of obesity as well as the benefit of weight loss to improve her health. She was advised of the need for long term treatment and the importance of lifestyle modifications.  AGREE: Multiple dietary modification options and treatment options were discussed and  Katherine Mcfarland agreed to keep a food journal with 550-650 calories and 40+g protein for supper and follow the category 3 plan. We discussed the following Behavioral Modification Stratagies today: increasing lean protein intake and decrease eating out    I have reviewed the above documentation for accuracy and completeness, and I agree with the above. -Lacy Duverney, PA-C  I have reviewed the above note and agree with the plan. -[employer],

## 2016-12-31 ENCOUNTER — Encounter (INDEPENDENT_AMBULATORY_CARE_PROVIDER_SITE_OTHER): Payer: Self-pay | Admitting: Family Medicine

## 2016-12-31 NOTE — Telephone Encounter (Signed)
se

## 2017-01-09 ENCOUNTER — Ambulatory Visit (INDEPENDENT_AMBULATORY_CARE_PROVIDER_SITE_OTHER): Payer: 59 | Admitting: Psychology

## 2017-01-09 ENCOUNTER — Ambulatory Visit (INDEPENDENT_AMBULATORY_CARE_PROVIDER_SITE_OTHER): Payer: 59 | Admitting: Physician Assistant

## 2017-01-09 ENCOUNTER — Other Ambulatory Visit: Payer: 59

## 2017-01-09 VITALS — BP 125/78 | HR 82 | Temp 98.5°F | Ht 66.0 in | Wt 233.0 lb

## 2017-01-09 DIAGNOSIS — F4323 Adjustment disorder with mixed anxiety and depressed mood: Secondary | ICD-10-CM | POA: Diagnosis not present

## 2017-01-09 DIAGNOSIS — Z9189 Other specified personal risk factors, not elsewhere classified: Secondary | ICD-10-CM | POA: Diagnosis not present

## 2017-01-09 DIAGNOSIS — R7303 Prediabetes: Secondary | ICD-10-CM | POA: Diagnosis not present

## 2017-01-09 DIAGNOSIS — E669 Obesity, unspecified: Secondary | ICD-10-CM

## 2017-01-09 DIAGNOSIS — E559 Vitamin D deficiency, unspecified: Secondary | ICD-10-CM

## 2017-01-09 DIAGNOSIS — Z6837 Body mass index (BMI) 37.0-37.9, adult: Secondary | ICD-10-CM | POA: Diagnosis not present

## 2017-01-09 DIAGNOSIS — E784 Other hyperlipidemia: Secondary | ICD-10-CM | POA: Diagnosis not present

## 2017-01-09 DIAGNOSIS — E7849 Other hyperlipidemia: Secondary | ICD-10-CM | POA: Insufficient documentation

## 2017-01-09 MED ORDER — VITAMIN D (ERGOCALCIFEROL) 1.25 MG (50000 UNIT) PO CAPS: 50000.0000 [IU] | ORAL_CAPSULE | ORAL | 0 refills | Status: DC

## 2017-01-09 NOTE — Progress Notes (Signed)
Office: [phone]  /  Fax: [phone]   HPI:   Chief Complaint: OBESITY Katherine Mcfarland is here to discuss her progress with her obesity treatment plan. She is on the  follow the Category 3 plan and is following her eating plan approximately 60 % of the time. She states she is exercising 0 minutes 0 times per week. Katherine Mcfarland is retaining fluids constantly and she has not kept up with her water intake but her hunger is well controlled.  Her weight is 233 lb (105.7 kg) today and has gained 1 pound since her last visit. She has lost 8 lbs since starting treatment with [place].  Vitamin D deficiency Katherine Mcfarland has a diagnosis of vitamin D deficiency. She is currently taking vit D and denies nausea, vomiting or muscle weakness.  Pre-Diabetes Katherine Mcfarland has a diagnosis of pre-diabetes based on her elevated Hgb A1c and was informed this puts her at greater risk of developing diabetes. She is taking metformin currently and continues to work on diet and exercise to decrease risk of diabetes. She denies nausea, hypoglycemia or polyphagia.  Hyperlipidemia Katherine Mcfarland has hyperlipidemia and is not currently taking a statin. She has been trying to improve her cholesterol levels with intensive lifestyle modification including a low saturated fat diet, exercise and weight loss. She denies any chest pain, claudication or myalgias.  At risk for diabetes Katherine Mcfarland is at higher than average risk for developing diabetes due to her obesity and pre-diabetes. She currently denies polyuria or polydipsia.  ALLERGIES: Allergies  Allergen Reactions  . Metronidazole Nausea And Vomiting  . Mushroom Extract Complex Diarrhea    MEDICATIONS: Current Outpatient Prescriptions on File Prior to Visit  Medication Sig Dispense Refill  . fluconazole (DIFLUCAN) 150 MG tablet Take one by mouth weekly as needed. 12 tablet 2  . fluticasone (FLONASE) 50 MCG/ACT nasal spray Place 2 sprays into both nostrils daily. 16 g 6  . levocetirizine  (XYZAL) 5 MG tablet Take 5 mg by mouth every evening. Take 10mg  prn    . metFORMIN (GLUCOPHAGE) 500 MG tablet Take 1 tablet (500 mg total) by mouth daily after breakfast. 30 tablet 0  . montelukast (SINGULAIR) 10 MG tablet Take 1 tablet by mouth daily.  5  . Prenatal Multivit-Min-Fe-FA (PRENATAL VITAMINS) 0.8 MG tablet Take 1 tablet by mouth daily. 30 tablet 12  . SUMAtriptan (IMITREX) 100 MG tablet Take one tablet at onset of headache. May repeat in 2 hours. No more than 2 pills in 24 hours.   Follow-up with me on Monday to see how this is working. 10 tablet 0   No current facility-administered medications on file prior to visit.     PAST MEDICAL HISTORY: Past Medical History:  Diagnosis Date  . Allergy    pt. reports seasonal allergies  . Headache   . Lactose intolerance   . Palpitations   . Right knee pain   . Tinnitus     PAST SURGICAL HISTORY: Past Surgical History:  Procedure Laterality Date  . CESAREAN SECTION    . WISDOM TOOTH EXTRACTION      SOCIAL HISTORY: Social History  Substance Use Topics  . Smoking status: Never Smoker  . Smokeless tobacco: Never Used  . Alcohol use Yes     Comment: social    FAMILY HISTORY: Family History  Problem Relation Age of Onset  . Diabetes Maternal Grandmother   . Hypertension Maternal Grandmother   . Hypertension Mother   . Hyperlipidemia Mother   . Hypertension Father  ROS: Review of Systems  Constitutional: Negative for weight loss.  Cardiovascular: Negative for chest pain and claudication.  Gastrointestinal: Negative for nausea and vomiting.  Genitourinary: Negative for frequency.       Negative polyuria  Musculoskeletal: Negative for myalgias.       Negative muscle weakness  Endo/Heme/Allergies: Negative for polydipsia.       Negative polyphagia Negative hypoglycemia    PHYSICAL EXAM: Blood pressure 125/78, pulse 82, temperature 98.5 F (36.9 C), temperature source Oral, height 5\' 6"  (1.676 m), weight 233  lb (105.7 kg), last menstrual period 01/02/2017, SpO2 99 %. Body mass index is 37.61 kg/m. Physical Exam  Constitutional: She is oriented to person, place, and time. She appears well-developed and well-nourished.  Cardiovascular: Normal rate.   Pulmonary/Chest: Effort normal.  Musculoskeletal: Normal range of motion.  Neurological: She is oriented to person, place, and time.  Skin: Skin is warm and dry.  Psychiatric: She has a normal mood and affect. Her behavior is normal.  Vitals reviewed.   RECENT LABS AND TESTS: BMET    Component Value Date/Time   NA 140 09/26/2016 0946   K 4.1 09/26/2016 0946   CL 101 09/26/2016 0946   CO2 23 09/26/2016 0946   GLUCOSE 114 (H) 09/26/2016 0946   GLUCOSE 109 (H) 01/04/2016 0802   BUN 12 09/26/2016 0946   CREATININE 0.85 09/26/2016 0946   CREATININE 0.86 10/26/2014 1502   CALCIUM 9.4 09/26/2016 0946   GFRNONAA 90 09/26/2016 0946   GFRNONAA >89 10/26/2014 1502   GFRAA 104 09/26/2016 0946   GFRAA >89 10/26/2014 1502   Lab Results  Component Value Date   HGBA1C 6.1 (H) 09/26/2016   Lab Results  Component Value Date   INSULIN 23.3 09/26/2016   CBC    Component Value Date/Time   WBC WILL FOLLOW 11/01/2016 1216   WBC 5.9 01/04/2016 0802   RBC WILL FOLLOW 11/01/2016 1216   RBC 4.63 01/04/2016 0802   HGB WILL FOLLOW 11/01/2016 1216   HCT WILL FOLLOW 11/01/2016 1216   PLT WILL FOLLOW 11/01/2016 1216   MCV WILL FOLLOW 11/01/2016 1216   MCH WILL FOLLOW 11/01/2016 1216   MCH 27.0 10/26/2014 1502   MCHC WILL FOLLOW 11/01/2016 1216   MCHC 33.2 01/04/2016 0802   RDW WILL FOLLOW 11/01/2016 1216   LYMPHSABS WILL FOLLOW 11/01/2016 1216   MONOABS 0.4 01/04/2016 0802   EOSABS WILL FOLLOW 11/01/2016 1216   BASOSABS WILL FOLLOW 11/01/2016 1216   Iron/TIBC/Ferritin/ %Sat No results found for: IRON, TIBC, FERRITIN, IRONPCTSAT Lipid Panel     Component Value Date/Time   CHOL 171 09/26/2016 0946   TRIG 96 09/26/2016 0946   HDL 45  09/26/2016 0946   CHOLHDL 4 01/04/2016 0802   VLDL 18.2 01/04/2016 0802   LDLCALC 107 (H) 09/26/2016 0946   Hepatic Function Panel     Component Value Date/Time   PROT 7.2 09/26/2016 0946   ALBUMIN 4.0 09/26/2016 0946   AST 20 09/26/2016 0946   ALT 19 09/26/2016 0946   ALKPHOS 73 09/26/2016 0946   BILITOT 0.3 09/26/2016 0946      Component Value Date/Time   TSH 1.150 09/26/2016 0946   TSH 1.10 02/09/2016 1127   TSH 1.73 01/04/2016 0802    ASSESSMENT AND PLAN: Vitamin D deficiency - Plan: VITAMIN D 25 Hydroxy (Vit-D Deficiency, Fractures), Vitamin D, Ergocalciferol, (DRISDOL) 50000 units CAPS capsule  Prediabetes - Plan: Comprehensive metabolic panel, Hemoglobin A1c, Insulin, random  Other hyperlipidemia - Plan: Lipid Panel  With LDL/HDL Ratio  At risk for diabetes mellitus  Class 2 obesity without serious comorbidity with body mass index (BMI) of 37.0 to 37.9 in adult, unspecified obesity type  PLAN:  Vitamin D Deficiency Curtis was informed that low vitamin D levels contributes to fatigue and are associated with obesity, breast, and colon cancer. She agrees to continue to take prescription Vit D @50 ,000 IU every week and will follow up for routine testing of vitamin D, at least 2-3 times per year.  She was informed of the risk of over-replacement of vitamin D and agrees to not increase her dose unless he discusses this with [place] first. We will refill Vit D 50,000 IU every week #4 with no refills and we will recheck labs. Katherine Mcfarland agrees to follow up with our clinic in 2 to 3 weeks.  Pre-Diabetes Belen will continue to work on weight loss, exercise, and decreasing simple carbohydrates in her diet to help decrease the risk of diabetes. We dicussed metformin including benefits and risks. She was informed that eating too many simple carbohydrates or too many calories at one sitting increases the likelihood of GI side effects. Katherine Mcfarland agrees to continue metformin and we will  recheck labs. She agrees to follow up with [place] as directed to monitor her progress.  Hyperlipidemia Katherine Mcfarland was informed of the American Heart Association Guidelines emphasizing intensive lifestyle modifications as the first line treatment for hyperlipidemia. We discussed many lifestyle modifications today in depth, and Katherine Mcfarland will continue to work on decreasing saturated fats such as fatty red meat, butter and many fried foods. She will also increase vegetables and lean protein in her diet and continue to work on exercise and weight loss efforts. We will recheck labs.  Diabetes risk counselling Katherine Mcfarland was given extended (15 minutes) diabetes prevention counseling today. She is 31 y.o. female and has risk factors for diabetes including obesity and pre-diabetes. We discussed intensive lifestyle modifications today with an emphasis on weight loss as well as increasing exercise and decreasing simple carbohydrates in her diet.  Obesity Katherine Mcfarland is currently in the action stage of change. As such, her goal is to continue with weight loss efforts She has agreed to follow the Category 3 plan Katherine Mcfarland has been instructed to work up to a goal of 150 minutes of combined cardio and strengthening exercise per week for weight loss and overall health benefits. We discussed the following Behavioral Modification Strategies today: increasing lean protein intake and increase H20 intake   Katherine Mcfarland has agreed to follow up with our clinic in 2 to 3 weeks. She was informed of the importance of frequent follow up visits to maximize her success with intensive lifestyle modifications for her multiple health conditions.  I, Trixie Dredge, am acting as transcriptionist for Lacy Duverney, PA-C  I have reviewed the above documentation for accuracy and completeness, and I agree with the above. -Lacy Duverney, PA-C  I have reviewed the above note and agree with the plan. -Dennard Nip, MD   OBESITY BEHAVIORAL  INTERVENTION VISIT  Today's visit was # 8 out of 22.  Starting weight: 241 lbs Starting date: 09/26/16 Today's weight : 233 lbs  Today's date: 01/09/17 Total lbs lost to date: 8 (Patients must lose 7 lbs in the first 6 months to continue with counseling)   ASK: We discussed the diagnosis of obesity with Katherine Mcfarland today and Katherine Mcfarland agreed to give [place] permission to discuss obesity behavioral modification therapy today.  ASSESS: Mazy has the diagnosis of obesity  and her BMI today is 26 Katherine Mcfarland is in the action stage of change   ADVISE: Katherine Mcfarland was educated on the multiple health risks of obesity as well as the benefit of weight loss to improve her health. She was advised of the need for long term treatment and the importance of lifestyle modifications.  AGREE: Multiple dietary modification options and treatment options were discussed and  Katherine Mcfarland agreed to follow the Category 3 plan We discussed the following Behavioral Modification Strategies today: increasing lean protein intake and increase H20 intake

## 2017-01-11 ENCOUNTER — Ambulatory Visit: Payer: Self-pay | Admitting: Family Medicine

## 2017-01-25 ENCOUNTER — Encounter: Payer: Self-pay | Admitting: Family Medicine

## 2017-01-25 ENCOUNTER — Encounter (INDEPENDENT_AMBULATORY_CARE_PROVIDER_SITE_OTHER): Payer: Self-pay | Admitting: Physician Assistant

## 2017-01-25 ENCOUNTER — Ambulatory Visit (INDEPENDENT_AMBULATORY_CARE_PROVIDER_SITE_OTHER): Payer: 59 | Admitting: Family Medicine

## 2017-01-25 VITALS — BP 108/68 | HR 74 | Temp 98.3°F | Ht 67.0 in | Wt 237.0 lb

## 2017-01-25 DIAGNOSIS — B9789 Other viral agents as the cause of diseases classified elsewhere: Secondary | ICD-10-CM

## 2017-01-25 DIAGNOSIS — J069 Acute upper respiratory infection, unspecified: Secondary | ICD-10-CM

## 2017-01-25 DIAGNOSIS — J302 Other seasonal allergic rhinitis: Secondary | ICD-10-CM | POA: Diagnosis not present

## 2017-01-25 LAB — POCT RAPID STREP A (OFFICE): Rapid Strep A Screen: NEGATIVE

## 2017-01-25 MED ORDER — FLUTICASONE PROPIONATE 50 MCG/ACT NA SUSP: 2.0000 | Freq: Every day | NASAL | 6 refills | Status: DC

## 2017-01-25 MED FILL — FLUTICASONE PROP 50 MCG SPR: 50 | 30 days supply | Qty: 16 | Fill #0

## 2017-01-25 NOTE — Progress Notes (Signed)
Chief Complaint  Patient presents with  . Ear Pain  . Sore Throat    Katherine Mcfarland Cipro here for URI complaints.  Duration: 2 days  Associated symptoms: ear pain, congestion, sore throat and myalgia Denies: sinus congestion, sinus pain, rhinorrhea, itchy watery eyes, ear drainage, shortness of breath and fevers/rigors Treatment to date: sleep Sick contacts: No  ROS:  Const: Denies fevers HEENT: As noted in HPI Lungs: No SOB  Past Medical History:  Diagnosis Date  . Allergy    pt. reports seasonal allergies  . Headache   . Lactose intolerance   . Palpitations   . Right knee pain   . Tinnitus    Family History  Problem Relation Age of Onset  . Diabetes Maternal Grandmother   . Hypertension Maternal Grandmother   . Hypertension Mother   . Hyperlipidemia Mother   . Hypertension Father     BP 108/68 (BP Location: Left Arm, Patient Position: Sitting, Cuff Size: Large)   Pulse 74   Temp 98.3 F (36.8 C) (Oral)   Ht 5\' 7"  (1.702 m)   Wt 237 lb (107.5 kg)   LMP 01/02/2017 (Exact Date)   SpO2 99%   BMI 37.12 kg/m  General: Awake, alert, appears stated age 31: AT, [place], ears patent b/l and TM's neg, nares patent w/o discharge, turbinates are enlarged b/l, pharynx pink and without exudates, MMM Neck: No masses or asymmetry Heart: RRR, no murmurs, no bruits Lungs: CTAB, no accessory muscle use Psych: Age appropriate judgment and insight, normal mood and affect  Viral URI with cough  Seasonal allergic rhinitis, unspecified trigger - Plan: fluticasone (FLONASE) 50 MCG/ACT nasal spray   Orders as above. Continue to push fluids, practice good hand hygiene, cover mouth when coughing. F/u prn. If starting to experience fevers, shaking, or shortness of breath, seek immediate care. Pt voiced understanding and agreement to the plan.  [place], DO 01/25/17 11:28 AM

## 2017-01-25 NOTE — Patient Instructions (Signed)
Continue to push fluids, practice good hand hygiene, and cover your mouth if you cough.  If you start having fevers, shaking or shortness of breath, seek immediate care.  Ibuprofen 400-600 mg (2-3 over the counter strength tabs) every 6 hours as needed for pain.  OK to take Tylenol 1000 mg (2 extra strength tabs) or 975 mg (3 regular strength tabs) every 6 hours as needed.  Start using your nasal spray and xyzal again.   Let us know if you need anything.

## 2017-01-25 NOTE — Progress Notes (Signed)
Pre visit review using our clinic review tool, if applicable. No additional management support is needed unless otherwise documented below in the visit note. 

## 2017-01-28 NOTE — Telephone Encounter (Signed)
Can you take care of this?

## 2017-01-28 NOTE — Telephone Encounter (Signed)
Pt has been r/s'd for 02/13/2017.

## 2017-01-30 ENCOUNTER — Ambulatory Visit (INDEPENDENT_AMBULATORY_CARE_PROVIDER_SITE_OTHER): Payer: 59 | Admitting: Family Medicine

## 2017-01-30 ENCOUNTER — Encounter (INDEPENDENT_AMBULATORY_CARE_PROVIDER_SITE_OTHER): Payer: Self-pay

## 2017-02-04 ENCOUNTER — Other Ambulatory Visit (INDEPENDENT_AMBULATORY_CARE_PROVIDER_SITE_OTHER): Payer: Self-pay | Admitting: Physician Assistant

## 2017-02-04 DIAGNOSIS — E559 Vitamin D deficiency, unspecified: Secondary | ICD-10-CM

## 2017-02-08 ENCOUNTER — Other Ambulatory Visit (INDEPENDENT_AMBULATORY_CARE_PROVIDER_SITE_OTHER): Payer: 59

## 2017-02-08 ENCOUNTER — Telehealth: Payer: Self-pay

## 2017-02-08 DIAGNOSIS — N926 Irregular menstruation, unspecified: Secondary | ICD-10-CM

## 2017-02-08 DIAGNOSIS — R42 Dizziness and giddiness: Secondary | ICD-10-CM

## 2017-02-08 LAB — HCG, QUANTITATIVE, PREGNANCY: QUANTITATIVE HCG: 0.18 m[IU]/mL

## 2017-02-08 NOTE — Telephone Encounter (Signed)
Pt states she has been dizzy and irregular bleeding

## 2017-02-08 NOTE — Addendum Note (Signed)
Addended by: Hinton Dyer on: 02/08/2017 09:43 AM   Modules accepted: Orders

## 2017-02-13 ENCOUNTER — Ambulatory Visit (INDEPENDENT_AMBULATORY_CARE_PROVIDER_SITE_OTHER): Payer: 59 | Admitting: Family Medicine

## 2017-02-13 VITALS — BP 123/79 | HR 90 | Temp 98.3°F | Ht 66.0 in | Wt 235.0 lb

## 2017-02-13 DIAGNOSIS — F3289 Other specified depressive episodes: Secondary | ICD-10-CM

## 2017-02-13 DIAGNOSIS — R7303 Prediabetes: Secondary | ICD-10-CM

## 2017-02-13 DIAGNOSIS — Z9189 Other specified personal risk factors, not elsewhere classified: Secondary | ICD-10-CM | POA: Diagnosis not present

## 2017-02-13 DIAGNOSIS — E7849 Other hyperlipidemia: Secondary | ICD-10-CM

## 2017-02-13 DIAGNOSIS — E559 Vitamin D deficiency, unspecified: Secondary | ICD-10-CM | POA: Diagnosis not present

## 2017-02-13 DIAGNOSIS — Z6838 Body mass index (BMI) 38.0-38.9, adult: Secondary | ICD-10-CM | POA: Diagnosis not present

## 2017-02-13 MED ORDER — VITAMIN D (ERGOCALCIFEROL) 1.25 MG (50000 UNIT) PO CAPS: 50000.0000 [IU] | ORAL_CAPSULE | ORAL | 0 refills | Status: DC

## 2017-02-13 MED ORDER — BUPROPION HCL ER (SR) 150 MG PO TB12: 150.0000 mg | ORAL_TABLET | Freq: Every day | ORAL | 1 refills | Status: DC

## 2017-02-14 MED FILL — VIT D2 1.25 MG (50,000 UNIT: 1.25 MG | 28 days supply | Qty: 4 | Fill #0

## 2017-02-14 MED FILL — BUPROPION SR 150 MG TABLET: 150 | 30 days supply | Qty: 30 | Fill #0

## 2017-02-14 NOTE — Progress Notes (Signed)
Office: [phone]  /  Fax: [phone]   HPI:   Chief Complaint: OBESITY Katherine Mcfarland is here to discuss her progress with her obesity treatment plan. She is on the Category 3 plan and is following her eating plan approximately 0 % of the time. She states she is exercising 0 minutes 0 times per week. Katherine Mcfarland's daughter has been sick and hospitalized for 2 weeks with nephrotic syndrome. Katherine Mcfarland was stress eating and eating Mcfarland food. Family is back home and things are settling down. [name] is ready to get back on track. Her weight is 235 lb (106.6 kg) today and has had a weight gain of 2 pounds over a period of 5 weeks since her last visit. She has lost 6 lbs since starting treatment with [place].  Vitamin D deficiency Katherine Mcfarland has a diagnosis of vitamin D deficiency. She is currently stable on vit D and denies nausea, vomiting or muscle weakness.  Hyperlipidemia Katherine Mcfarland has hyperlipidemia, LDL is slightly elevated. She is attempting to control her cholesterol levels with intensive lifestyle modification including a low saturated fat diet, exercise and weight loss. She denies any chest pain, claudication or myalgias.  Pre-Diabetes Katherine Mcfarland has a diagnosis of prediabetes based on her elevated Hgb A1c and was informed this puts her at greater risk of developing diabetes. She is taking metformin currently and continues to work on diet and exercise to decrease risk of diabetes. She denies nausea or hypoglycemia.  At risk for diabetes Katherine Mcfarland is at higher than average risk for developing diabetes due to her obesity and pre-diabetes. She currently denies polyuria or polydipsia.  Depression with emotional eating behaviors Katherine Mcfarland notes increased emotional eating and using food for comfort with increased stress to the extent that it is negatively impacting her health. She often snacks when she is not hungry. Katherine Mcfarland sometimes feels she is out of control and then feels guilty that she made  poor food choices. She has been working on behavior modification techniques to help reduce her emotional eating and has been somewhat successful. She shows no sign of suicidal or homicidal ideations.  Depression screen Katherine Mcfarland 2/9 11/21/2016 09/26/2016  Decreased Interest 0 1  Down, Depressed, Hopeless 0 2  PHQ - 2 Score 0 3  Altered sleeping 0 1  Tired, decreased energy 1 1  Change in appetite 0 1  Feeling bad or failure about yourself  0 2  Trouble concentrating 0 0  Moving slowly or fidgety/restless 0 0  Suicidal thoughts 0 0  PHQ-9 Score 1 8     ALLERGIES: Allergies  Allergen Reactions  . Metronidazole Nausea And Vomiting  . Mushroom Extract Complex Diarrhea    MEDICATIONS: Current Outpatient Prescriptions on File Prior to Visit  Medication Sig Dispense Refill  . fluconazole (DIFLUCAN) 150 MG tablet Take one by mouth weekly as needed. 12 tablet 2  . fluticasone (FLONASE) 50 MCG/ACT nasal spray Place 2 sprays into both nostrils daily. 16 g 6  . levocetirizine (XYZAL) 5 MG tablet Take 5 mg by mouth every evening. Take 10mg  prn    . metFORMIN (GLUCOPHAGE) 500 MG tablet Take 1 tablet (500 mg total) by mouth daily after breakfast. 30 tablet 0  . montelukast (SINGULAIR) 10 MG tablet Take 1 tablet by mouth daily.  5  . Prenatal Multivit-Min-Fe-FA (PRENATAL VITAMINS) 0.8 MG tablet Take 1 tablet by mouth daily. 30 tablet 12  . SUMAtriptan (IMITREX) 100 MG tablet Take one tablet at onset of headache. May repeat in 2 hours. No more than  2 pills in 24 hours.   Follow-up with me on Monday to see how this is working. 10 tablet 0   No current facility-administered medications on file prior to visit.     PAST MEDICAL HISTORY: Past Medical History:  Diagnosis Date  . Allergy    pt. reports seasonal allergies  . Headache   . Lactose intolerance   . Palpitations   . Right knee pain   . Tinnitus     PAST SURGICAL HISTORY: Past Surgical History:  Procedure Laterality Date  . CESAREAN  SECTION    . WISDOM TOOTH EXTRACTION      SOCIAL HISTORY: Social History  Substance Use Topics  . Smoking status: Never Smoker  . Smokeless tobacco: Never Used  . Alcohol use Yes     Comment: social    FAMILY HISTORY: Family History  Problem Relation Age of Onset  . Diabetes Maternal Grandmother   . Hypertension Maternal Grandmother   . Hypertension Mother   . Hyperlipidemia Mother   . Hypertension Father     ROS: Review of Systems  Constitutional: Negative for weight loss.  Cardiovascular: Negative for chest pain and claudication.  Gastrointestinal: Negative for nausea and vomiting.  Genitourinary: Negative for frequency.  Musculoskeletal: Negative for myalgias.       Negative muscle weakness  Endo/Heme/Allergies: Negative for polydipsia.       Negative hypoglycemia  Psychiatric/Behavioral: Positive for depression. Negative for suicidal ideas.    PHYSICAL EXAM: Blood pressure 123/79, pulse 90, temperature 98.3 F (36.8 C), temperature source Oral, height 5\' 6"  (1.676 m), weight 235 lb (106.6 kg), SpO2 99 %. Body mass index is 37.93 kg/m. Physical Exam  Constitutional: She is oriented to person, place, and time. She appears well-developed and well-nourished.  Cardiovascular: Normal rate.   Pulmonary/Chest: Effort normal.  Musculoskeletal: Normal range of motion.  Neurological: She is oriented to person, place, and time.  Skin: Skin is warm and dry.  Psychiatric: She has a normal mood and affect. Her behavior is normal.  Vitals reviewed.   RECENT LABS AND TESTS: BMET    Component Value Date/Time   NA 140 09/26/2016 0946   K 4.1 09/26/2016 0946   CL 101 09/26/2016 0946   CO2 23 09/26/2016 0946   GLUCOSE 114 (H) 09/26/2016 0946   GLUCOSE 109 (H) 01/04/2016 0802   BUN 12 09/26/2016 0946   CREATININE 0.85 09/26/2016 0946   CREATININE 0.86 10/26/2014 1502   CALCIUM 9.4 09/26/2016 0946   GFRNONAA 90 09/26/2016 0946   GFRNONAA >89 10/26/2014 1502   GFRAA  104 09/26/2016 0946   GFRAA >89 10/26/2014 1502   Lab Results  Component Value Date   HGBA1C 6.1 (H) 09/26/2016   Lab Results  Component Value Date   INSULIN 23.3 09/26/2016   CBC    Component Value Date/Time   WBC WILL FOLLOW 11/01/2016 1216   WBC 5.9 01/04/2016 0802   RBC WILL FOLLOW 11/01/2016 1216   RBC 4.63 01/04/2016 0802   HGB WILL FOLLOW 11/01/2016 1216   HCT WILL FOLLOW 11/01/2016 1216   PLT WILL FOLLOW 11/01/2016 1216   MCV WILL FOLLOW 11/01/2016 1216   MCH WILL FOLLOW 11/01/2016 1216   MCH 27.0 10/26/2014 1502   MCHC WILL FOLLOW 11/01/2016 1216   MCHC 33.2 01/04/2016 0802   RDW WILL FOLLOW 11/01/2016 1216   LYMPHSABS WILL FOLLOW 11/01/2016 1216   MONOABS 0.4 01/04/2016 0802   EOSABS WILL FOLLOW 11/01/2016 1216   BASOSABS WILL FOLLOW 11/01/2016 1216  Iron/TIBC/Ferritin/ %Sat No results found for: IRON, TIBC, FERRITIN, IRONPCTSAT Lipid Panel     Component Value Date/Time   CHOL 171 09/26/2016 0946   TRIG 96 09/26/2016 0946   HDL 45 09/26/2016 0946   CHOLHDL 4 01/04/2016 0802   VLDL 18.2 01/04/2016 0802   LDLCALC 107 (H) 09/26/2016 0946   Hepatic Function Panel     Component Value Date/Time   PROT 7.2 09/26/2016 0946   ALBUMIN 4.0 09/26/2016 0946   AST 20 09/26/2016 0946   ALT 19 09/26/2016 0946   ALKPHOS 73 09/26/2016 0946   BILITOT 0.3 09/26/2016 0946      Component Value Date/Time   TSH 1.150 09/26/2016 0946   TSH 1.10 02/09/2016 1127   TSH 1.73 01/04/2016 0802    ASSESSMENT AND PLAN: Vitamin D deficiency - Plan: VITAMIN D 25 Hydroxy (Vit-D Deficiency, Fractures), Vitamin D, Ergocalciferol, (DRISDOL) 50000 units CAPS capsule  Other depression - Plan: buPROPion (WELLBUTRIN SR) 150 MG 12 hr tablet  Prediabetes - Plan: Comprehensive metabolic panel, Hemoglobin A1c, Insulin, random  Other hyperlipidemia - Plan: Lipid Panel With LDL/HDL Ratio  At risk for diabetes mellitus  Class 2 severe obesity with serious comorbidity and body mass  index (BMI) of 38.0 to 38.9 in adult, unspecified obesity type (HCC)  PLAN:  Vitamin D Deficiency Virlee was informed that low vitamin D levels contributes to fatigue and are associated with obesity, breast, and colon cancer. She agrees to continue to take prescription Vit D @50 ,000 IU every week, we will refill for 1 month. We will check labs and will follow up for routine testing of vitamin D, at least 2-3 times per year. She was informed of the risk of over-replacement of vitamin D and agrees to not increase her dose unless he discusses this with [place] first. Lizzet agrees to follow up with our clinic in 2 weeks.  Hyperlipidemia Braylinn was informed of the American Heart Association Guidelines emphasizing intensive lifestyle modifications as the first line treatment for hyperlipidemia. We discussed many lifestyle modifications today in depth, and Yaniah will continue to work on decreasing saturated fats such as fatty red meat, butter and many fried foods. She will also increase vegetables and lean protein in her diet and continue to work on exercise and weight loss efforts. We will check labs and Shawnta agreed to follow up as directed.  Pre-Diabetes Clorinda will continue to work on weight loss, exercise, and decreasing simple carbohydrates in her diet to help decrease the risk of diabetes. We dicussed metformin including benefits and risks. She was informed that eating too many simple carbohydrates or too many calories at one sitting increases the likelihood of GI side effects. Namiyah agrees to continue metformin for now and a prescription was not written today. We will check labs and Shantea agreed to follow up with [place] as directed to monitor her progress.  Diabetes risk counseling Arali was given extended (30 minutes) diabetes prevention counseling today. She is 31 y.o. female and has risk factors for diabetes including obesity and pre-diabetes. We discussed intensive lifestyle  modifications today with an emphasis on weight loss as well as increasing exercise and decreasing simple carbohydrates in her diet.  Depression with Emotional Eating Behaviors We discussed behavior modification techniques today to help Kushi deal with her emotional eating and depression. She has agreed to start to take Wellbutrin SR 150 mg qam #30 with 1 refill and will follow up as directed.  Obesity Katherine Mcfarland is currently in the action stage of change.  As such, her goal is to continue with weight loss efforts She has agreed to portion control better and make smarter food choices, such as increase vegetables and decrease simple carbohydrates  and follow the Category 3 plan Vedika has been instructed to work up to a goal of 150 minutes of combined cardio and strengthening exercise per week for weight loss and overall health benefits. We discussed the following Behavioral Modification Strategies today: no skipping meals, planning for success, increasing lean protein intake, decreasing simple carbohydrates  and work on meal planning and easy cooking plans  Elouise has agreed to follow up with our clinic in 2 weeks. She was informed of the importance of frequent follow up visits to maximize her success with intensive lifestyle modifications for her multiple health conditions.  I, Doreene Nest, am acting as transcriptionist for Dennard Nip, MD  I have reviewed the above documentation for accuracy and completeness, and I agree with the above. -Dennard Nip, MD    OBESITY BEHAVIORAL INTERVENTION VISIT  Today's visit was # 9 out of 22.  Starting weight: 241 lbs Starting date: 09/26/16 Today's weight : 235 lbs  Today's date: 02/13/2017 Total lbs lost to date: 6 (Patients must lose 7 lbs in the first 6 months to continue with counseling)   ASK: We discussed the diagnosis of obesity with [name] today and [facility] agreed to give [place] permission to discuss obesity behavioral  modification therapy today.  ASSESS: Patsye has the diagnosis of obesity and her BMI today is 37.95 Jillane is in the action stage of change   ADVISE: Deaysia was educated on the multiple health risks of obesity as well as the benefit of weight loss to improve her health. She was advised of the need for long term treatment and the importance of lifestyle modifications.  AGREE: Multiple dietary modification options and treatment options were discussed and  Wrenn agreed to follow the Category 3 plan We discussed the following Behavioral Modification Strategies today: no skipping meals, planning for success, increasing lean protein intake, decreasing simple carbohydrates  and work on meal planning and easy cooking plans

## 2017-02-27 ENCOUNTER — Encounter (INDEPENDENT_AMBULATORY_CARE_PROVIDER_SITE_OTHER): Payer: Self-pay | Admitting: Physician Assistant

## 2017-02-27 ENCOUNTER — Ambulatory Visit: Payer: Self-pay | Admitting: Psychology

## 2017-02-27 ENCOUNTER — Ambulatory Visit (INDEPENDENT_AMBULATORY_CARE_PROVIDER_SITE_OTHER): Payer: 59 | Admitting: Physician Assistant

## 2017-02-27 ENCOUNTER — Encounter (INDEPENDENT_AMBULATORY_CARE_PROVIDER_SITE_OTHER): Payer: Self-pay

## 2017-03-28 ENCOUNTER — Other Ambulatory Visit: Payer: 59

## 2017-03-28 ENCOUNTER — Other Ambulatory Visit: Payer: Self-pay | Admitting: Family Medicine

## 2017-03-28 DIAGNOSIS — N926 Irregular menstruation, unspecified: Secondary | ICD-10-CM | POA: Diagnosis not present

## 2017-03-28 LAB — HCG, TOTAL, QUANTITATIVE

## 2017-03-29 ENCOUNTER — Telehealth: Payer: Self-pay | Admitting: *Deleted

## 2017-03-29 NOTE — Telephone Encounter (Signed)
Received results from [place]; forwarded to provider/SLS 11/16

## 2017-04-17 ENCOUNTER — Ambulatory Visit: Payer: Self-pay | Admitting: Obstetrics & Gynecology

## 2017-04-24 ENCOUNTER — Ambulatory Visit (INDEPENDENT_AMBULATORY_CARE_PROVIDER_SITE_OTHER): Payer: 59 | Admitting: Obstetrics & Gynecology

## 2017-04-24 ENCOUNTER — Encounter: Payer: Self-pay | Admitting: Obstetrics & Gynecology

## 2017-04-24 VITALS — BP 135/88 | HR 86 | Wt 244.0 lb

## 2017-04-24 DIAGNOSIS — N945 Secondary dysmenorrhea: Secondary | ICD-10-CM | POA: Diagnosis not present

## 2017-04-24 MED ORDER — DICLOFENAC POTASSIUM 50 MG PO TABS: 50.0000 mg | ORAL_TABLET | Freq: Three times a day (TID) | ORAL | 1 refills | Status: DC | PRN

## 2017-04-24 MED FILL — DICLOFENAC POT 50 MG TABLET: 50 | 10 days supply | Qty: 30 | Fill #0

## 2017-04-24 NOTE — Progress Notes (Signed)
Subjective:     Katherine Mcfarland is a 30 y.o. female here for a routine exam.  Cycle lengths 5-7. Cycle was late x2. It was around 3-5 days late.  Current complaints: pain cont on her left side.  She reports that it is worse on her cycles. Pt on no contraception. Pt is 'not' trying to conceive.  Has been sexually active for 1 year without conception. She is not actively trying to get pregnancy but, want to maintain her fertility. She takes Tylenol and has some relief.     The pain with cycles is relatively new. She is just now linking the 2.   Gynecologic History Patient's last menstrual period was 04/13/2017. Contraception: none Last Pap: 09/2016. Results were: normal Last mammogram: n/a  Obstetric History OB History  Gravida Para Term Preterm AB Living  4 4 4     4   SAB TAB Ectopic Multiple Live Births               # Outcome Date GA Lbr Len/2nd Weight Sex Delivery Anes PTL Lv  4 Term           3 Term           2 Term           1 Term                The following portions of the patient's history were reviewed and updated as appropriate: allergies, current medications, past family history, past medical history, past social history, past surgical history and problem list.  Review of Systems Pertinent items are noted in HPI.    Objective:  BP 135/88   Pulse 86   Wt 244 lb (110.7 kg)   LMP 04/13/2017   BMI 39.38 kg/m   CONSTITUTIONAL: Well-developed, well-nourished female in no acute distress.  HENT:  Normocephalic, atraumatic EYES: Conjunctivae and EOM are normal. No scleral icterus.  NECK: Normal range of motion SKIN: Skin is warm and dry. No rash noted. Not diaphoretic.No pallor. Katherine Mcfarland: Alert and oriented to person, place, and time. Normal coordination.  Abd: soft, NT, ND GU: EGBUS: no lesions Vagina: no blood in vault Cervix: no lesion; no mucopurulent d/c; no CMT Uterus: small, mobile; nontender  Adnexa: no masses; non tender    07/23/2016 CLINICAL  DATA:  Left adnexal pain for the past month, increased in intensity for the past week.  EXAM: TRANSABDOMINAL AND TRANSVAGINAL ULTRASOUND OF PELVIS  TECHNIQUE: Both transabdominal and transvaginal ultrasound examinations of the pelvis were performed. Transabdominal technique was performed for global imaging of the pelvis including uterus, ovaries, adnexal regions, and pelvic cul-de-sac. It was necessary to proceed with endovaginal exam following the transabdominal exam to visualize the uterus and ovaries in better detail.  COMPARISON:  None  FINDINGS: Uterus  Measurements: 8.5 x 5.0 x 4.4 cm. Two small oval myometrial masses, one measuring 1.9 cm in maximum diameter and the other measuring 1.2 cm in maximum diameter  Endometrium  Thickness: 10.2 mm.  No focal abnormality visualized.  Right ovary  Measurements: 3.2 x 1.3 x 2.0 cm. Normal appearance/no adnexal mass.  Left ovary  Measurements: 3.3 x 2.3 x 2.6 cm. Normal appearance/no adnexal mass.  Other findings  No abnormal free fluid.  IMPRESSION: Two small uterine fibroids. Neither has a submucosal component. Otherwise, unremarkable examination. Assessment:   Dysmenorrhea- suspect secondary.  Will try NSAIDS first.      Plan:    Follow up in: 3 months.  Cataflam 50mg  tid prn pain  Total face-to-face time with patient was 15 min.  Greater than 50% was spent in counseling and coordination of care with the patient.     [name] L. Harraway-Smith, M.D., Cherlynn June

## 2017-04-24 NOTE — Patient Instructions (Signed)
Dysmenorrhea °Menstrual cramps (dysmenorrhea) are caused by the muscles of the uterus tightening (contracting) during a menstrual period. For some women, this discomfort is merely bothersome. For others, dysmenorrhea can be severe enough to interfere with everyday activities for a few days each month. °Primary dysmenorrhea is menstrual cramps that last a couple of days when you start having menstrual periods or soon after. This often begins after a teenager starts having her period. As a woman gets older or has a baby, the cramps will usually lessen or disappear. Secondary dysmenorrhea begins later in life, lasts longer, and the pain may be stronger than primary dysmenorrhea. The pain may start before the period and last a few days after the period. °What are the causes? °Dysmenorrhea is usually caused by an underlying problem, such as: °· The tissue lining the uterus grows outside of the uterus in other areas of the body (endometriosis). °· The endometrial tissue, which normally lines the uterus, is found in or grows into the muscular walls of the uterus (adenomyosis). °· The pelvic blood vessels are engorged with blood just before the menstrual period (pelvic congestive syndrome). °· Overgrowth of cells (polyps) in the lining of the uterus or cervix. °· Falling down of the uterus (prolapse) because of loose or stretched ligaments. °· Depression. °· Bladder problems, infection, or inflammation. °· Problems with the intestine, a tumor, or irritable bowel syndrome. °· Cancer of the female organs or bladder. °· A severely tipped uterus. °· A very tight opening or closed cervix. °· Noncancerous tumors of the uterus (fibroids). °· Pelvic inflammatory disease (PID). °· Pelvic scarring (adhesions) from a previous surgery. °· Ovarian cyst. °· An intrauterine device (IUD) used for birth control. °What increases the risk? °You may be at greater risk of dysmenorrhea if: °· You are younger than age 30. °· You started puberty  early. °· You have irregular or heavy bleeding. °· You have never given birth. °· You have a family history of this problem. °· You are a smoker. °What are the signs or symptoms? °· Cramping or throbbing pain in your lower abdomen. °· Headaches. °· Lower back pain. °· Nausea or vomiting. °· Diarrhea. °· Sweating or dizziness. °· Loose stools. °How is this diagnosed? °A diagnosis is based on your history, symptoms, physical exam, diagnostic tests, or procedures. Diagnostic tests or procedures may include: °· Blood tests. °· Ultrasonography. °· An examination of the lining of the uterus (dilation and curettage, D&C). °· An examination inside your abdomen or pelvis with a scope (laparoscopy). °· X-rays. °· CT scan. °· MRI. °· An examination inside the bladder with a scope (cystoscopy). °· An examination inside the intestine or stomach with a scope (colonoscopy, gastroscopy). °How is this treated? °Treatment depends on the cause of the dysmenorrhea. Treatment may include: °· Pain medicine prescribed by your health care provider. °· Birth control pills or an IUD with progesterone hormone in it. °· Hormone replacement therapy. °· Nonsteroidal anti-inflammatory drugs (NSAIDs). These may help stop the production of prostaglandins. °· Surgery to remove adhesions, endometriosis, ovarian cyst, or fibroids. °· Removal of the uterus (hysterectomy). °· Progesterone shots to stop the menstrual period. °· Cutting the nerves on the sacrum that go to the female organs (presacral neurectomy). °· Electric current to the sacral nerves (sacral nerve stimulation). °· Antidepressant medicine. °· Psychiatric therapy, counseling, or group therapy. °· Exercise and physical therapy. °· Meditation and yoga therapy. °· Acupuncture. °Follow these instructions at home: °· Only take over-the-counter or prescription medicines as directed   by your health care provider. °· Place a heating pad or hot water bottle on your lower back or abdomen. Do not  sleep with the heating pad. °· Use aerobic exercises, walking, swimming, biking, and other exercises to help lessen the cramping. °· Massage to the lower back or abdomen may help. °· Stop smoking. °· Avoid alcohol and caffeine. °Contact a health care provider if: °· Your pain does not get better with medicine. °· You have pain with sexual intercourse. °· Your pain increases and is not controlled with medicines. °· You have abnormal vaginal bleeding with your period. °· You develop nausea or vomiting with your period that is not controlled with medicine. °Get help right away if: °You pass out. °This information is not intended to replace advice given to you by your health care provider. Make sure you discuss any questions you have with your health care provider. °Document Released: 04/30/2005 Document Revised: 10/06/2015 Document Reviewed: 10/16/2012 °Elsevier Interactive Patient Education © 2017 [employer] ° °

## 2017-04-26 ENCOUNTER — Telehealth: Payer: Self-pay | Admitting: *Deleted

## 2017-04-26 MED ORDER — AMOXICILLIN-POT CLAVULANATE 875-125 MG PO TABS: 1.0000 | ORAL_TABLET | Freq: Two times a day (BID) | ORAL | 0 refills | Status: DC

## 2017-04-26 MED ORDER — FLUCONAZOLE 150 MG PO TABS: 150.0000 mg | ORAL_TABLET | Freq: Once | ORAL | 0 refills | Status: DC

## 2017-04-26 MED FILL — AMOX-CLAV 875-125 MG TABLET: 875-125 | 10 days supply | Qty: 20 | Fill #0

## 2017-04-26 MED FILL — FLUCONAZOLE 150 MG TABLET: 150 | 5 days supply | Qty: 5 | Fill #0

## 2017-04-26 NOTE — Telephone Encounter (Signed)
Patient has a toothache and possible infection.  Per Dr. Etter Sjogren send in diflucan and augmenting 875 bid for 10 days.  Rxs [facility]  Patient notified.

## 2017-05-02 ENCOUNTER — Telehealth: Payer: Self-pay | Admitting: *Deleted

## 2017-05-02 MED ORDER — AMOXICILLIN 500 MG PO CAPS: 500.0000 mg | ORAL_CAPSULE | Freq: Three times a day (TID) | ORAL | 0 refills | Status: DC

## 2017-05-02 NOTE — Telephone Encounter (Signed)
Patient had side effects from Augmentin, diarrhea.  Per Dr. Etter Sjogren, stop and start Amoxicillin.  Rx sent into pharmacy and patient notified.

## 2017-05-17 ENCOUNTER — Other Ambulatory Visit: Payer: Self-pay | Admitting: Family Medicine

## 2017-05-17 MED ORDER — AMOXICILLIN 500 MG PO CAPS: 500.0000 mg | ORAL_CAPSULE | Freq: Three times a day (TID) | ORAL | 0 refills | Status: DC

## 2017-05-17 MED FILL — AMOXICILLIN 500 MG CAPSULE: 500 | 7 days supply | Qty: 21 | Fill #0

## 2017-05-20 ENCOUNTER — Encounter (HOSPITAL_COMMUNITY): Payer: Self-pay

## 2017-05-29 DIAGNOSIS — H5202 Hypermetropia, left eye: Secondary | ICD-10-CM | POA: Diagnosis not present

## 2017-05-29 DIAGNOSIS — H52223 Regular astigmatism, bilateral: Secondary | ICD-10-CM | POA: Diagnosis not present

## 2017-06-04 IMAGING — MR MR MRA NECK W/O CM
4 series · 26 of 48 positions shown · non-contrast
Comparison: None.

CLINICAL DATA: 30 y/o F; right-sided pulsatile tinnitus for 9
months.

EXAM:
MRA HEAD WITHOUT CONTRAST
MRA NECK WITHOUT CONTRAST
TECHNIQUE: Angiographic images of the Circle of Willis were obtained using MRA
technique without intravenous contrast. Angiographic images of the
neck were obtained using MRA technique without intravenous contrast.
Carotid stenosis measurements (when applicable) are obtained
utilizing NASCET criteria, using the distal internal carotid
diameter as the denominator.

[Series 3: fl_tof_2d · axial · 3.0mm · 0.49mm/px · z∈[-164,-36]mm · 11 of 65 slices shown]
[im 1/65]
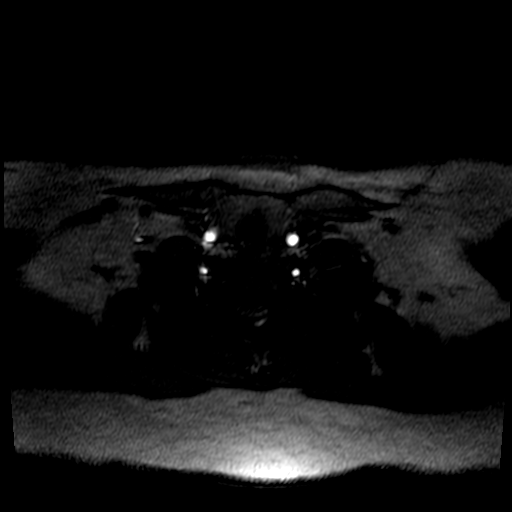
[im 7/65]
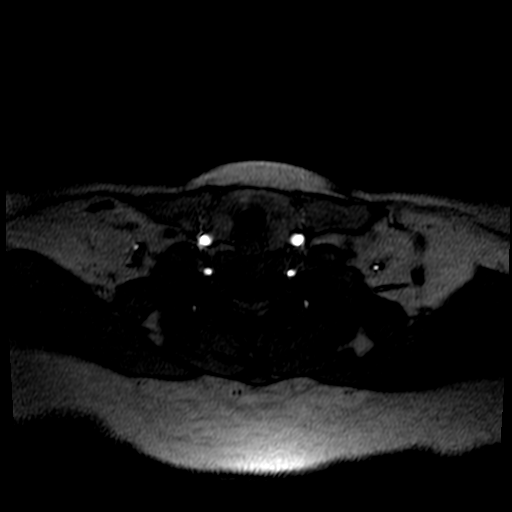
[im 13/65]
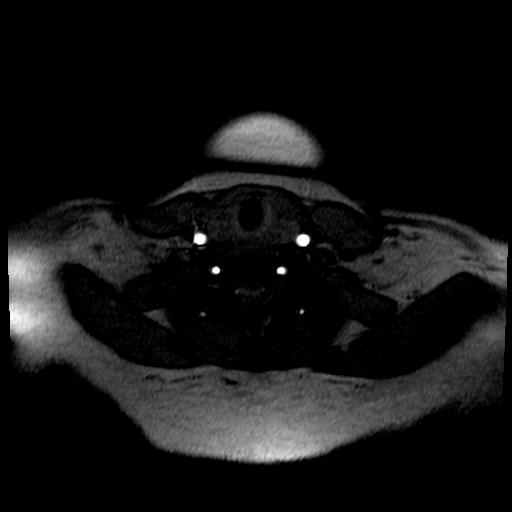
[im 20/65]
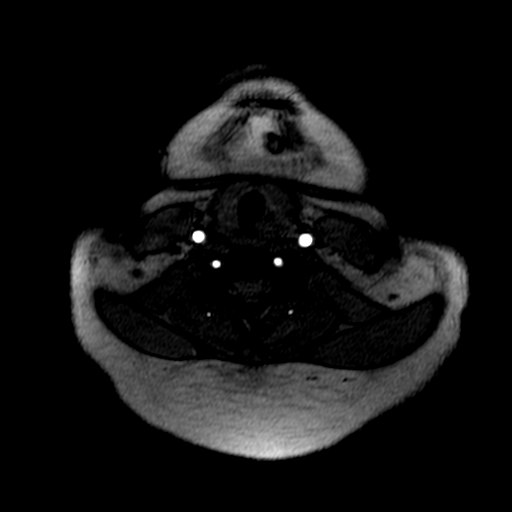
[im 26/65]
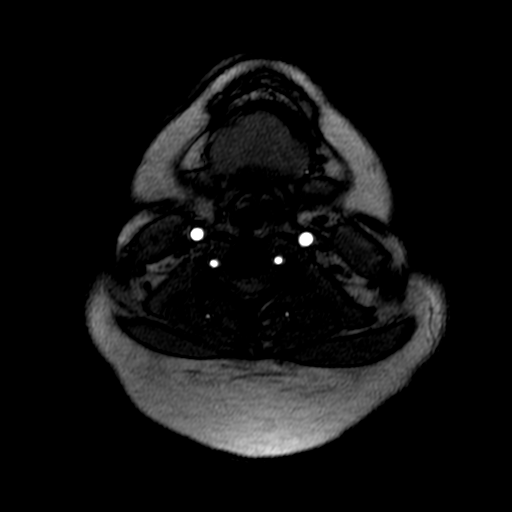
[im 33/65]
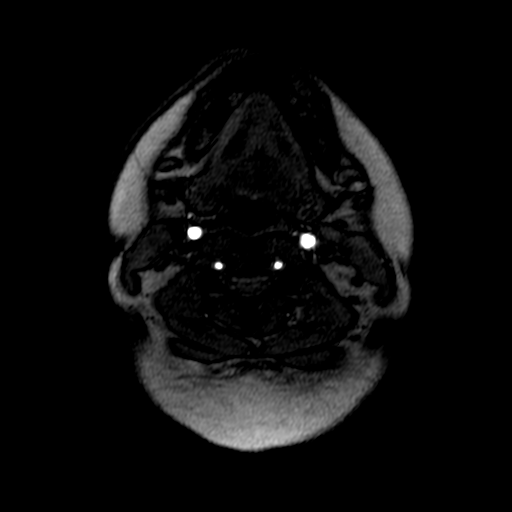
[im 39/65]
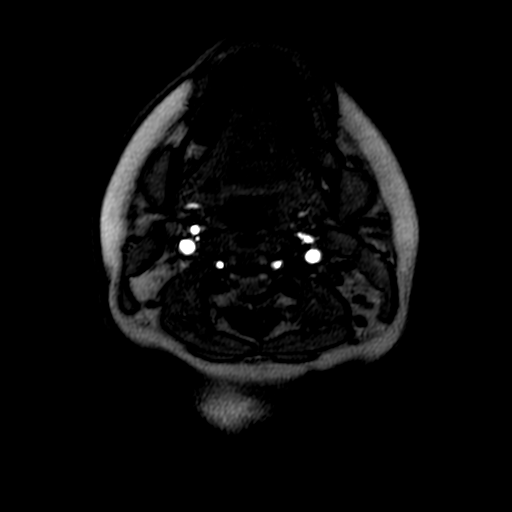
[im 45/65]
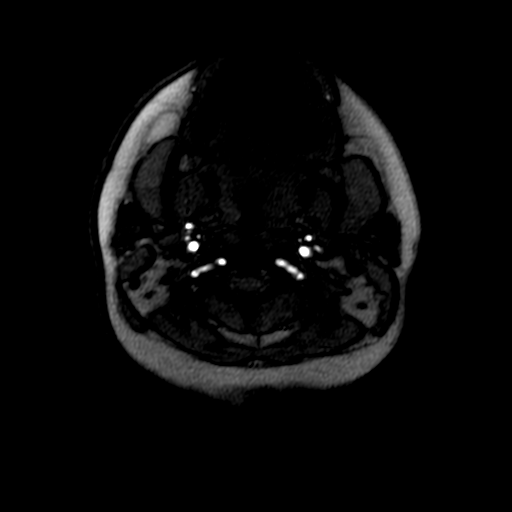
[im 52/65]
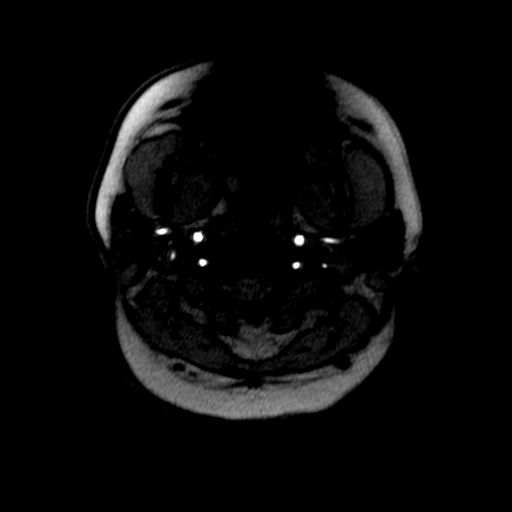
[im 58/65]
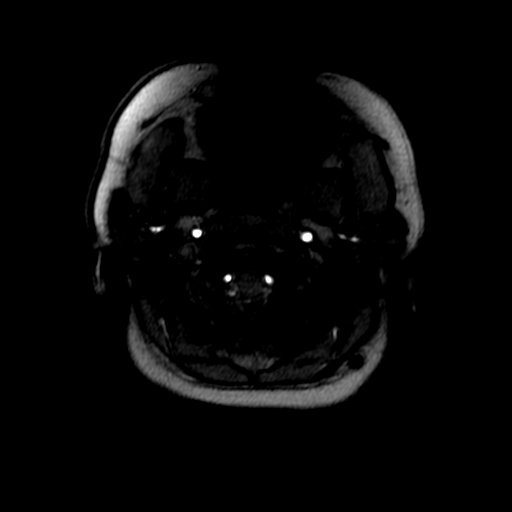
[im 65/65]
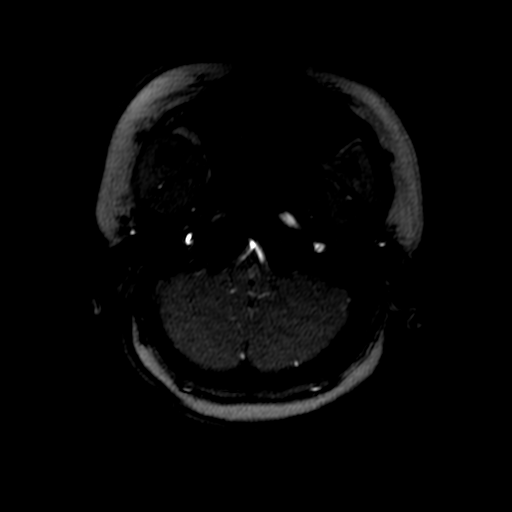

[Series 8: T1 fat-sat · axial · 5.0mm · 0.47mm/px · z∈[-162,-38]mm · 4 of 20 slices shown]
[im 1/20]
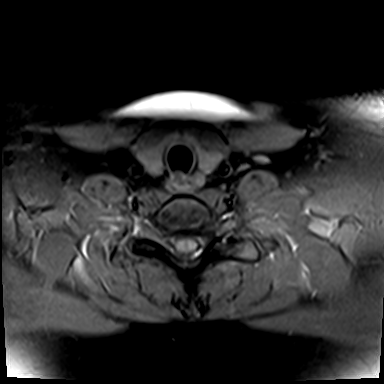
[im 7/20]
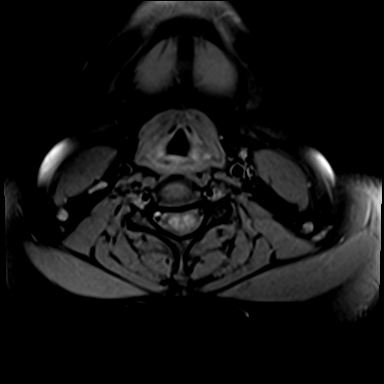
[im 13/20]
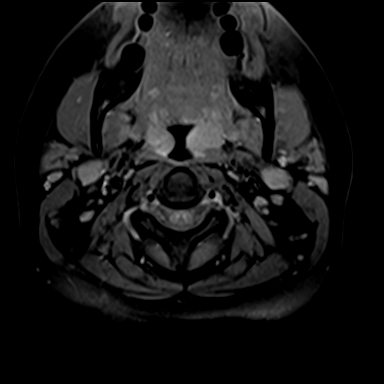
[im 20/20]
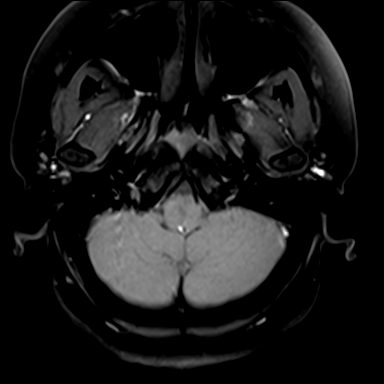

[Series 9: tof_3d_multi-slab · axial · 1.0mm · 0.52mm/px · z∈[-163,-12]mm · 10 of 168 slices shown]
[im 11/168]
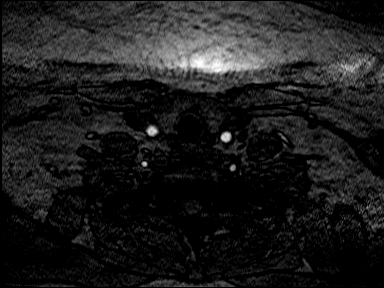
[im 27/168]
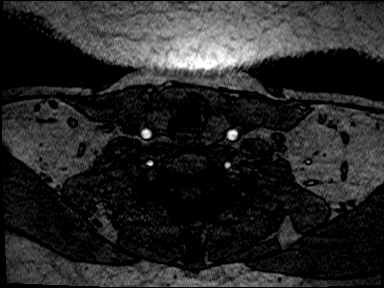
[im 33/168]
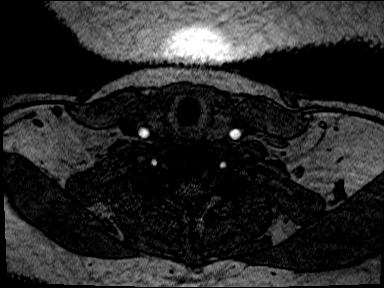
[im 54/168]
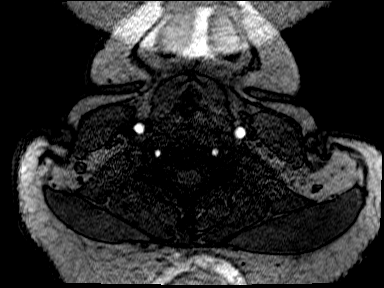
[im 76/168]
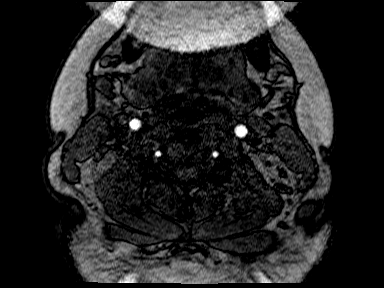
[im 87/168]
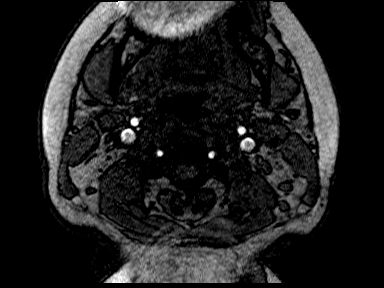
[im 97/168]
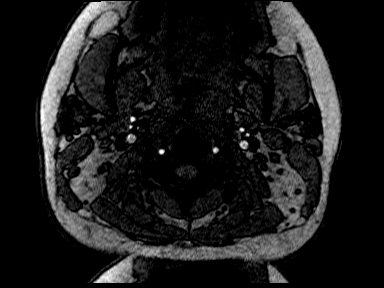
[im 119/168]
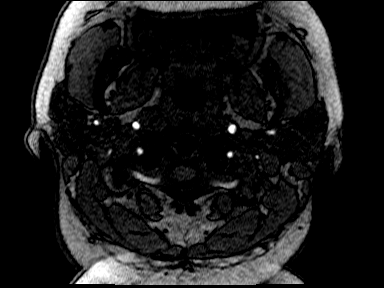
[im 141/168]
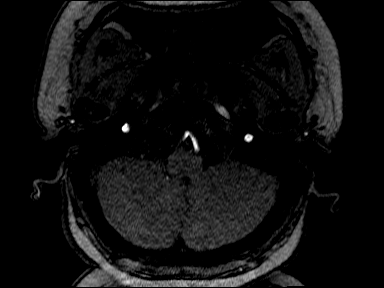
[im 162/168]
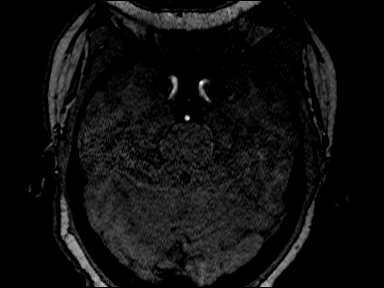

[Series 5004: lt car · axial · 0.41mm/px · 1 of 1 slices shown]
[im 1/1]
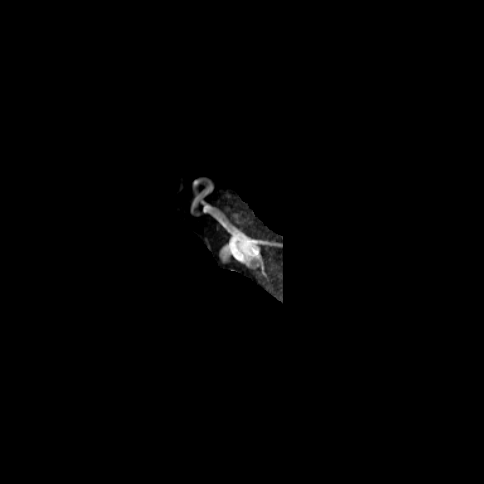

[26 of 48 positions shown; findings below may reference images not displayed]

FINDINGS: MRA HEAD FINDINGS

Bilateral internal carotid arteries, middle cerebral arteries,
anterior cerebral arteries, anterior communicating artery, bilateral
posterior communicating arteries are patent.

Apparent outpouching of the left paraclinoid internal carotid artery
is likely artifactual given that the finding is not present on the
MRA of the neck which includes this portion of the circle of Willis
within the field of view (series 2, image 77).

Bilateral vertebral arteries, bilateral PICA the basilar artery,
bilateral AICA, accessory left AICA, bilateral SCA, and bilateral
PCA are patent. The right PICA origin is not included within the
field of view.

No occlusion, aneurysm or significant stenosis of the circle of
Willis is identified.

MRA NECK FINDINGS

Carotid and vertebral arteries of the neck are widely patent. No
occlusion, aneurysm, dissection, or significant stenosis is
identified. The aortic arch and vessel origins are not included
within the field of view. No abnormal vessel wall signal on axial T1
fat saturated sequence.
IMPRESSION: 1. No occlusion, aneurysm, or significant stenosis of the circle of
Willis is identified. Normal MRA of the head.
2. No occlusion, aneurysm, dissection, or significant stenosis of
the carotid or vertebral arteries of the neck. Normal MRA of the
neck.

By: Hermano Diver M.D.

## 2017-06-06 ENCOUNTER — Other Ambulatory Visit: Payer: Self-pay | Admitting: Family Medicine

## 2017-06-06 IMAGING — US US PELVIS COMPLETE
1 series · 14 of 25 positions shown · non-contrast
Comparison: None

CLINICAL DATA: Left adnexal pain for the past month, increased in
intensity for the past week.

EXAM:
TRANSABDOMINAL AND TRANSVAGINAL ULTRASOUND OF PELVIS
TECHNIQUE: Both transabdominal and transvaginal ultrasound examinations of the
pelvis were performed. Transabdominal technique was performed for
global imaging of the pelvis including uterus, ovaries, adnexal
regions, and pelvic cul-de-sac. It was necessary to proceed with
endovaginal exam following the transabdominal exam to visualize the
uterus and ovaries in better detail.

[Series 1: us pelvis complete · 0.24mm/px · 14 of 55 slices shown]
[im 1/55]
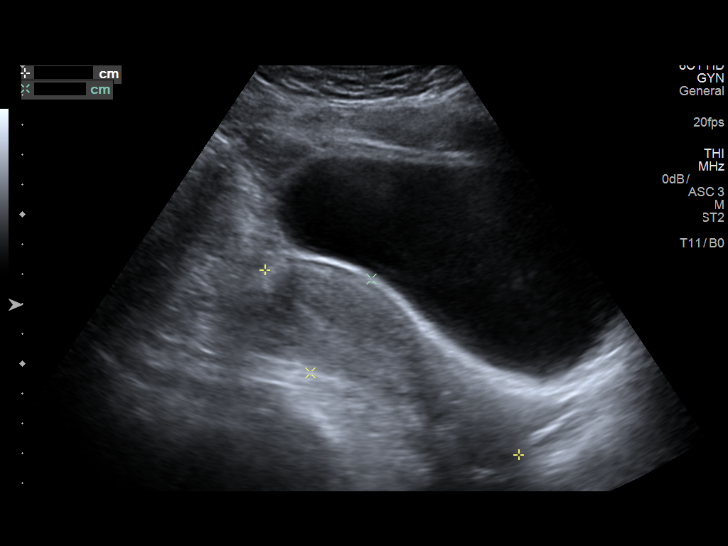
[im 5/55]
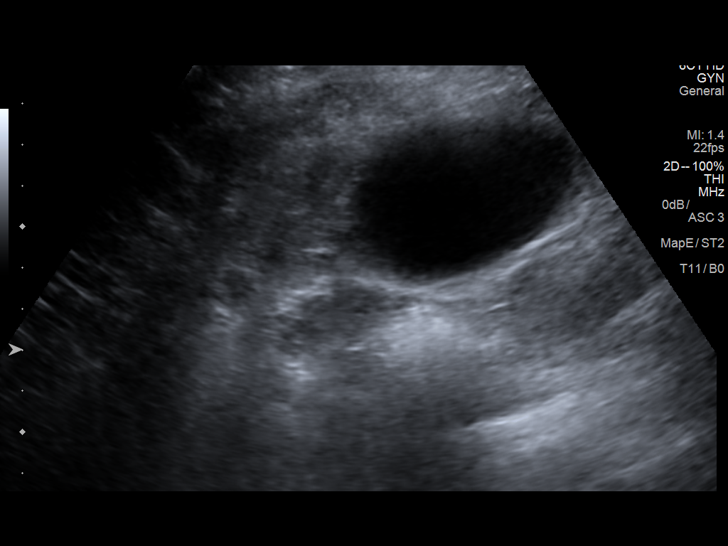
[im 10/55]
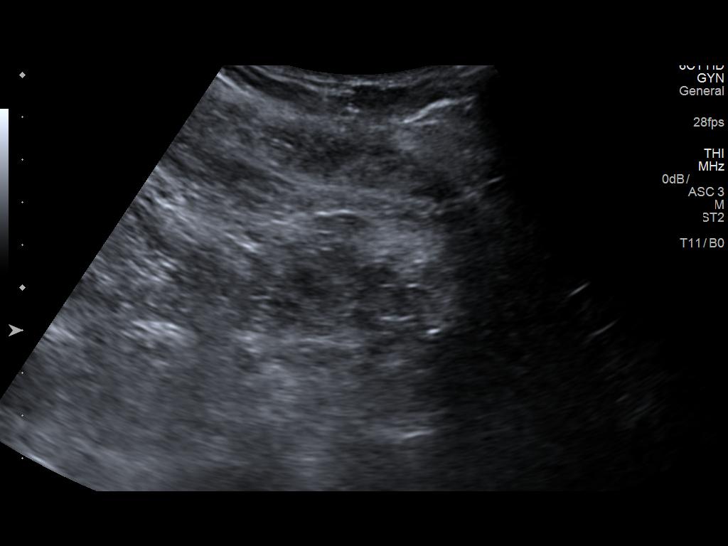
[im 14/55]
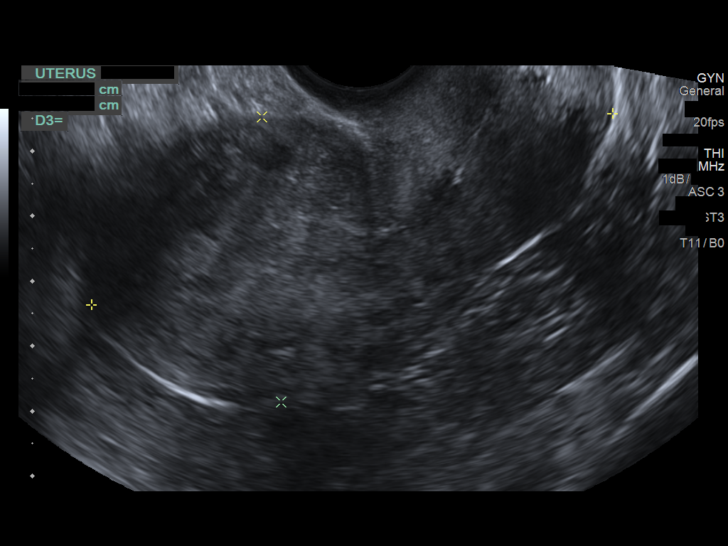
[im 19/55]
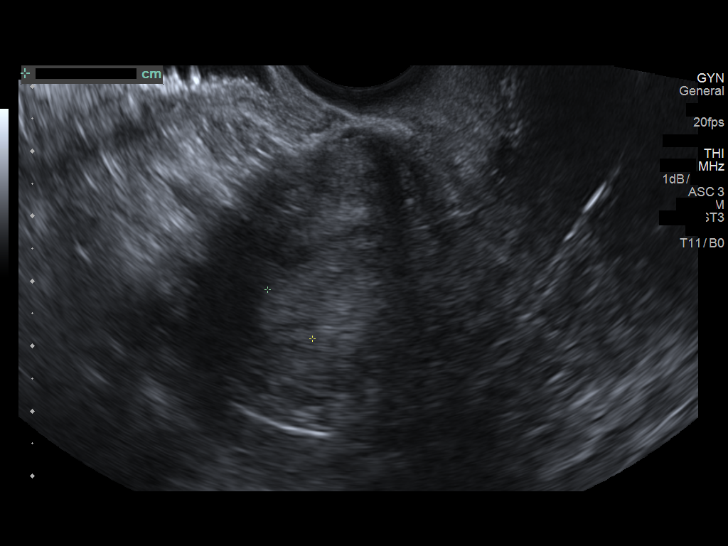
[im 21/55]
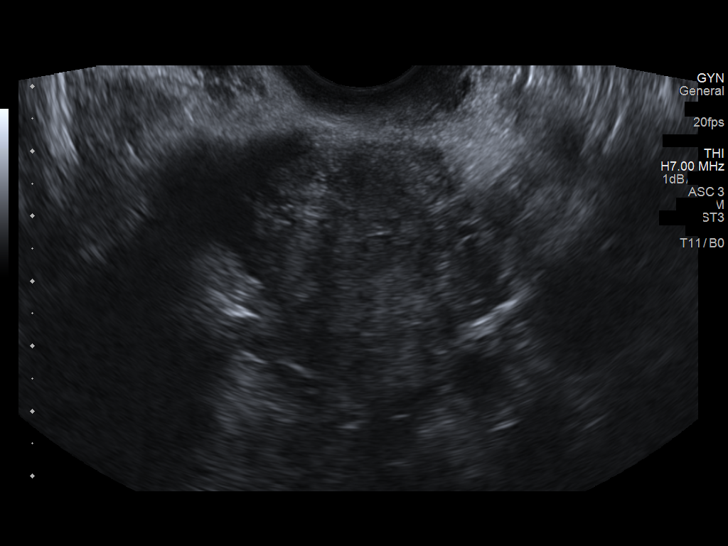
[im 25/55]
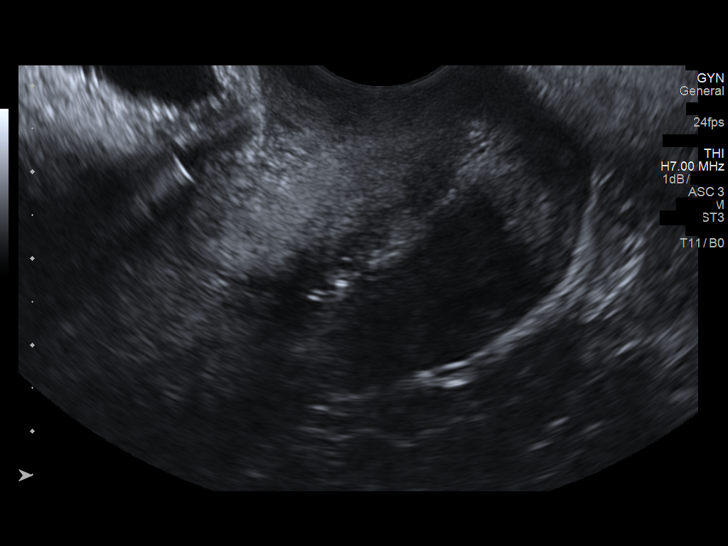
[im 30/55]
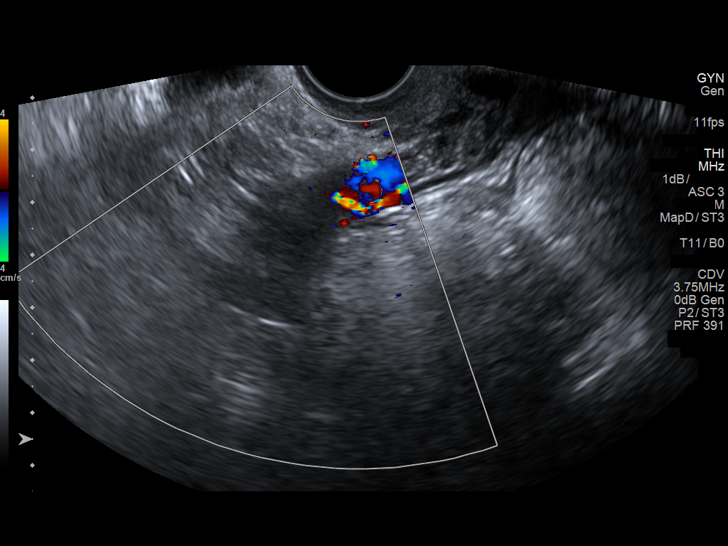
[im 34/55]
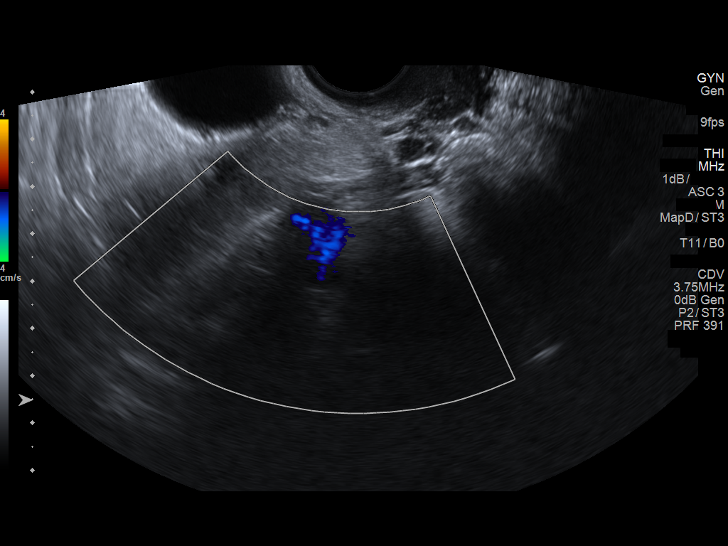
[im 37/55]
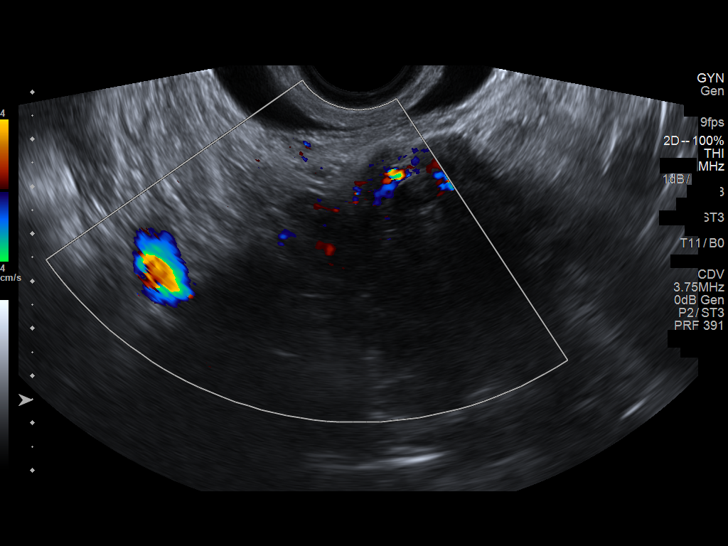
[im 41/55]
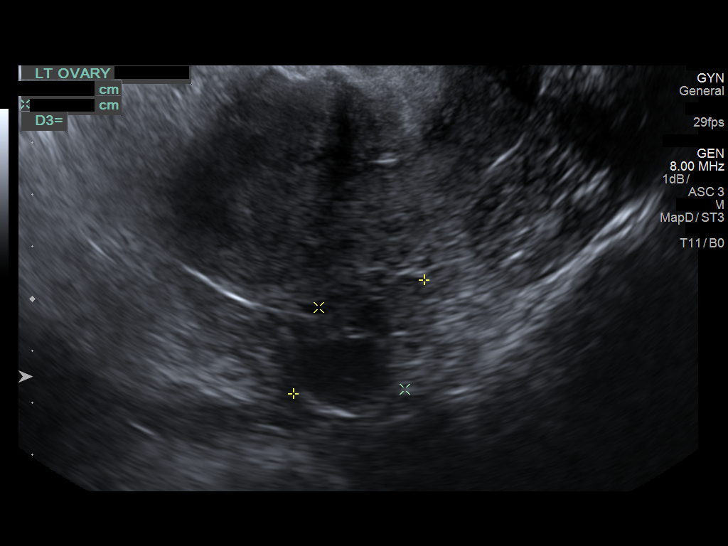
[im 46/55]
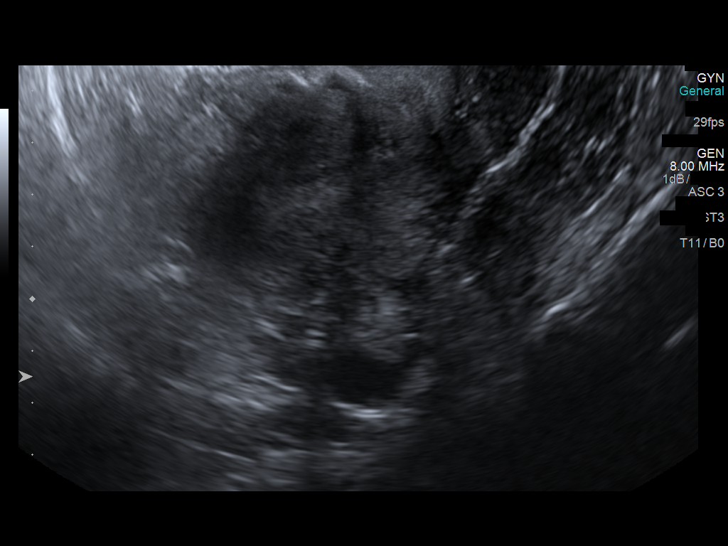
[im 50/55]
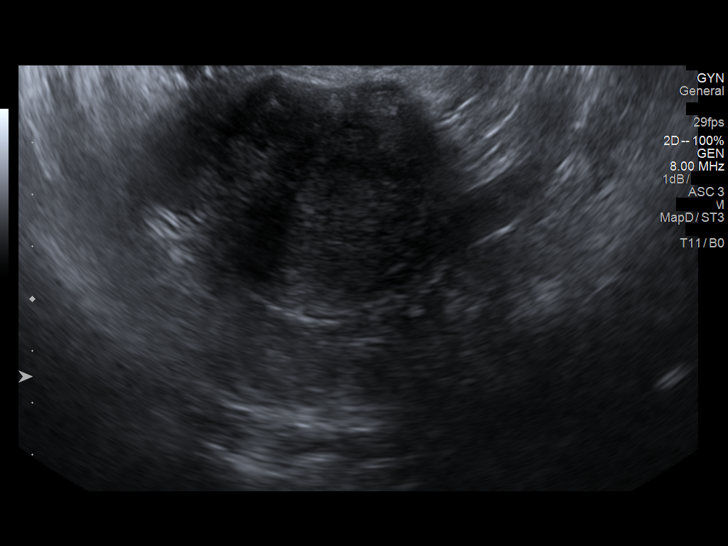
[im 55/55]
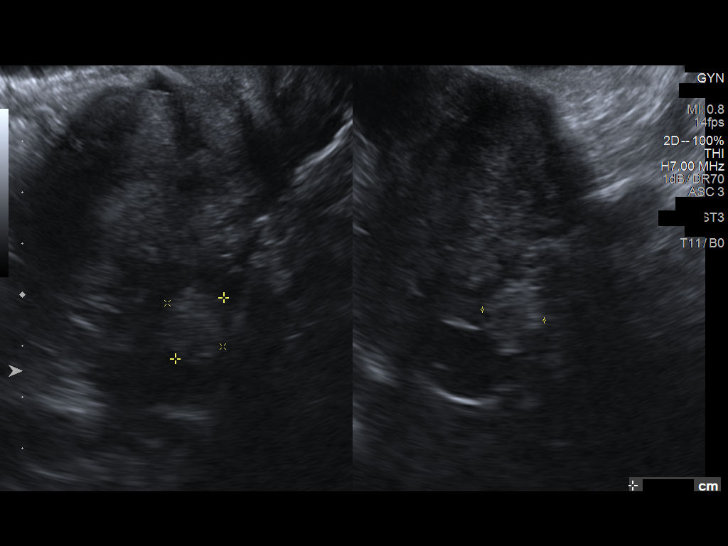

[14 of 25 positions shown; findings below may reference images not displayed]

FINDINGS: Uterus

Measurements: 8.5 x 5.0 x 4.4 cm. Two small oval myometrial masses,
one measuring 1.9 cm in maximum diameter and the other measuring
cm in maximum diameter

Endometrium

Thickness: 10.2 mm.  No focal abnormality visualized.

Right ovary

Measurements: 3.2 x 1.3 x 2.0 cm. Normal appearance/no adnexal mass.

Left ovary

Measurements: 3.3 x 2.3 x 2.6 cm. Normal appearance/no adnexal mass.

Other findings

No abnormal free fluid.
IMPRESSION: Two small uterine fibroids. Neither has a submucosal component.
Otherwise, unremarkable examination.

## 2017-06-06 MED ORDER — AMOXICILLIN 500 MG PO CAPS: 500.0000 mg | ORAL_CAPSULE | Freq: Three times a day (TID) | ORAL | 0 refills | Status: DC

## 2017-06-06 MED FILL — AMOXICILLIN 500 MG CAPSULE: 500 | 7 days supply | Qty: 21 | Fill #0

## 2017-06-10 ENCOUNTER — Other Ambulatory Visit (HOSPITAL_COMMUNITY)
Admission: RE | Admit: 2017-06-10 | Discharge: 2017-06-10 | Disposition: A | Discharge status: Home / Self Care | Payer: 59 | Source: Ambulatory Visit | Attending: Family Medicine | Admitting: Family Medicine

## 2017-06-10 ENCOUNTER — Ambulatory Visit (INDEPENDENT_AMBULATORY_CARE_PROVIDER_SITE_OTHER): Payer: 59 | Admitting: Family Medicine

## 2017-06-10 ENCOUNTER — Encounter: Payer: Self-pay | Admitting: Family Medicine

## 2017-06-10 VITALS — BP 120/80 | HR 81 | Temp 98.2°F | Resp 16

## 2017-06-10 DIAGNOSIS — Z7251 High risk heterosexual behavior: Secondary | ICD-10-CM | POA: Insufficient documentation

## 2017-06-10 DIAGNOSIS — M545 Low back pain, unspecified: Secondary | ICD-10-CM

## 2017-06-10 DIAGNOSIS — M5440 Lumbago with sciatica, unspecified side: Secondary | ICD-10-CM

## 2017-06-10 DIAGNOSIS — N912 Amenorrhea, unspecified: Secondary | ICD-10-CM

## 2017-06-10 LAB — RPR: RPR Ser Ql: NONREACTIVE

## 2017-06-10 LAB — HIV ANTIBODY (ROUTINE TESTING W REFLEX): HIV 1&2 Ab, 4th Generation: NONREACTIVE

## 2017-06-10 LAB — HCG, SERUM, QUALITATIVE: PREG SERUM: NEGATIVE

## 2017-06-10 MED ORDER — PREDNISONE 10 MG PO TABS: ORAL_TABLET | ORAL | 0 refills | Status: DC

## 2017-06-10 MED ORDER — CYCLOBENZAPRINE HCL 10 MG PO TABS: 10.0000 mg | ORAL_TABLET | Freq: Three times a day (TID) | ORAL | 0 refills | Status: DC | PRN

## 2017-06-10 MED FILL — predniSONE 10 MG TABS: 10 | 12 days supply | Qty: 20 | Fill #0

## 2017-06-10 MED FILL — CYCLOBENZAPRINE HCL 10 MG T: 10 | 10 days supply | Qty: 30 | Fill #0

## 2017-06-10 NOTE — Progress Notes (Signed)
Patient ID: Katherine Mcfarland, female    DOB: 1986-02-06  Age: 32 y.o. MRN: [id number]    Subjective:  Subjective  HPI Katherine Mcfarland presents for low back pain.  It started this am when she stepped out of her bedroom-- sudden pain in low back and states her legs feel funny b/l.    Review of Systems  Constitutional: Negative for appetite change, diaphoresis, fatigue and unexpected weight change.  Eyes: Negative for pain, redness and visual disturbance.  Respiratory: Negative for cough, chest tightness, shortness of breath and wheezing.   Cardiovascular: Negative for chest pain, palpitations and leg swelling.  Endocrine: Negative for cold intolerance, heat intolerance, polydipsia, polyphagia and polyuria.  Genitourinary: Negative for difficulty urinating, dysuria and frequency.  Musculoskeletal: Positive for back pain.  Neurological: Negative for dizziness, light-headedness, numbness and headaches.    History Past Medical History:  Diagnosis Date  . Allergy    pt. reports seasonal allergies  . Headache   . Lactose intolerance   . Palpitations   . Right knee pain   . Tinnitus     She has a past surgical history that includes Cesarean section and Wisdom tooth extraction.   Her family history includes Diabetes in her maternal grandmother; Hyperlipidemia in her mother; Hypertension in her father, maternal grandmother, and mother.She reports that  has never smoked. she has never used smokeless tobacco. She reports that she drinks alcohol. She reports that she does not use drugs.  Current Outpatient Medications on File Prior to Visit  Medication Sig Dispense Refill  . amoxicillin (AMOXIL) 500 MG capsule Take 1 capsule (500 mg total) by mouth 3 (three) times daily. 21 capsule 0  . amoxicillin-clavulanate (AUGMENTIN) 875-125 MG tablet Take 1 tablet by mouth 2 (two) times daily. 20 tablet 0  . buPROPion (WELLBUTRIN SR) 150 MG 12 hr tablet Take 1 tablet (150 mg total) by mouth daily.  (Patient not taking: Reported on 04/24/2017) 30 tablet 1  . diclofenac (CATAFLAM) 50 MG tablet Take 1 tablet (50 mg total) by mouth 3 (three) times daily with meals as needed. 30 tablet 1  . fluticasone (FLONASE) 50 MCG/ACT nasal spray Place 2 sprays into both nostrils daily. (Patient not taking: Reported on 04/24/2017) 16 g 6  . levocetirizine (XYZAL) 5 MG tablet Take 5 mg by mouth every evening. Take 10mg  prn    . metFORMIN (GLUCOPHAGE) 500 MG tablet Take 1 tablet (500 mg total) by mouth daily after breakfast. (Patient not taking: Reported on 04/24/2017) 30 tablet 0  . montelukast (SINGULAIR) 10 MG tablet Take 1 tablet by mouth daily.  5  . Prenatal Multivit-Min-Fe-FA (PRENATAL VITAMINS) 0.8 MG tablet Take 1 tablet by mouth daily. 30 tablet 12  . SUMAtriptan (IMITREX) 100 MG tablet Take one tablet at onset of headache. May repeat in 2 hours. No more than 2 pills in 24 hours.   Follow-up with me on Monday to see how this is working. 10 tablet 0  . Vitamin D, Ergocalciferol, (DRISDOL) 50000 units CAPS capsule Take 1 capsule (50,000 Units total) by mouth every 7 (seven) days. (Patient not taking: Reported on 04/24/2017) 4 capsule 0   No current facility-administered medications on file prior to visit.      Objective:  Objective  Physical Exam  Constitutional: She is oriented to person, place, and time. She appears well-developed and well-nourished.  HENT:  Head: Normocephalic and atraumatic.  Eyes: Conjunctivae and EOM are normal.  Neck: Normal range of motion. Neck supple. No JVD  present. Carotid bruit is not present. No thyromegaly present.  Cardiovascular: Normal rate, regular rhythm and normal heart sounds.  No murmur heard. Pulmonary/Chest: Effort normal and breath sounds normal. No respiratory distress. She has no wheezes. She has no rales. She exhibits no tenderness.  Musculoskeletal: She exhibits no edema.  Neurological: She is alert and oriented to person, place, and time. She  displays abnormal reflex. No cranial nerve deficit. Coordination normal.  dtr dec b/l 3/5 strength L low leg ext 5/5 strength hip flexors b/l  slr + L 0 degrees  Normal R SLR   Psychiatric: She has a normal mood and affect.  Nursing note reviewed.  BP 120/80 (BP Location: Left Arm, Cuff Size: Normal)   Pulse 81   Temp 98.2 F (36.8 C) (Oral)   Resp 16   SpO2 98%  Wt Readings from Last 3 Encounters:  04/24/17 244 lb (110.7 kg)  02/13/17 235 lb (106.6 kg)  01/25/17 237 lb (107.5 kg)     Lab Results  Component Value Date   WBC WILL FOLLOW 11/01/2016   HGB WILL FOLLOW 11/01/2016   HCT WILL FOLLOW 11/01/2016   PLT WILL FOLLOW 11/01/2016   GLUCOSE 114 (H) 09/26/2016   CHOL 171 09/26/2016   TRIG 96 09/26/2016   HDL 45 09/26/2016   LDLCALC 107 (H) 09/26/2016   ALT 19 09/26/2016   AST 20 09/26/2016   NA 140 09/26/2016   K 4.1 09/26/2016   CL 101 09/26/2016   CREATININE 0.85 09/26/2016   BUN 12 09/26/2016   CO2 23 09/26/2016   TSH 1.150 09/26/2016   HGBA1C 6.1 (H) 09/26/2016    No results found.   Assessment & Plan:  Plan  I am having Katherine Mcfarland start on predniSONE and cyclobenzaprine. I am also having her maintain her SUMAtriptan, montelukast, levocetirizine, metFORMIN, Prenatal Vitamins, fluticasone, Vitamin D (Ergocalciferol), buPROPion, diclofenac, amoxicillin-clavulanate, and amoxicillin.  Meds ordered this encounter  Medications  . predniSONE (DELTASONE) 10 MG tablet    Sig: TAKE 3 TABLETS PO QD FOR 3 DAYS THEN TAKE 2 TABLETS PO QD FOR 3 DAYS THEN TAKE 1 TABLET PO QD FOR 3 DAYS THEN TAKE 1/2 TAB PO QD FOR 3 DAYS    Dispense:  20 tablet    Refill:  0  . cyclobenzaprine (FLEXERIL) 10 MG tablet    Sig: Take 1 tablet (10 mg total) by mouth 3 (three) times daily as needed for muscle spasms.    Dispense:  30 tablet    Refill:  0    Problem List Items Addressed This Visit    None    Visit Diagnoses    Low back pain with radiation    -  Primary    Relevant Medications   predniSONE (DELTASONE) 10 MG tablet   cyclobenzaprine (FLEXERIL) 10 MG tablet   Other Relevant Orders   DG Lumbar Spine Complete   Amenorrhea       Relevant Orders   hCG, serum, qualitative   High risk heterosexual behavior       Relevant Orders   HIV antibody   RPR   Urine cytology ancillary only   HSV(herpes simplex vrs) 1+2 ab-IgG   HSV 1/2 Ab (IgM), IFA w/rflx Titer      Follow-up: Return if symptoms worsen or fail to improve.  Ann Held, DO

## 2017-06-10 NOTE — Patient Instructions (Signed)

## 2017-06-10 NOTE — Addendum Note (Signed)
Addended by: Roma Schanz R on: 06/10/2017 11:06 AM   Modules accepted: Orders

## 2017-06-11 LAB — URINE CYTOLOGY ANCILLARY ONLY
CHLAMYDIA, DNA PROBE: NEGATIVE
Neisseria Gonorrhea: NEGATIVE
Trichomonas: NEGATIVE

## 2017-06-12 ENCOUNTER — Telehealth: Payer: Self-pay | Admitting: *Deleted

## 2017-06-12 NOTE — Telephone Encounter (Signed)
Received results from [facility] [facility]; forwarded to provider/SLS 01/30

## 2017-06-13 LAB — URINE CYTOLOGY ANCILLARY ONLY
BACTERIAL VAGINITIS: POSITIVE — AB
CANDIDA VAGINITIS: NEGATIVE

## 2017-06-13 LAB — HSV 1/2 AB (IGM), IFA W/RFLX TITER
HSV 1 IgM Screen: NEGATIVE
HSV 2 IgM Screen: NEGATIVE

## 2017-06-13 LAB — HSV(HERPES SIMPLEX VRS) I + II AB-IGG: HSV 1 IGG, TYPE SPEC: 30.5 {index} — AB

## 2017-06-14 ENCOUNTER — Telehealth: Payer: Self-pay | Admitting: *Deleted

## 2017-06-14 ENCOUNTER — Other Ambulatory Visit: Payer: Self-pay | Admitting: Family Medicine

## 2017-06-14 ENCOUNTER — Other Ambulatory Visit: Payer: Self-pay | Admitting: *Deleted

## 2017-06-14 MED ORDER — LORCASERIN HCL ER 20 MG PO TB24: 1.0000 | ORAL_TABLET | Freq: Every day | ORAL | 2 refills | Status: DC

## 2017-06-14 NOTE — Telephone Encounter (Signed)
Received Lab Report results from [facility]; forwarded to provider/SLS 02/01

## 2017-06-17 ENCOUNTER — Telehealth: Payer: Self-pay

## 2017-06-17 NOTE — Telephone Encounter (Signed)
PA initiated via Covermymeds; KEY: [id number]. Awaiting determination.

## 2017-06-18 ENCOUNTER — Other Ambulatory Visit: Payer: Self-pay | Admitting: *Deleted

## 2017-06-18 ENCOUNTER — Other Ambulatory Visit: Payer: Self-pay | Admitting: Family Medicine

## 2017-06-18 DIAGNOSIS — N760 Acute vaginitis: Secondary | ICD-10-CM

## 2017-06-18 DIAGNOSIS — B9689 Other specified bacterial agents as the cause of diseases classified elsewhere: Secondary | ICD-10-CM

## 2017-06-18 MED ORDER — FLUCONAZOLE 150 MG PO TABS: 150.0000 mg | ORAL_TABLET | Freq: Once | ORAL | 0 refills | Status: AC

## 2017-06-18 MED ORDER — CLINDAMYCIN HCL 300 MG PO CAPS: 300.0000 mg | ORAL_CAPSULE | Freq: Three times a day (TID) | ORAL | 0 refills | Status: DC

## 2017-06-18 MED ORDER — CLINDAMYCIN HCL 300 MG PO CAPS: 300.0000 mg | ORAL_CAPSULE | Freq: Two times a day (BID) | ORAL | 0 refills | Status: DC

## 2017-06-18 MED FILL — FLUCONAZOLE 150 MG TABLET: 150 | 2 days supply | Qty: 2 | Fill #0

## 2017-06-18 MED FILL — CLINDAMYCIN HCL 300 MG CAPS: 300 | 7 days supply | Qty: 14 | Fill #0

## 2017-06-19 ENCOUNTER — Encounter: Payer: Self-pay | Admitting: Obstetrics & Gynecology

## 2017-06-24 NOTE — Telephone Encounter (Signed)
PA approved effective- 06/18/2017 through 09/14/2017.

## 2017-06-25 ENCOUNTER — Encounter: Payer: Self-pay | Admitting: Family Medicine

## 2017-06-25 ENCOUNTER — Ambulatory Visit (INDEPENDENT_AMBULATORY_CARE_PROVIDER_SITE_OTHER): Payer: 59 | Admitting: Family Medicine

## 2017-06-25 VITALS — BP 128/86 | HR 95 | Temp 98.0°F | Resp 16 | Ht 66.0 in | Wt 242.0 lb

## 2017-06-25 DIAGNOSIS — Z79899 Other long term (current) drug therapy: Secondary | ICD-10-CM

## 2017-06-25 MED ORDER — PHENTERMINE HCL 37.5 MG PO TABS: 37.5000 mg | ORAL_TABLET | Freq: Every day | ORAL | 0 refills | Status: DC

## 2017-06-25 MED FILL — PHENTERMINE 37.5 MG TABLET: 37.5 | 30 days supply | Qty: 30 | Fill #0

## 2017-06-25 NOTE — Patient Instructions (Signed)
Exercising to Lose Weight Exercising can help you to lose weight. In order to lose weight through exercise, you need to do vigorous-intensity exercise. You can tell that you are exercising with vigorous intensity if you are breathing very hard and fast and cannot hold a conversation while exercising. Moderate-intensity exercise helps to maintain your current weight. You can tell that you are exercising at a moderate level if you have a higher heart rate and faster breathing, but you are still able to hold a conversation. How often should I exercise? Choose an activity that you enjoy and set realistic goals. Your health care provider can help you to make an activity plan that works for you. Exercise regularly as directed by your health care provider. This may include:  Doing resistance training twice each week, such as: ? Push-ups. ? Sit-ups. ? Lifting weights. ? Using resistance bands.  Doing a given intensity of exercise for a given amount of time. Choose from these options: ? 150 minutes of moderate-intensity exercise every week. ? 75 minutes of vigorous-intensity exercise every week. ? A mix of moderate-intensity and vigorous-intensity exercise every week.  Children, pregnant women, people who are out of shape, people who are overweight, and older adults may need to consult a health care provider for individual recommendations. If you have any sort of medical condition, be sure to consult your health care provider before starting a new exercise program. What are some activities that can help me to lose weight?  Walking at a rate of at least 4.5 miles an hour.  Jogging or running at a rate of 5 miles per hour.  Biking at a rate of at least 10 miles per hour.  Lap swimming.  Roller-skating or in-line skating.  Cross-country skiing.  Vigorous competitive sports, such as football, basketball, and soccer.  Jumping rope.  Aerobic dancing. How can I be more active in my day-to-day  activities?  Use the stairs instead of the elevator.  Take a walk during your lunch break.  If you drive, park your car farther away from work or school.  If you take public transportation, get off one stop early and walk the rest of the way.  Make all of your phone calls while standing up and walking around.  Get up, stretch, and walk around every 30 minutes throughout the day. What guidelines should I follow while exercising?  Do not exercise so much that you hurt yourself, feel dizzy, or get very short of breath.  Consult your health care provider prior to starting a new exercise program.  Wear comfortable clothes and shoes with good support.  Drink plenty of water while you exercise to prevent dehydration or heat stroke. Body water is lost during exercise and must be replaced.  Work out until you breathe faster and your heart beats faster. This information is not intended to replace advice given to you by your health care provider. Make sure you discuss any questions you have with your health care provider. Document Released: 06/02/2010 Document Revised: 10/06/2015 Document Reviewed: 10/01/2013 Elsevier Interactive Patient Education  2018 [employer]  

## 2017-06-25 NOTE — Progress Notes (Signed)
Patient ID: Katherine Mcfarland, female    DOB: 06-09-1985  Age: 32 y.o. MRN: [id number]    Subjective:  Subjective  HPI Katherine Mcfarland presents for trouble losing weight.  She is unable to get belviq due to cost.  saxenda cause palpitations .  She has tried phenteramine in the past with no problems.    Review of Systems  Constitutional: Negative for activity change, appetite change, diaphoresis, fatigue and unexpected weight change.  Eyes: Negative for pain, redness and visual disturbance.  Respiratory: Negative for cough, chest tightness, shortness of breath and wheezing.   Cardiovascular: Negative for chest pain, palpitations and leg swelling.  Endocrine: Negative for cold intolerance, heat intolerance, polydipsia, polyphagia and polyuria.  Genitourinary: Negative for difficulty urinating, dysuria and frequency.  Neurological: Negative for dizziness, light-headedness, numbness and headaches.  Psychiatric/Behavioral: Negative for behavioral problems and dysphoric mood. The patient is not nervous/anxious.     History Past Medical History:  Diagnosis Date  . Allergy    pt. reports seasonal allergies  . Headache   . Lactose intolerance   . Palpitations   . Right knee pain   . Tinnitus     She has a past surgical history that includes Cesarean section and Wisdom tooth extraction.   Her family history includes Diabetes in her maternal grandmother; Hyperlipidemia in her mother; Hypertension in her father, maternal grandmother, and mother.She reports that  has never smoked. she has never used smokeless tobacco. She reports that she drinks alcohol. She reports that she does not use drugs.  Current Outpatient Medications on File Prior to Visit  Medication Sig Dispense Refill  . amoxicillin (AMOXIL) 500 MG capsule Take 1 capsule (500 mg total) by mouth 3 (three) times daily. 21 capsule 0  . amoxicillin-clavulanate (AUGMENTIN) 875-125 MG tablet Take 1 tablet by mouth 2 (two) times daily.  20 tablet 0  . buPROPion (WELLBUTRIN SR) 150 MG 12 hr tablet Take 1 tablet (150 mg total) by mouth daily. (Patient not taking: Reported on 04/24/2017) 30 tablet 1  . clindamycin (CLEOCIN) 300 MG capsule Take 1 capsule (300 mg total) by mouth 2 (two) times daily. for 7 days 14 capsule 0  . cyclobenzaprine (FLEXERIL) 10 MG tablet Take 1 tablet (10 mg total) by mouth 3 (three) times daily as needed for muscle spasms. 30 tablet 0  . diclofenac (CATAFLAM) 50 MG tablet Take 1 tablet (50 mg total) by mouth 3 (three) times daily with meals as needed. 30 tablet 1  . fluticasone (FLONASE) 50 MCG/ACT nasal spray Place 2 sprays into both nostrils daily. (Patient not taking: Reported on 04/24/2017) 16 g 6  . levocetirizine (XYZAL) 5 MG tablet Take 5 mg by mouth every evening. Take 10mg  prn    . Lorcaserin HCl ER (BELVIQ XR) 20 MG TB24 Take 1 tablet by mouth daily after breakfast. 30 tablet 2  . metFORMIN (GLUCOPHAGE) 500 MG tablet Take 1 tablet (500 mg total) by mouth daily after breakfast. (Patient not taking: Reported on 04/24/2017) 30 tablet 0  . montelukast (SINGULAIR) 10 MG tablet Take 1 tablet by mouth daily.  5  . predniSONE (DELTASONE) 10 MG tablet TAKE 3 TABLETS PO QD FOR 3 DAYS THEN TAKE 2 TABLETS PO QD FOR 3 DAYS THEN TAKE 1 TABLET PO QD FOR 3 DAYS THEN TAKE 1/2 TAB PO QD FOR 3 DAYS 20 tablet 0  . Prenatal Multivit-Min-Fe-FA (PRENATAL VITAMINS) 0.8 MG tablet Take 1 tablet by mouth daily. 30 tablet 12  . SUMAtriptan (IMITREX)  100 MG tablet Take one tablet at onset of headache. May repeat in 2 hours. No more than 2 pills in 24 hours.   Follow-up with me on Monday to see how this is working. 10 tablet 0  . Vitamin D, Ergocalciferol, (DRISDOL) 50000 units CAPS capsule Take 1 capsule (50,000 Units total) by mouth every 7 (seven) days. (Patient not taking: Reported on 04/24/2017) 4 capsule 0   No current facility-administered medications on file prior to visit.      Objective:  Objective  Physical Exam    Constitutional: She is oriented to person, place, and time. She appears well-developed and well-nourished. No distress.  HENT:  Head: Normocephalic and atraumatic.  Right Ear: External ear normal.  Left Ear: External ear normal.  Nose: Nose normal.  Mouth/Throat: Oropharynx is clear and moist.  Eyes: Conjunctivae and EOM are normal. Pupils are equal, round, and reactive to light.  Neck: Normal range of motion. Neck supple. No JVD present. Carotid bruit is not present. No thyromegaly present.  Cardiovascular: Normal rate, regular rhythm and normal heart sounds.  No murmur heard. Pulmonary/Chest: Effort normal and breath sounds normal. No respiratory distress. She has no wheezes. She has no rales. She exhibits no tenderness.  Musculoskeletal: She exhibits no edema.  Neurological: She is alert and oriented to person, place, and time.  Psychiatric: She has a normal mood and affect. Her behavior is normal. Judgment and thought content normal.  Nursing note and vitals reviewed.  BP 128/86 (BP Location: Left Arm, Cuff Size: Large)   Pulse 95   Temp 98 F (36.7 C) (Oral)   Resp 16   Ht 5\' 6"  (1.676 m)   Wt 242 lb (109.8 kg)   SpO2 98%   BMI 39.06 kg/m  Wt Readings from Last 3 Encounters:  06/25/17 242 lb (109.8 kg)  04/24/17 244 lb (110.7 kg)  02/13/17 235 lb (106.6 kg)     Lab Results  Component Value Date   WBC WILL FOLLOW 11/01/2016   HGB WILL FOLLOW 11/01/2016   HCT WILL FOLLOW 11/01/2016   PLT WILL FOLLOW 11/01/2016   GLUCOSE 114 (H) 09/26/2016   CHOL 171 09/26/2016   TRIG 96 09/26/2016   HDL 45 09/26/2016   LDLCALC 107 (H) 09/26/2016   ALT 19 09/26/2016   AST 20 09/26/2016   NA 140 09/26/2016   K 4.1 09/26/2016   CL 101 09/26/2016   CREATININE 0.85 09/26/2016   BUN 12 09/26/2016   CO2 23 09/26/2016   TSH 1.150 09/26/2016   HGBA1C 6.1 (H) 09/26/2016    No results found.   Assessment & Plan:  Plan  I am having Katherine Mcfarland start on phentermine. I am  also having her maintain her SUMAtriptan, montelukast, levocetirizine, metFORMIN, Prenatal Vitamins, fluticasone, Vitamin D (Ergocalciferol), buPROPion, diclofenac, amoxicillin-clavulanate, amoxicillin, predniSONE, cyclobenzaprine, Lorcaserin HCl ER, and clindamycin.  Meds ordered this encounter  Medications  . phentermine (ADIPEX-P) 37.5 MG tablet    Sig: Take 1 tablet (37.5 mg total) by mouth daily before breakfast.    Dispense:  30 tablet    Refill:  0    Problem List Items Addressed This Visit      Unprioritized   Severe obesity (BMI >= 40) (HCC)    Discussed diet and exercise rx phenteramine for 3 months only rx sent to pharmacy       Relevant Medications   phentermine (ADIPEX-P) 37.5 MG tablet    Other Visit Diagnoses    High risk medication  use    -  Primary   Relevant Orders   EKG 12-Lead (Completed)   Morbid obesity (HCC)       Relevant Medications   phentermine (ADIPEX-P) 37.5 MG tablet      Follow-up: No Follow-up on file.  Ann Held, DO

## 2017-06-25 NOTE — Assessment & Plan Note (Signed)
Discussed diet and exercise rx phenteramine for 3 months only rx sent to pharmacy

## 2017-07-26 ENCOUNTER — Ambulatory Visit (INDEPENDENT_AMBULATORY_CARE_PROVIDER_SITE_OTHER): Payer: 59 | Admitting: Family Medicine

## 2017-07-26 ENCOUNTER — Encounter: Payer: Self-pay | Admitting: Family Medicine

## 2017-07-26 MED ORDER — PHENTERMINE HCL 37.5 MG PO TABS: 37.5000 mg | ORAL_TABLET | Freq: Every day | ORAL | 0 refills | Status: DC

## 2017-07-26 MED FILL — PHENTERMINE 37.5 MG TABLET: 37.5 | 30 days supply | Qty: 30 | Fill #0

## 2017-07-26 NOTE — Progress Notes (Signed)
Subjective:  I acted as a Education administrator for [employer]. Yancey Flemings, [place]   Patient ID: Katherine Mcfarland, female    DOB: 02-Jun-1985, 32 y.o.   MRN: [id number]  Chief Complaint  Patient presents with  . Weight Loss    HPI  Patient is in today for weight loss. She states she is eating healthier and getting some exercise.   No problems with the medication.   Patient Care Team: Carollee Herter, Alferd Apa, DO as PCP - General (Family Medicine)   Past Medical History:  Diagnosis Date  . Allergy    pt. reports seasonal allergies  . Headache   . Lactose intolerance   . Palpitations   . Right knee pain   . Tinnitus     Past Surgical History:  Procedure Laterality Date  . CESAREAN SECTION    . WISDOM TOOTH EXTRACTION      Family History  Problem Relation Age of Onset  . Diabetes Maternal Grandmother   . Hypertension Maternal Grandmother   . Hypertension Mother   . Hyperlipidemia Mother   . Hypertension Father     Social History   Socioeconomic History  . Marital status: Single    Spouse name: Not on file  . Number of children: Not on file  . Years of education: Not on file  . Highest education level: Not on file  Social Needs  . Financial resource strain: Not on file  . Food insecurity - worry: Not on file  . Food insecurity - inability: Not on file  . Transportation needs - medical: Not on file  . Transportation needs - non-medical: Not on file  Occupational History  . Occupation: CMA    Employer: [employer]  Tobacco Use  . Smoking status: Never Smoker  . Smokeless tobacco: Never Used  Substance and Sexual Activity  . Alcohol use: Yes    Comment: social  . Drug use: No  . Sexual activity: Yes    Birth control/protection: IUD  Other Topics Concern  . Not on file  Social History Narrative  . Not on file    Outpatient Medications Prior to Visit  Medication Sig Dispense Refill  . diclofenac (CATAFLAM) 50 MG tablet Take 1 tablet (50 mg total) by mouth 3 (three)  times daily with meals as needed. 30 tablet 1  . phentermine (ADIPEX-P) 37.5 MG tablet Take 1 tablet (37.5 mg total) by mouth daily before breakfast. 30 tablet 0  . metFORMIN (GLUCOPHAGE) 500 MG tablet Take 1 tablet (500 mg total) by mouth daily after breakfast. (Patient not taking: Reported on 04/24/2017) 30 tablet 0  . amoxicillin (AMOXIL) 500 MG capsule Take 1 capsule (500 mg total) by mouth 3 (three) times daily. 21 capsule 0  . amoxicillin-clavulanate (AUGMENTIN) 875-125 MG tablet Take 1 tablet by mouth 2 (two) times daily. 20 tablet 0  . buPROPion (WELLBUTRIN SR) 150 MG 12 hr tablet Take 1 tablet (150 mg total) by mouth daily. (Patient not taking: Reported on 04/24/2017) 30 tablet 1  . clindamycin (CLEOCIN) 300 MG capsule Take 1 capsule (300 mg total) by mouth 2 (two) times daily. for 7 days 14 capsule 0  . cyclobenzaprine (FLEXERIL) 10 MG tablet Take 1 tablet (10 mg total) by mouth 3 (three) times daily as needed for muscle spasms. 30 tablet 0  . fluticasone (FLONASE) 50 MCG/ACT nasal spray Place 2 sprays into both nostrils daily. (Patient not taking: Reported on 04/24/2017) 16 g 6  . levocetirizine (XYZAL) 5 MG tablet Take  5 mg by mouth every evening. Take 10mg  prn    . Lorcaserin HCl ER (BELVIQ XR) 20 MG TB24 Take 1 tablet by mouth daily after breakfast. 30 tablet 2  . montelukast (SINGULAIR) 10 MG tablet Take 1 tablet by mouth daily.  5  . predniSONE (DELTASONE) 10 MG tablet TAKE 3 TABLETS PO QD FOR 3 DAYS THEN TAKE 2 TABLETS PO QD FOR 3 DAYS THEN TAKE 1 TABLET PO QD FOR 3 DAYS THEN TAKE 1/2 TAB PO QD FOR 3 DAYS 20 tablet 0  . Prenatal Multivit-Min-Fe-FA (PRENATAL VITAMINS) 0.8 MG tablet Take 1 tablet by mouth daily. 30 tablet 12  . SUMAtriptan (IMITREX) 100 MG tablet Take one tablet at onset of headache. May repeat in 2 hours. No more than 2 pills in 24 hours.   Follow-up with me on Monday to see how this is working. 10 tablet 0  . Vitamin D, Ergocalciferol, (DRISDOL) 50000 units CAPS  capsule Take 1 capsule (50,000 Units total) by mouth every 7 (seven) days. (Patient not taking: Reported on 04/24/2017) 4 capsule 0   No facility-administered medications prior to visit.     Allergies  Allergen Reactions  . Metronidazole Nausea And Vomiting  . Mushroom Extract Complex Diarrhea    ROS     Objective:    Physical Exam  Constitutional: She is oriented to person, place, and time. She appears well-developed and well-nourished.  HENT:  Head: Normocephalic and atraumatic.  Eyes: Conjunctivae and EOM are normal.  Neck: Normal range of motion. Neck supple. No JVD present. Carotid bruit is not present. No thyromegaly present.  Cardiovascular: Normal rate, regular rhythm and normal heart sounds.  No murmur heard. Pulmonary/Chest: Effort normal and breath sounds normal. No respiratory distress. She has no wheezes. She has no rales. She exhibits no tenderness.  Musculoskeletal: She exhibits no edema.  Neurological: She is alert and oriented to person, place, and time.  Psychiatric: She has a normal mood and affect.  Nursing note and vitals reviewed.   BP 124/82 (BP Location: Left Arm, Patient Position: Sitting, Cuff Size: Large)   Pulse 99   Temp 98.6 F (37 C) (Oral)   Resp 18   Wt 233 lb (105.7 kg)   LMP 07/19/2017   SpO2 98%   BMI 37.61 kg/m  Wt Readings from Last 3 Encounters:  07/26/17 233 lb (105.7 kg)  06/25/17 242 lb (109.8 kg)  04/24/17 244 lb (110.7 kg)   BP Readings from Last 3 Encounters:  07/26/17 124/82  06/25/17 128/86  06/10/17 120/80     Immunization History  Administered Date(s) Administered  . Influenza-Unspecified 07/13/2015, 02/02/2016  . Td 11/29/2015    Health Maintenance  Topic Date Due  . INFLUENZA VACCINE  12/12/2016  . PAP SMEAR  09/26/2019  . TETANUS/TDAP  11/28/2025  . HIV Screening  Completed    Lab Results  Component Value Date   WBC WILL FOLLOW 11/01/2016   HGB WILL FOLLOW 11/01/2016   HCT WILL FOLLOW 11/01/2016     PLT WILL FOLLOW 11/01/2016   GLUCOSE 114 (H) 09/26/2016   CHOL 171 09/26/2016   TRIG 96 09/26/2016   HDL 45 09/26/2016   LDLCALC 107 (H) 09/26/2016   ALT 19 09/26/2016   AST 20 09/26/2016   NA 140 09/26/2016   K 4.1 09/26/2016   CL 101 09/26/2016   CREATININE 0.85 09/26/2016   BUN 12 09/26/2016   CO2 23 09/26/2016   TSH 1.150 09/26/2016   HGBA1C 6.1 (H) 09/26/2016  Lab Results  Component Value Date   TSH 1.150 09/26/2016   Lab Results  Component Value Date   WBC WILL FOLLOW 11/01/2016   HGB WILL FOLLOW 11/01/2016   HCT WILL FOLLOW 11/01/2016   MCV WILL FOLLOW 11/01/2016   PLT WILL FOLLOW 11/01/2016   Lab Results  Component Value Date   NA 140 09/26/2016   K 4.1 09/26/2016   CO2 23 09/26/2016   GLUCOSE 114 (H) 09/26/2016   BUN 12 09/26/2016   CREATININE 0.85 09/26/2016   BILITOT 0.3 09/26/2016   ALKPHOS 73 09/26/2016   AST 20 09/26/2016   ALT 19 09/26/2016   PROT 7.2 09/26/2016   ALBUMIN 4.0 09/26/2016   CALCIUM 9.4 09/26/2016   GFR 96.74 01/04/2016   Lab Results  Component Value Date   CHOL 171 09/26/2016   Lab Results  Component Value Date   HDL 45 09/26/2016   Lab Results  Component Value Date   LDLCALC 107 (H) 09/26/2016   Lab Results  Component Value Date   TRIG 96 09/26/2016   Lab Results  Component Value Date   CHOLHDL 4 01/04/2016   Lab Results  Component Value Date   HGBA1C 6.1 (H) 09/26/2016         Assessment & Plan:   Problem List Items Addressed This Visit    None    Visit Diagnoses    Morbid obesity ([place])       Relevant Medications   phentermine (ADIPEX-P) 37.5 MG tablet    con't with phenteramine for 2 more months  con't diet and exercise  I have discontinued [employer]'s SUMAtriptan, montelukast, levocetirizine, Prenatal Vitamins, fluticasone, Vitamin D (Ergocalciferol), buPROPion, amoxicillin-clavulanate, amoxicillin, predniSONE, cyclobenzaprine, Lorcaserin HCl ER, and clindamycin. I am also having  her maintain her metFORMIN, diclofenac, and phentermine.  Meds ordered this encounter  Medications  . phentermine (ADIPEX-P) 37.5 MG tablet    Sig: Take 1 tablet (37.5 mg total) by mouth daily before breakfast.    Dispense:  30 tablet    Refill:  0    CMA served as scribe during this visit. History, Physical and Plan performed by medical provider. Documentation and orders reviewed and attested to.  Ann Held, DO

## 2017-07-26 NOTE — Patient Instructions (Signed)
Exercising to Lose Weight Exercising can help you to lose weight. In order to lose weight through exercise, you need to do vigorous-intensity exercise. You can tell that you are exercising with vigorous intensity if you are breathing very hard and fast and cannot hold a conversation while exercising. Moderate-intensity exercise helps to maintain your current weight. You can tell that you are exercising at a moderate level if you have a higher heart rate and faster breathing, but you are still able to hold a conversation. How often should I exercise? Choose an activity that you enjoy and set realistic goals. Your health care provider can help you to make an activity plan that works for you. Exercise regularly as directed by your health care provider. This may include:  Doing resistance training twice each week, such as: ? Push-ups. ? Sit-ups. ? Lifting weights. ? Using resistance bands.  Doing a given intensity of exercise for a given amount of time. Choose from these options: ? 150 minutes of moderate-intensity exercise every week. ? 75 minutes of vigorous-intensity exercise every week. ? A mix of moderate-intensity and vigorous-intensity exercise every week.  Children, pregnant women, people who are out of shape, people who are overweight, and older adults may need to consult a health care provider for individual recommendations. If you have any sort of medical condition, be sure to consult your health care provider before starting a new exercise program. What are some activities that can help me to lose weight?  Walking at a rate of at least 4.5 miles an hour.  Jogging or running at a rate of 5 miles per hour.  Biking at a rate of at least 10 miles per hour.  Lap swimming.  Roller-skating or in-line skating.  Cross-country skiing.  Vigorous competitive sports, such as football, basketball, and soccer.  Jumping rope.  Aerobic dancing. How can I be more active in my day-to-day  activities?  Use the stairs instead of the elevator.  Take a walk during your lunch break.  If you drive, park your car farther away from work or school.  If you take public transportation, get off one stop early and walk the rest of the way.  Make all of your phone calls while standing up and walking around.  Get up, stretch, and walk around every 30 minutes throughout the day. What guidelines should I follow while exercising?  Do not exercise so much that you hurt yourself, feel dizzy, or get very short of breath.  Consult your health care provider prior to starting a new exercise program.  Wear comfortable clothes and shoes with good support.  Drink plenty of water while you exercise to prevent dehydration or heat stroke. Body water is lost during exercise and must be replaced.  Work out until you breathe faster and your heart beats faster. This information is not intended to replace advice given to you by your health care provider. Make sure you discuss any questions you have with your health care provider. Document Released: 06/02/2010 Document Revised: 10/06/2015 Document Reviewed: 10/01/2013 Elsevier Interactive Patient Education  2018 [employer]  

## 2017-08-12 ENCOUNTER — Other Ambulatory Visit: Payer: Self-pay | Admitting: Family Medicine

## 2017-08-12 DIAGNOSIS — Z23 Encounter for immunization: Secondary | ICD-10-CM

## 2017-08-13 ENCOUNTER — Other Ambulatory Visit (INDEPENDENT_AMBULATORY_CARE_PROVIDER_SITE_OTHER): Payer: 59 | Admitting: Family Medicine

## 2017-08-13 ENCOUNTER — Other Ambulatory Visit (INDEPENDENT_AMBULATORY_CARE_PROVIDER_SITE_OTHER): Payer: 59

## 2017-08-13 DIAGNOSIS — Z111 Encounter for screening for respiratory tuberculosis: Secondary | ICD-10-CM | POA: Diagnosis not present

## 2017-08-13 DIAGNOSIS — Z23 Encounter for immunization: Secondary | ICD-10-CM

## 2017-08-13 NOTE — Progress Notes (Signed)
Patient came in for TB skin test

## 2017-08-15 ENCOUNTER — Encounter: Payer: Self-pay | Admitting: *Deleted

## 2017-08-15 LAB — TB SKIN TEST
Induration: 0 mm
TB Skin Test: NEGATIVE

## 2017-08-15 LAB — VARICELLA ZOSTER ANTIBODY, IGG: Varicella IgG: 401.3 index

## 2017-08-19 DIAGNOSIS — Z111 Encounter for screening for respiratory tuberculosis: Secondary | ICD-10-CM

## 2017-08-22 ENCOUNTER — Other Ambulatory Visit: Payer: Self-pay | Admitting: Family Medicine

## 2017-08-22 DIAGNOSIS — Z111 Encounter for screening for respiratory tuberculosis: Secondary | ICD-10-CM

## 2017-08-22 LAB — TB SKIN TEST
INDURATION: 0 mm
TB SKIN TEST: NEGATIVE

## 2017-08-22 NOTE — Progress Notes (Signed)
pp

## 2017-08-23 ENCOUNTER — Encounter: Payer: Self-pay | Admitting: Family Medicine

## 2017-08-23 ENCOUNTER — Ambulatory Visit (INDEPENDENT_AMBULATORY_CARE_PROVIDER_SITE_OTHER): Payer: 59 | Admitting: Family Medicine

## 2017-08-23 MED ORDER — PHENTERMINE HCL 37.5 MG PO TABS: 37.5000 mg | ORAL_TABLET | Freq: Every day | ORAL | 0 refills | Status: DC

## 2017-08-23 MED FILL — PHENTERMINE 37.5 MG TABLET: 37.5 | 30 days supply | Qty: 30 | Fill #0

## 2017-08-23 NOTE — Progress Notes (Signed)
Patient ID: Katherine Mcfarland, female    DOB: Sep 08, 1985  Age: 32 y.o. MRN: [id number]    Subjective:  Subjective  HPI Katherine Mcfarland presents for f/u obesity.  She is cutting down on calories/ food.  She is not exercising.    Review of Systems  Constitutional: Negative for activity change, appetite change, diaphoresis, fatigue and unexpected weight change.  Eyes: Negative for pain, redness and visual disturbance.  Respiratory: Negative for cough, chest tightness, shortness of breath and wheezing.   Cardiovascular: Negative for chest pain, palpitations and leg swelling.  Endocrine: Negative for cold intolerance, heat intolerance, polydipsia, polyphagia and polyuria.  Genitourinary: Negative for difficulty urinating, dysuria and frequency.  Neurological: Negative for dizziness, light-headedness, numbness and headaches.  Psychiatric/Behavioral: Negative for behavioral problems and dysphoric mood. The patient is not nervous/anxious.     History Past Medical History:  Diagnosis Date  . Allergy    pt. reports seasonal allergies  . Headache   . Lactose intolerance   . Palpitations   . Right knee pain   . Tinnitus     She has a past surgical history that includes Cesarean section and Wisdom tooth extraction.   Her family history includes Diabetes in her maternal grandmother; Hyperlipidemia in her mother; Hypertension in her father, maternal grandmother, and mother.She reports that she has never smoked. She has never used smokeless tobacco. She reports that she drinks alcohol. She reports that she does not use drugs.  Current Outpatient Medications on File Prior to Visit  Medication Sig Dispense Refill  . diclofenac (CATAFLAM) 50 MG tablet Take 1 tablet (50 mg total) by mouth 3 (three) times daily with meals as needed. 30 tablet 1  . metFORMIN (GLUCOPHAGE) 500 MG tablet Take 1 tablet (500 mg total) by mouth daily after breakfast. (Patient not taking: Reported on 04/24/2017) 30 tablet  0   No current facility-administered medications on file prior to visit.      Objective:  Objective  Physical Exam  Constitutional: She is oriented to person, place, and time. She appears well-developed and well-nourished.  HENT:  Head: Normocephalic and atraumatic.  Eyes: Conjunctivae and EOM are normal.  Neck: Normal range of motion. Neck supple. No JVD present. Carotid bruit is not present. No thyromegaly present.  Cardiovascular: Normal rate, regular rhythm and normal heart sounds.  No murmur heard. Pulmonary/Chest: Effort normal and breath sounds normal. No respiratory distress. She has no wheezes. She has no rales. She exhibits no tenderness.  Musculoskeletal: She exhibits no edema.  Neurological: She is alert and oriented to person, place, and time.  Psychiatric: She has a normal mood and affect.  Nursing note and vitals reviewed.  BP 126/76 (BP Location: Left Arm, Patient Position: Sitting, Cuff Size: Normal)   Pulse 92   Temp 98 F (36.7 C) (Oral)   Resp 14   Ht 5\' 6"  (1.676 m)   Wt 229 lb (103.9 kg)   LMP 08/11/2017 (Exact Date)   SpO2 98%   BMI 36.96 kg/m  Wt Readings from Last 3 Encounters:  08/23/17 229 lb (103.9 kg)  07/26/17 233 lb (105.7 kg)  06/25/17 242 lb (109.8 kg)     Lab Results  Component Value Date   WBC WILL FOLLOW 11/01/2016   HGB WILL FOLLOW 11/01/2016   HCT WILL FOLLOW 11/01/2016   PLT WILL FOLLOW 11/01/2016   GLUCOSE 114 (H) 09/26/2016   CHOL 171 09/26/2016   TRIG 96 09/26/2016   HDL 45 09/26/2016   LDLCALC 107 (  H) 09/26/2016   ALT 19 09/26/2016   AST 20 09/26/2016   NA 140 09/26/2016   K 4.1 09/26/2016   CL 101 09/26/2016   CREATININE 0.85 09/26/2016   BUN 12 09/26/2016   CO2 23 09/26/2016   TSH 1.150 09/26/2016   HGBA1C 6.1 (H) 09/26/2016    No results found.   Assessment & Plan:  Plan  I am having Katherine Mcfarland maintain her metFORMIN, diclofenac, and phentermine.  Meds ordered this encounter  Medications  .  phentermine (ADIPEX-P) 37.5 MG tablet    Sig: Take 1 tablet (37.5 mg total) by mouth daily before breakfast.    Dispense:  30 tablet    Refill:  0    Problem List Items Addressed This Visit    None    Visit Diagnoses    Morbid obesity ([place])       Relevant Medications   phentermine (ADIPEX-P) 37.5 MG tablet    d/w pt diet and exercise  rx for 1 more month  Follow-up: Return in about 1 month (around 09/20/2017).  Ann Held, DO

## 2017-08-23 NOTE — Patient Instructions (Signed)
Exercising to Lose Weight Exercising can help you to lose weight. In order to lose weight through exercise, you need to do vigorous-intensity exercise. You can tell that you are exercising with vigorous intensity if you are breathing very hard and fast and cannot hold a conversation while exercising. Moderate-intensity exercise helps to maintain your current weight. You can tell that you are exercising at a moderate level if you have a higher heart rate and faster breathing, but you are still able to hold a conversation. How often should I exercise? Choose an activity that you enjoy and set realistic goals. Your health care provider can help you to make an activity plan that works for you. Exercise regularly as directed by your health care provider. This may include:  Doing resistance training twice each week, such as: ? Push-ups. ? Sit-ups. ? Lifting weights. ? Using resistance bands.  Doing a given intensity of exercise for a given amount of time. Choose from these options: ? 150 minutes of moderate-intensity exercise every week. ? 75 minutes of vigorous-intensity exercise every week. ? A mix of moderate-intensity and vigorous-intensity exercise every week.  Children, pregnant women, people who are out of shape, people who are overweight, and older adults may need to consult a health care provider for individual recommendations. If you have any sort of medical condition, be sure to consult your health care provider before starting a new exercise program. What are some activities that can help me to lose weight?  Walking at a rate of at least 4.5 miles an hour.  Jogging or running at a rate of 5 miles per hour.  Biking at a rate of at least 10 miles per hour.  Lap swimming.  Roller-skating or in-line skating.  Cross-country skiing.  Vigorous competitive sports, such as football, basketball, and soccer.  Jumping rope.  Aerobic dancing. How can I be more active in my day-to-day  activities?  Use the stairs instead of the elevator.  Take a walk during your lunch break.  If you drive, park your car farther away from work or school.  If you take public transportation, get off one stop early and walk the rest of the way.  Make all of your phone calls while standing up and walking around.  Get up, stretch, and walk around every 30 minutes throughout the day. What guidelines should I follow while exercising?  Do not exercise so much that you hurt yourself, feel dizzy, or get very short of breath.  Consult your health care provider prior to starting a new exercise program.  Wear comfortable clothes and shoes with good support.  Drink plenty of water while you exercise to prevent dehydration or heat stroke. Body water is lost during exercise and must be replaced.  Work out until you breathe faster and your heart beats faster. This information is not intended to replace advice given to you by your health care provider. Make sure you discuss any questions you have with your health care provider. Document Released: 06/02/2010 Document Revised: 10/06/2015 Document Reviewed: 10/01/2013 Elsevier Interactive Patient Education  2018 [employer]  

## 2017-08-23 NOTE — Progress Notes (Signed)
Pre visit review using our clinic review tool, if applicable. No additional management support is needed unless otherwise documented below in the visit note. 

## 2017-09-06 ENCOUNTER — Telehealth: Payer: Self-pay

## 2017-09-06 ENCOUNTER — Other Ambulatory Visit (INDEPENDENT_AMBULATORY_CARE_PROVIDER_SITE_OTHER): Payer: 59

## 2017-09-06 DIAGNOSIS — N926 Irregular menstruation, unspecified: Secondary | ICD-10-CM

## 2017-09-06 LAB — HCG, QUANTITATIVE, PREGNANCY: Quantitative HCG: 0.26 m[IU]/mL

## 2017-09-06 NOTE — Telephone Encounter (Signed)
Lab orders placed, patient notified of negative results.

## 2017-09-06 NOTE — Telephone Encounter (Signed)
ok 

## 2017-09-06 NOTE — Telephone Encounter (Signed)
Patient would like STAT hcg test due to missed period two weeks. I have ordered HCG test.

## 2017-09-17 ENCOUNTER — Encounter: Payer: Self-pay | Admitting: Obstetrics & Gynecology

## 2017-09-30 ENCOUNTER — Ambulatory Visit (INDEPENDENT_AMBULATORY_CARE_PROVIDER_SITE_OTHER): Payer: 59 | Admitting: Family Medicine

## 2017-09-30 ENCOUNTER — Encounter: Payer: Self-pay | Admitting: Family Medicine

## 2017-09-30 VITALS — BP 122/78 | HR 118 | Temp 98.9°F | Ht 66.0 in | Wt 234.0 lb

## 2017-09-30 DIAGNOSIS — M5441 Lumbago with sciatica, right side: Secondary | ICD-10-CM

## 2017-09-30 MED ORDER — PREDNISONE 10 MG PO TABS: ORAL_TABLET | ORAL | 0 refills | Status: DC

## 2017-09-30 MED ORDER — PREDNISONE 10 MG PO TABS
ORAL_TABLET | ORAL | 0 refills | Status: DC
Start: 2017-09-30 — End: 2017-09-30

## 2017-09-30 MED FILL — predniSONE 10 MG TABS: 10 | 12 days supply | Qty: 20 | Fill #0

## 2017-09-30 NOTE — Progress Notes (Signed)
Patient ID: Katherine Mcfarland, female    DOB: August 02, 1985  Age: 32 y.o. MRN: [id number]    Subjective:  Subjective  HPI Katherine Mcfarland presents for c/o low back pain x 1 week.  No known injury.  Pain is in R hip and radiates down r leg.  No bowel or bladder incontinence.  She has been using IB and flexeril with little relief.    Review of Systems  Constitutional: Negative for chills and fever.  HENT: Negative for congestion and hearing loss.   Eyes: Negative for discharge.  Respiratory: Negative for cough and shortness of breath.   Cardiovascular: Negative for chest pain, palpitations and leg swelling.  Gastrointestinal: Negative for abdominal pain, blood in stool, constipation, diarrhea, nausea and vomiting.  Genitourinary: Negative for dysuria, frequency, hematuria and urgency.  Musculoskeletal: Positive for back pain. Negative for myalgias.  Skin: Negative for rash.  Allergic/Immunologic: Negative for environmental allergies.  Neurological: Negative for dizziness, weakness and headaches.  Hematological: Does not bruise/bleed easily.  Psychiatric/Behavioral: Negative for suicidal ideas. The patient is not nervous/anxious.     History Past Medical History:  Diagnosis Date  . Allergy    pt. reports seasonal allergies  . Headache   . Lactose intolerance   . Palpitations   . Right knee pain   . Tinnitus     She has a past surgical history that includes Cesarean section and Wisdom tooth extraction.   Her family history includes Diabetes in her maternal grandmother; Hyperlipidemia in her mother; Hypertension in her father, maternal grandmother, and mother.She reports that she has never smoked. She has never used smokeless tobacco. She reports that she drinks alcohol. She reports that she does not use drugs.  Current Outpatient Medications on File Prior to Visit  Medication Sig Dispense Refill  . diclofenac (CATAFLAM) 50 MG tablet Take 1 tablet (50 mg total) by mouth 3 (three)  times daily with meals as needed. 30 tablet 1  . metFORMIN (GLUCOPHAGE) 500 MG tablet Take 1 tablet (500 mg total) by mouth daily after breakfast. (Patient not taking: Reported on 04/24/2017) 30 tablet 0  . phentermine (ADIPEX-P) 37.5 MG tablet Take 1 tablet (37.5 mg total) by mouth daily before breakfast. 30 tablet 0   No current facility-administered medications on file prior to visit.      Objective:  Objective  Physical Exam  Constitutional: She is oriented to person, place, and time. She appears well-developed and well-nourished.  HENT:  Head: Normocephalic and atraumatic.  Eyes: Conjunctivae and EOM are normal.  Neck: Normal range of motion. Neck supple. No JVD present. Carotid bruit is not present. No thyromegaly present.  Cardiovascular: Normal rate, regular rhythm and normal heart sounds.  No murmur heard. Pulmonary/Chest: Effort normal and breath sounds normal. No respiratory distress. She has no wheezes. She has no rales. She exhibits no tenderness.  Musculoskeletal: She exhibits tenderness. She exhibits no edema.  Neurological: She is alert and oriented to person, place, and time. She displays normal reflexes.  + 3/5 strength R hip flex and knee extension + slr R 45 degrees + slr 60 degrees   Psychiatric: She has a normal mood and affect.  Nursing note and vitals reviewed.  BP 122/78 (BP Location: Left Arm, Cuff Size: Large)   Pulse (!) 118   Temp 98.9 F (37.2 C) (Oral)   Ht 5\' 6"  (1.676 m)   Wt 234 lb (106.1 kg)   LMP 09/08/2017   SpO2 97%   BMI 37.77 kg/m  Wt Readings from Last 3 Encounters:  09/30/17 234 lb (106.1 kg)  08/23/17 229 lb (103.9 kg)  07/26/17 233 lb (105.7 kg)     Lab Results  Component Value Date   WBC WILL FOLLOW 11/01/2016   HGB WILL FOLLOW 11/01/2016   HCT WILL FOLLOW 11/01/2016   PLT WILL FOLLOW 11/01/2016   GLUCOSE 114 (H) 09/26/2016   CHOL 171 09/26/2016   TRIG 96 09/26/2016   HDL 45 09/26/2016   LDLCALC 107 (H) 09/26/2016    ALT 19 09/26/2016   AST 20 09/26/2016   NA 140 09/26/2016   K 4.1 09/26/2016   CL 101 09/26/2016   CREATININE 0.85 09/26/2016   BUN 12 09/26/2016   CO2 23 09/26/2016   TSH 1.150 09/26/2016   HGBA1C 6.1 (H) 09/26/2016    No results found.   Assessment & Plan:  Plan  I am having [facility] maintain her metFORMIN, diclofenac, phentermine, and predniSONE.  Meds ordered this encounter  Medications  . DISCONTD: predniSONE (DELTASONE) 10 MG tablet    Sig: TAKE 3 TABLETS PO QD FOR 3 DAYS THEN TAKE 2 TABLETS PO QD FOR 3 DAYS THEN TAKE 1 TABLET PO QD FOR 3 DAYS THEN TAKE 1/2 TAB PO QD FOR 3 DAYS    Dispense:  20 tablet    Refill:  0  . predniSONE (DELTASONE) 10 MG tablet    Sig: TAKE 3 TABLETS PO QD FOR 3 DAYS THEN TAKE 2 TABLETS PO QD FOR 3 DAYS THEN TAKE 1 TABLET PO QD FOR 3 DAYS THEN TAKE 1/2 TAB PO QD FOR 3 DAYS    Dispense:  20 tablet    Refill:  0    Problem List Items Addressed This Visit    None    Visit Diagnoses    Acute right-sided low back pain with right-sided sciatica    -  Primary   Relevant Medications   predniSONE (DELTASONE) 10 MG tablet   Other Relevant Orders   DG Lumbar Spine Complete    pt has muscle relaxers at home R/u prn  Follow-up: Return if symptoms worsen or fail to improve.  Ann Held, DO

## 2017-09-30 NOTE — Patient Instructions (Signed)

## 2017-10-02 ENCOUNTER — Ambulatory Visit: Payer: 59 | Admitting: Obstetrics & Gynecology

## 2017-10-02 ENCOUNTER — Encounter: Payer: Self-pay | Admitting: Obstetrics & Gynecology

## 2017-10-02 ENCOUNTER — Ambulatory Visit (HOSPITAL_BASED_OUTPATIENT_CLINIC_OR_DEPARTMENT_OTHER)
Admission: RE | Admit: 2017-10-02 | Discharge: 2017-10-02 | Disposition: A | Discharge status: Home / Self Care | Payer: 59 | Source: Ambulatory Visit | Attending: Family Medicine | Admitting: Family Medicine

## 2017-10-02 VITALS — BP 130/76 | HR 93 | Ht 66.0 in | Wt 239.8 lb

## 2017-10-02 DIAGNOSIS — M545 Low back pain: Secondary | ICD-10-CM | POA: Diagnosis not present

## 2017-10-02 DIAGNOSIS — M5441 Lumbago with sciatica, right side: Secondary | ICD-10-CM | POA: Diagnosis not present

## 2017-10-02 DIAGNOSIS — N949 Unspecified condition associated with female genital organs and menstrual cycle: Secondary | ICD-10-CM

## 2017-10-02 NOTE — Progress Notes (Signed)
History:  32 y.o. [id number] here today for eval of lump in vagina. Pt is concerned about pelvic organ prolapse. She reprots that she was having pain with intercourse when she felt a lump from her introitus. She denied having felt that is prev. Pt is s/p SVD x3 and c/ds x 1. She denies leakage of urine or other related sx. She denies pain at present. She reports that her partner looked and saw what looked like a 'heartbeat'.    The following portions of the patient's history were reviewed and updated as appropriate: allergies, current medications, past family history, past medical history, past social history, past surgical history and problem list.  Review of Systems:  Pertinent items are noted in HPI.    Objective:  Physical Exam Blood pressure 130/76, pulse 93, height 5\' 6"  (1.676 m), weight 239 lb 12.8 oz (108.8 kg), last menstrual period 09/08/2017.  CONSTITUTIONAL: Well-developed, well-nourished female in no acute distress.  HENT:  Normocephalic, atraumatic EYES: Conjunctivae and EOM are normal. No scleral icterus.  NECK: Normal range of motion SKIN: Skin is warm and dry. No rash noted. Not diaphoretic.No pallor. [place]: Alert and oriented to person, place, and time. Normal coordination.  Pelvic: Normal appearing external genitalia; normal appearing vaginal mucosa and cervix.  Normal discharge.  Small uterus, no other palpable masses, no uterine or adnexal tenderness. The pubococcygeal muscles is strong and contracts well   Assessment & Plan:  Vaginal lump- normal exam with no evidence of prolapse.  Pt educated on vulvar anatomy  Total face-to-face time with patient was 15 min.  Greater than 50% was spent in counseling and coordination of care with the patient.  Cyana Shook L. Harraway-Smith, M.D., [place]lynn June

## 2017-10-18 ENCOUNTER — Encounter: Payer: Self-pay | Admitting: Family Medicine

## 2017-10-18 ENCOUNTER — Ambulatory Visit (INDEPENDENT_AMBULATORY_CARE_PROVIDER_SITE_OTHER): Payer: 59 | Admitting: Family Medicine

## 2017-10-18 VITALS — BP 137/84 | HR 98 | Temp 98.1°F | Resp 16 | Ht 66.0 in | Wt 235.0 lb

## 2017-10-18 DIAGNOSIS — Z0000 Encounter for general adult medical examination without abnormal findings: Secondary | ICD-10-CM

## 2017-10-18 DIAGNOSIS — R739 Hyperglycemia, unspecified: Secondary | ICD-10-CM | POA: Diagnosis not present

## 2017-10-18 DIAGNOSIS — E669 Obesity, unspecified: Secondary | ICD-10-CM

## 2017-10-18 DIAGNOSIS — E559 Vitamin D deficiency, unspecified: Secondary | ICD-10-CM | POA: Diagnosis not present

## 2017-10-18 DIAGNOSIS — Z6837 Body mass index (BMI) 37.0-37.9, adult: Secondary | ICD-10-CM | POA: Diagnosis not present

## 2017-10-18 DIAGNOSIS — Z3169 Encounter for other general counseling and advice on procreation: Secondary | ICD-10-CM | POA: Insufficient documentation

## 2017-10-18 MED ORDER — PHENTERMINE HCL 37.5 MG PO TABS: 37.5000 mg | ORAL_TABLET | Freq: Every day | ORAL | 0 refills | Status: DC

## 2017-10-18 MED FILL — PHENTERMINE 37.5 MG TABLET: 37.5 | 30 days supply | Qty: 30 | Fill #0

## 2017-10-18 NOTE — Assessment & Plan Note (Signed)
ghm utd Check labs  See avs  

## 2017-10-18 NOTE — Assessment & Plan Note (Signed)
Pt to restart phentermine Diet and exercise discussed rto 1 mon

## 2017-10-18 NOTE — Patient Instructions (Signed)
Preventive Care 18-39 Years, Female Preventive care refers to lifestyle choices and visits with your health care provider that can promote health and wellness. What does preventive care include?  A yearly physical exam. This is also called an annual well check.  Dental exams once or twice a year.  Routine eye exams. Ask your health care provider how often you should have your eyes checked.  Personal lifestyle choices, including: ? Daily care of your teeth and gums. ? Regular physical activity. ? Eating a healthy diet. ? Avoiding tobacco and drug use. ? Limiting alcohol use. ? Practicing safe sex. ? Taking vitamin and mineral supplements as recommended by your health care provider. What happens during an annual well check? The services and screenings done by your health care provider during your annual well check will depend on your age, overall health, lifestyle risk factors, and family history of disease. Counseling Your health care provider may ask you questions about your:  Alcohol use.  Tobacco use.  Drug use.  Emotional well-being.  Home and relationship well-being.  Sexual activity.  Eating habits.  Work and work Statistician.  Method of birth control.  Menstrual cycle.  Pregnancy history.  Screening You may have the following tests or measurements:  Height, weight, and BMI.  Diabetes screening. This is done by checking your blood sugar (glucose) after you have not eaten for a while (fasting).  Blood pressure.  Lipid and cholesterol levels. These may be checked every 5 years starting at age 66.  Skin check.  Hepatitis C blood test.  Hepatitis B blood test.  Sexually transmitted disease (STD) testing.  BRCA-related cancer screening. This may be done if you have a family history of breast, ovarian, tubal, or peritoneal cancers.  Pelvic exam and Pap test. This may be done every 3 years starting at age 40. Starting at age 59, this may be done every 5  years if you have a Pap test in combination with an HPV test.  Discuss your test results, treatment options, and if necessary, the need for more tests with your health care provider. Vaccines Your health care provider may recommend certain vaccines, such as:  Influenza vaccine. This is recommended every year.  Tetanus, diphtheria, and acellular pertussis (Tdap, Td) vaccine. You may need a Td booster every 10 years.  Varicella vaccine. You may need this if you have not been vaccinated.  HPV vaccine. If you are 69 or younger, you may need three doses over 6 months.  Measles, mumps, and rubella (MMR) vaccine. You may need at least one dose of MMR. You may also need a second dose.  Pneumococcal 13-valent conjugate (PCV13) vaccine. You may need this if you have certain conditions and were not previously vaccinated.  Pneumococcal polysaccharide (PPSV23) vaccine. You may need one or two doses if you smoke cigarettes or if you have certain conditions.  Meningococcal vaccine. One dose is recommended if you are age 27-21 years and a first-year college student living in a residence hall, or if you have one of several medical conditions. You may also need additional booster doses.  Hepatitis A vaccine. You may need this if you have certain conditions or if you travel or work in places where you may be exposed to hepatitis A.  Hepatitis B vaccine. You may need this if you have certain conditions or if you travel or work in places where you may be exposed to hepatitis B.  Haemophilus influenzae type b (Hib) vaccine. You may need this if  you have certain risk factors.  Talk to your health care provider about which screenings and vaccines you need and how often you need them. This information is not intended to replace advice given to you by your health care provider. Make sure you discuss any questions you have with your health care provider. Document Released: 06/26/2001 Document Revised: 01/18/2016  Document Reviewed: 03/01/2015 Elsevier Interactive Patient Education  [employer].

## 2017-10-18 NOTE — Progress Notes (Addendum)
Subjective:  I acted as a Education administrator for [employer]. Yancey Flemings, [place]   Patient ID: Katherine Mcfarland, female    DOB: 07-24-1985, 32 y.o.   MRN: [id number]  Chief Complaint  Patient presents with  . Annual Exam     HPI  Patient is in today for annual exam. No complaints Pt would like to go back on the phentermine for a few months.    Patient Care Team: Carollee Herter, Alferd Apa, DO as PCP - General (Family Medicine) Lavonia Drafts, MD as Consulting Physician (Obstetrics and Gynecology)   Past Medical History:  Diagnosis Date  . Allergy    pt. reports seasonal allergies  . Headache   . Lactose intolerance   . Palpitations   . Right knee pain   . Tinnitus     Past Surgical History:  Procedure Laterality Date  . CESAREAN SECTION    . WISDOM TOOTH EXTRACTION      Family History  Problem Relation Age of Onset  . Diabetes Maternal Grandmother   . Hypertension Maternal Grandmother   . Hypertension Mother   . Hyperlipidemia Mother   . Hypertension Father     Social History   Socioeconomic History  . Marital status: Single    Spouse name: Not on file  . Number of children: Not on file  . Years of education: Not on file  . Highest education level: Not on file  Occupational History  . Occupation: CMA    Employer: [employer]  . Financial resource strain: Not on file  . Food insecurity:    Worry: Not on file    Inability: Not on file  . Transportation needs:    Medical: Not on file    Non-medical: Not on file  Tobacco Use  . Smoking status: Never Smoker  . Smokeless tobacco: Never Used  Substance and Sexual Activity  . Alcohol use: Yes    Comment: social  . Drug use: No  . Sexual activity: Yes    Birth control/protection: IUD  Lifestyle  . Physical activity:    Days per week: Not on file    Minutes per session: Not on file  . Stress: Not on file  Relationships  . Social connections:    Talks on phone: Not on file    Gets together:  Not on file    Attends religious service: Not on file    Active member of club or organization: Not on file    Attends meetings of clubs or organizations: Not on file    Relationship status: Not on file  . Intimate partner violence:    Fear of current or ex partner: Not on file    Emotionally abused: Not on file    Physically abused: Not on file    Forced sexual activity: Not on file  Other Topics Concern  . Not on file  Social History Narrative  . Not on file    Outpatient Medications Prior to Visit  Medication Sig Dispense Refill  . diclofenac (CATAFLAM) 50 MG tablet Take 1 tablet (50 mg total) by mouth 3 (three) times daily with meals as needed. (Patient not taking: Reported on 10/02/2017) 30 tablet 1  . metFORMIN (GLUCOPHAGE) 500 MG tablet Take 1 tablet (500 mg total) by mouth daily after breakfast. (Patient not taking: Reported on 04/24/2017) 30 tablet 0  . predniSONE (DELTASONE) 10 MG tablet TAKE 3 TABLETS PO QD FOR 3 DAYS THEN TAKE 2 TABLETS PO QD FOR 3  DAYS THEN TAKE 1 TABLET PO QD FOR 3 DAYS THEN TAKE 1/2 TAB PO QD FOR 3 DAYS 20 tablet 0   No facility-administered medications prior to visit.     Allergies  Allergen Reactions  . Metronidazole Nausea And Vomiting  . Mushroom Extract Complex Diarrhea    Review of Systems  Constitutional: Negative for chills, fever and malaise/fatigue.  HENT: Negative for congestion and hearing loss.   Eyes: Negative for blurred vision and discharge.  Respiratory: Negative for cough, sputum production and shortness of breath.   Cardiovascular: Negative for chest pain, palpitations and leg swelling.  Gastrointestinal: Negative for abdominal pain, blood in stool, constipation, diarrhea, heartburn, nausea and vomiting.  Genitourinary: Negative for dysuria, frequency, hematuria and urgency.  Musculoskeletal: Negative for back pain, falls and myalgias.  Skin: Negative for rash.  Neurological: Negative for dizziness, sensory change, loss of  consciousness, weakness and headaches.  Endo/Heme/Allergies: Negative for environmental allergies. Does not bruise/bleed easily.  Psychiatric/Behavioral: Negative for depression and suicidal ideas. The patient is not nervous/anxious and does not have insomnia.        Objective:    Physical Exam  Constitutional: She is oriented to person, place, and time. She appears well-developed and well-nourished. No distress.  HENT:  Right Ear: External ear normal.  Left Ear: External ear normal.  Nose: Nose normal.  Mouth/Throat: Oropharynx is clear and moist.  Eyes: Pupils are equal, round, and reactive to light. EOM are normal.  Neck: Normal range of motion. Neck supple.  Cardiovascular: Normal rate, regular rhythm and normal heart sounds.  No murmur heard. Pulmonary/Chest: Effort normal and breath sounds normal. No respiratory distress. She has no wheezes. She has no rales. She exhibits no tenderness.  Genitourinary:  Genitourinary Comments: Deferred -- gyn  Neurological: She is alert and oriented to person, place, and time.  Psychiatric: She has a normal mood and affect. Her behavior is normal. Judgment and thought content normal.  Nursing note and vitals reviewed.   BP 137/84 (BP Location: Left Arm, Patient Position: Sitting, Cuff Size: Large)   Pulse 98   Temp 98.1 F (36.7 C) (Oral)   Resp 16   Ht 5\' 6"  (1.676 m)   Wt 235 lb (106.6 kg)   SpO2 100%   BMI 37.93 kg/m  Wt Readings from Last 3 Encounters:  10/18/17 235 lb (106.6 kg)  10/02/17 239 lb 12.8 oz (108.8 kg)  09/30/17 234 lb (106.1 kg)   BP Readings from Last 3 Encounters:  10/18/17 137/84  10/02/17 130/76  09/30/17 122/78     Immunization History  Administered Date(s) Administered  . Influenza-Unspecified 07/13/2015, 02/02/2016  . PPD Test 08/19/2017  . Td 11/29/2015    Health Maintenance  Topic Date Due  . INFLUENZA VACCINE  12/12/2017  . PAP SMEAR  09/26/2019  . TETANUS/TDAP  11/28/2025  . HIV Screening   Completed    Lab Results  Component Value Date   WBC WILL FOLLOW 11/01/2016   HGB WILL FOLLOW 11/01/2016   HCT WILL FOLLOW 11/01/2016   PLT WILL FOLLOW 11/01/2016   GLUCOSE 114 (H) 09/26/2016   CHOL 171 09/26/2016   TRIG 96 09/26/2016   HDL 45 09/26/2016   LDLCALC 107 (H) 09/26/2016   ALT 19 09/26/2016   AST 20 09/26/2016   NA 140 09/26/2016   K 4.1 09/26/2016   CL 101 09/26/2016   CREATININE 0.85 09/26/2016   BUN 12 09/26/2016   CO2 23 09/26/2016   TSH 1.150 09/26/2016  HGBA1C 6.1 (H) 09/26/2016    Lab Results  Component Value Date   TSH 1.150 09/26/2016   Lab Results  Component Value Date   WBC WILL FOLLOW 11/01/2016   HGB WILL FOLLOW 11/01/2016   HCT WILL FOLLOW 11/01/2016   MCV WILL FOLLOW 11/01/2016   PLT WILL FOLLOW 11/01/2016   Lab Results  Component Value Date   NA 140 09/26/2016   K 4.1 09/26/2016   CO2 23 09/26/2016   GLUCOSE 114 (H) 09/26/2016   BUN 12 09/26/2016   CREATININE 0.85 09/26/2016   BILITOT 0.3 09/26/2016   ALKPHOS 73 09/26/2016   AST 20 09/26/2016   ALT 19 09/26/2016   PROT 7.2 09/26/2016   ALBUMIN 4.0 09/26/2016   CALCIUM 9.4 09/26/2016   GFR 96.74 01/04/2016   Lab Results  Component Value Date   CHOL 171 09/26/2016   Lab Results  Component Value Date   HDL 45 09/26/2016   Lab Results  Component Value Date   LDLCALC 107 (H) 09/26/2016   Lab Results  Component Value Date   TRIG 96 09/26/2016   Lab Results  Component Value Date   CHOLHDL 4 01/04/2016   Lab Results  Component Value Date   HGBA1C 6.1 (H) 09/26/2016         Assessment & Plan:   Problem List Items Addressed This Visit      Unprioritized   Class 2 obesity without serious comorbidity with body mass index (BMI) of 37.0 to 37.9 in adult    Pt to restart phentermine Diet and exercise discussed rto 1 mon      Relevant Medications   phentermine (ADIPEX-P) 37.5 MG tablet   Morbid obesity (HCC)   Relevant Medications   phentermine (ADIPEX-P)  37.5 MG tablet   Preventative health care - Primary    ghm utd Check labs  See avs      Relevant Orders   CBC with Differential/Platelet   Lipid panel   TSH   Comprehensive metabolic panel   Hemoglobin A1c   Vitamin D deficiency   Relevant Orders   Vitamin D (25 hydroxy)    Other Visit Diagnoses    Hyperglycemia       Relevant Orders   Hemoglobin A1c      I am having Katherine Mcfarland maintain her metFORMIN, diclofenac, predniSONE, and phentermine.  Meds ordered this encounter  Medications  . phentermine (ADIPEX-P) 37.5 MG tablet    Sig: Take 1 tablet (37.5 mg total) by mouth daily before breakfast.    Dispense:  30 tablet    Refill:  0    CMA served as scribe during this visit. History, Physical and Plan performed by medical provider. Documentation and orders reviewed and attested to.  Ann Held, DO

## 2017-10-19 LAB — COMPREHENSIVE METABOLIC PANEL
AG RATIO: 1.4 (calc) (ref 1.0–2.5)
ALBUMIN MSPROF: 4.3 g/dL (ref 3.6–5.1)
ALT: 12 U/L (ref 6–29)
AST: 17 U/L (ref 10–30)
Alkaline phosphatase (APISO): 67 U/L (ref 33–115)
BILIRUBIN TOTAL: 0.2 mg/dL (ref 0.2–1.2)
BUN: 13 mg/dL (ref 7–25)
CALCIUM: 9.3 mg/dL (ref 8.6–10.2)
CHLORIDE: 101 mmol/L (ref 98–110)
CO2: 24 mmol/L (ref 20–32)
Creat: 0.83 mg/dL (ref 0.50–1.10)
GLOBULIN: 3.1 g/dL (ref 1.9–3.7)
GLUCOSE: 100 mg/dL — AB (ref 65–99)
POTASSIUM: 4.2 mmol/L (ref 3.5–5.3)
SODIUM: 137 mmol/L (ref 135–146)
TOTAL PROTEIN: 7.4 g/dL (ref 6.1–8.1)

## 2017-10-19 LAB — LIPID PANEL
CHOL/HDL RATIO: 3.9 (calc) (ref <5.0)
CHOLESTEROL: 178 mg/dL (ref <200)
HDL: 46 mg/dL — AB (ref >50)
LDL Cholesterol (Calc): 109 mg/dL (calc) — ABNORMAL HIGH
Non-HDL Cholesterol (Calc): 132 mg/dL (calc) — ABNORMAL HIGH (ref <130)
Triglycerides: 120 mg/dL (ref <150)

## 2017-10-19 LAB — CBC WITH DIFFERENTIAL/PLATELET
BASOS ABS: 20 {cells}/uL (ref 0–200)
Basophils Relative: 0.3 %
EOS PCT: 2.6 %
Eosinophils Absolute: 177 cells/uL (ref 15–500)
HCT: 34.9 % — ABNORMAL LOW (ref 35.0–45.0)
HEMOGLOBIN: 11.4 g/dL — AB (ref 11.7–15.5)
Lymphs Abs: 3203 cells/uL (ref 850–3900)
MCH: 26.5 pg — ABNORMAL LOW (ref 27.0–33.0)
MCHC: 32.7 g/dL (ref 32.0–36.0)
MCV: 81.2 fL (ref 80.0–100.0)
MONOS PCT: 6.8 %
MPV: 10.9 fL (ref 7.5–12.5)
NEUTROS ABS: 2938 {cells}/uL (ref 1500–7800)
Neutrophils Relative %: 43.2 %
PLATELETS: 328 10*3/uL (ref 140–400)
RBC: 4.3 10*6/uL (ref 3.80–5.10)
RDW: 13.4 % (ref 11.0–15.0)
TOTAL LYMPHOCYTE: 47.1 %
WBC mixed population: 462 cells/uL (ref 200–950)
WBC: 6.8 10*3/uL (ref 3.8–10.8)

## 2017-10-19 LAB — VITAMIN D 25 HYDROXY (VIT D DEFICIENCY, FRACTURES): Vit D, 25-Hydroxy: 17 ng/mL — ABNORMAL LOW (ref 30–100)

## 2017-10-19 LAB — HEMOGLOBIN A1C
Hgb A1c MFr Bld: 6.1 % of total Hgb — ABNORMAL HIGH (ref <5.7)
Mean Plasma Glucose: 128 (calc)
eAG (mmol/L): 7.1 (calc)

## 2017-10-19 LAB — TSH: TSH: 1.39 mIU/L

## 2017-10-21 ENCOUNTER — Other Ambulatory Visit: Payer: Self-pay | Admitting: Family Medicine

## 2017-10-21 DIAGNOSIS — E559 Vitamin D deficiency, unspecified: Secondary | ICD-10-CM

## 2017-10-21 MED ORDER — VITAMIN D (ERGOCALCIFEROL) 1.25 MG (50000 UNIT) PO CAPS: 50000.0000 [IU] | ORAL_CAPSULE | ORAL | 3 refills | Status: DC

## 2017-10-21 MED FILL — VIT D2 1.25 MG (50,000 UNIT: 1.25 MG | 84 days supply | Qty: 12 | Fill #0

## 2017-10-22 ENCOUNTER — Encounter: Payer: Self-pay | Admitting: *Deleted

## 2017-10-23 ENCOUNTER — Ambulatory Visit (INDEPENDENT_AMBULATORY_CARE_PROVIDER_SITE_OTHER): Payer: 59 | Admitting: Family Medicine

## 2017-10-23 ENCOUNTER — Encounter: Payer: Self-pay | Admitting: Family Medicine

## 2017-10-23 VITALS — BP 120/77 | HR 92 | Ht 66.0 in | Wt 236.8 lb

## 2017-10-23 DIAGNOSIS — Z01419 Encounter for gynecological examination (general) (routine) without abnormal findings: Secondary | ICD-10-CM

## 2017-10-23 DIAGNOSIS — Z124 Encounter for screening for malignant neoplasm of cervix: Secondary | ICD-10-CM | POA: Diagnosis not present

## 2017-10-23 DIAGNOSIS — N898 Other specified noninflammatory disorders of vagina: Secondary | ICD-10-CM

## 2017-10-23 DIAGNOSIS — Z113 Encounter for screening for infections with a predominantly sexual mode of transmission: Secondary | ICD-10-CM

## 2017-10-23 DIAGNOSIS — Z1151 Encounter for screening for human papillomavirus (HPV): Secondary | ICD-10-CM | POA: Diagnosis not present

## 2017-10-23 DIAGNOSIS — Z3169 Encounter for other general counseling and advice on procreation: Secondary | ICD-10-CM

## 2017-10-23 DIAGNOSIS — E559 Vitamin D deficiency, unspecified: Secondary | ICD-10-CM

## 2017-10-23 NOTE — Patient Instructions (Addendum)
Preventive Care 18-39 Years, Female Preventive care refers to lifestyle choices and visits with your health care provider that can promote health and wellness. What does preventive care include?  A yearly physical exam. This is also called an annual well check.  Dental exams once or twice a year.  Routine eye exams. Ask your health care provider how often you should have your eyes checked.  Personal lifestyle choices, including: ? Daily care of your teeth and gums. ? Regular physical activity. ? Eating a healthy diet. ? Avoiding tobacco and drug use. ? Limiting alcohol use. ? Practicing safe sex. ? Taking vitamin and mineral supplements as recommended by your health care provider. What happens during an annual well check? The services and screenings done by your health care provider during your annual well check will depend on your age, overall health, lifestyle risk factors, and family history of disease. Counseling Your health care provider may ask you questions about your:  Alcohol use.  Tobacco use.  Drug use.  Emotional well-being.  Home and relationship well-being.  Sexual activity.  Eating habits.  Work and work Statistician.  Method of birth control.  Menstrual cycle.  Pregnancy history.  Screening You may have the following tests or measurements:  Height, weight, and BMI.  Diabetes screening. This is done by checking your blood sugar (glucose) after you have not eaten for a while (fasting).  Blood pressure.  Lipid and cholesterol levels. These may be checked every 5 years starting at age 66.  Skin check.  Hepatitis C blood test.  Hepatitis B blood test.  Sexually transmitted disease (STD) testing.  BRCA-related cancer screening. This may be done if you have a family history of breast, ovarian, tubal, or peritoneal cancers.  Pelvic exam and Pap test. This may be done every 3 years starting at age 40. Starting at age 59, this may be done every 5  years if you have a Pap test in combination with an HPV test.  Discuss your test results, treatment options, and if necessary, the need for more tests with your health care provider. Vaccines Your health care provider may recommend certain vaccines, such as:  Influenza vaccine. This is recommended every year.  Tetanus, diphtheria, and acellular pertussis (Tdap, Td) vaccine. You may need a Td booster every 10 years.  Varicella vaccine. You may need this if you have not been vaccinated.  HPV vaccine. If you are 69 or younger, you may need three doses over 6 months.  Measles, mumps, and rubella (MMR) vaccine. You may need at least one dose of MMR. You may also need a second dose.  Pneumococcal 13-valent conjugate (PCV13) vaccine. You may need this if you have certain conditions and were not previously vaccinated.  Pneumococcal polysaccharide (PPSV23) vaccine. You may need one or two doses if you smoke cigarettes or if you have certain conditions.  Meningococcal vaccine. One dose is recommended if you are age 27-21 years and a first-year college student living in a residence hall, or if you have one of several medical conditions. You may also need additional booster doses.  Hepatitis A vaccine. You may need this if you have certain conditions or if you travel or work in places where you may be exposed to hepatitis A.  Hepatitis B vaccine. You may need this if you have certain conditions or if you travel or work in places where you may be exposed to hepatitis B.  Haemophilus influenzae type b (Hib) vaccine. You may need this if  if you have certain risk factors.  Talk to your health care provider about which screenings and vaccines you need and how often you need them. This information is not intended to replace advice given to you by your health care provider. Make sure you discuss any questions you have with your health care provider. Document Released: 06/26/2001 Document Revised:  01/18/2016 Document Reviewed: 03/01/2015 Elsevier Interactive Patient Education  2018 [employer] Infertility Infertility is when you are unable to get pregnant (conceive) after a year of having sex regularly without using birth control. Infertility can also mean that a woman is not able to carry a pregnancy to full term. Both women and men can have fertility problems. What causes infertility? What Causes Infertility in Women? There are many possible causes of infertility in women. For some women, the cause of infertility is not known (unexplained infertility). Infertility can also be linked to more than one cause. Infertility problems in women can be caused by problems with the menstrual cycle or reproductive organs, certain medical conditions, and factors related to lifestyle and age.  Problems with your menstrual cycle can interfere with your ovaries producing eggs (ovulation). This can make it difficult to get pregnant. This includes having a menstrual cycle that is very long, very short, or irregular.  Problems with reproductive organs can include: ? An abnormally narrow cervix or a cervix that does not remain closed during a pregnancy. ? A blockage in your fallopian tubes. ? An abnormally shaped uterus. ? Uterine fibroids. This is a tissue mass (tumor) that can develop on your uterus.  Medical conditions that can affect a woman's fertility include: ? Polycystic ovarian syndrome (PCOS). This is a hormonal disorder that can cause small cysts to grow on your ovaries. This is the most common cause of infertility in women. ? Endometriosis. This is a condition in which the tissue that lines your uterus (endometrium) grows outside of its normal location. ? Primary ovary insufficiency. This is when your ovaries stop producing eggs and hormones before the age of 40. ? Sexually transmitted diseases, such as chlamydia or gonorrhea. These infections can cause scarring in your fallopian tubes. This  makes it difficult for eggs to reach your uterus. ? Autoimmune disorders. These are disorders in which your immune system attacks normal, healthy cells. ? Hormone imbalances.  Other factors include: ? Age. A woman's fertility declines with age, especially after her mid-30s. ? Being under- or overweight. ? Drinking too much alcohol. ? Using drugs. ? Exercising excessively. ? Being exposed to environmental toxins, such as radiation, pesticides, and certain chemicals.  What Causes Infertility in Men? There are many causes of infertility in men. Infertility can be linked to more than one cause. Infertility problems in men can be caused by problems with sperm or the reproductive organs, certain medical conditions, and factors related to lifestyle and age. Some men have unexplained infertility.  Problems with sperm. Infertility can result if there is a problem producing: ? Enough sperm (low sperm count). ? Enough normally-shaped sperm (sperm morphology). ? Sperm that are able to reach the egg (poor motility).  Infertility can also be caused by: ? A problem with hormones. ? Enlarged veins (varicoceles), cysts (spermatoceles), or tumors of the testicles. ? Sexual dysfunction. ? Injury to the testicles. ? A birth defect, such as not having the tubes that carry sperm (vas deferens).  Medical conditions that can affect a man's fertility include: ? Diabetes. ? Cancer treatments, such as chemotherapy or radiation. ? Klinefelter   syndrome. This is an inherited genetic disorder. ? Thyroid problems, such as an under- or overactive thyroid. ? Cystic fibrosis. ? Sexually transmitted diseases.  Other factors include: ? Age. A man's fertility declines with age. ? Drinking too much alcohol. ? Using drugs. ? Being exposed to environmental toxins, such as pesticides and lead.  What are the symptoms of infertility? Being unable to get pregnant after one year of having regular sex without using birth  control is the only sign of infertility. How is infertility diagnosed? In order to be diagnosed with infertility, both partners will have a physical exam. Both partners will also have an extensive medical and sexual history taken. If there is no obvious reason for infertility, additional tests may be done. What Tests Will Women Have? Women may first have tests to check whether they are ovulating each month. The tests may include:  Blood tests to check hormone levels.  An ultrasound of the ovaries. This looks for possible problems on or in the ovaries.  Taking a small sample of the tissue that lines the uterus for examination under a microscope (endometrial biopsy).  Women who are ovulating may have additional tests. These may include:  Hysterosalpingography. ? This is an X-ray of the fallopian tubes and uterus taken after a specific type of dye is injected. ? This test can show the shape of the uterus and whether the fallopian tubes are open.  Laparoscopy. ? In this test, a lighted tube (laparoscope) is used to look for problems in the fallopian tubes and other female organs.  Transvaginal ultrasound. ? This is an imaging test to check for abnormalities of the uterus and ovaries. ? A health care provider can use this test to count the number of follicles on the ovaries.  Hysteroscopy. ? This test involves using a lighted tube to examine the cervix and inside the uterus. ? It is done to find any abnormalities inside the uterus.  What Tests Will Men Have? Tests for men's infertility includes:  Semen tests to check sperm count, morphology, and motility.  Blood tests to check for hormone levels.  Taking a small sample of tissue from inside a testicle (biopsy). This is examined under a microscope.  Blood tests to check for genetic abnormalities (genetic testing).  How are women treated for infertility? Treatment depends on the cause of infertility. Most cases of infertility in  women are treated with medicine or surgery.  Women may take medicine to: ? Correct ovulation problems. ? Treat other health conditions, such as PCOS.  Surgery may be done to: ? Repair damage to the ovaries, fallopian tubes, cervix, or uterus. ? Remove growths from the uterus. ? Remove scar tissue from the uterus, pelvis, or other female organs.  How are men treated for infertility? Treatment depends on the cause of infertility. Most cases of infertility in men are treated with medicine or surgery.  Men may take medicine to: ? Correct hormone problems. ? Treat other health conditions. ? Treat sexual dysfunction.  Surgery may be done to: ? Remove blockages in the reproductive tract. ? Correct other structural problems of the reproductive tract.  What is assisted reproductive technology? Assisted reproductive technology (ART) refers to all treatments and procedures that combine eggs and sperm outside the body to try to help a couple conceive. ART is often combined with fertility drugs to stimulate ovulation. Sometimes ART is done using eggs retrieved from another woman's body (donor eggs) or from previously frozen fertilized eggs (embryos). There are different   of ART. These include:  Intrauterine insemination (IUI). ? In this procedure, sperm is placed directly into a woman's uterus with a long, thin tube. ? This may be most effective for infertility caused by sperm problems, including low sperm count and low motility. ? Can be used in combination with fertility drugs.  In vitro fertilization (IVF). ? This is often done when a woman's fallopian tubes are blocked or when a man has low sperm counts. ? Fertility drugs stimulate the ovaries to produce multiple eggs. Once mature, these eggs are removed from the body and combined with the sperm to be fertilized. ? These fertilized eggs are then placed in the woman's uterus.  This information is not intended to replace advice given to you by  your health care provider. Make sure you discuss any questions you have with your health care provider. Document Released: 05/03/2003 Document Revised: 09/30/2015 Document Reviewed: 01/13/2014 Elsevier Interactive Patient Education  2018 [place] 18-39 Years, Female Preventive care refers to lifestyle choices and visits with your health care provider that can promote health and wellness. What does preventive care include?  A yearly physical exam. This is also called an annual well check.  Dental exams once or twice a year.  Routine eye exams. Ask your health care provider how often you should have your eyes checked.  Personal lifestyle choices, including: ? Daily care of your teeth and gums. ? Regular physical activity. ? Eating a healthy diet. ? Avoiding tobacco and drug use. ? Limiting alcohol use. ? Practicing safe sex. ? Taking vitamin and mineral supplements as recommended by your health care provider. What happens during an annual well check? The services and screenings done by your health care provider during your annual well check will depend on your age, overall health, lifestyle risk factors, and family history of disease. Counseling Your health care provider may ask you questions about your:  Alcohol use.  Tobacco use.  Drug use.  Emotional well-being.  Home and relationship well-being.  Sexual activity.  Eating habits.  Work and work Statistician.  Method of birth control.  Menstrual cycle.  Pregnancy history.  Screening You may have the following tests or measurements:  Height, weight, and BMI.  Diabetes screening. This is done by checking your blood sugar (glucose) after you have not eaten for a while (fasting).  Blood pressure.  Lipid and cholesterol levels. These may be checked every 5 years starting at age 61.  Skin check.  Hepatitis C blood test.  Hepatitis B blood test.  Sexually transmitted disease (STD)  testing.  BRCA-related cancer screening. This may be done if you have a family history of breast, ovarian, tubal, or peritoneal cancers.  Pelvic exam and Pap test. This may be done every 3 years starting at age 36. Starting at age 51, this may be done every 5 years if you have a Pap test in combination with an HPV test.  Discuss your test results, treatment options, and if necessary, the need for more tests with your health care provider. Vaccines Your health care provider may recommend certain vaccines, such as:  Influenza vaccine. This is recommended every year.  Tetanus, diphtheria, and acellular pertussis (Tdap, Td) vaccine. You may need a Td booster every 10 years.  Varicella vaccine. You may need this if you have not been vaccinated.  HPV vaccine. If you are 17 or younger, you may need three doses over 6 months.  Measles, mumps, and rubella (MMR) vaccine. You  may need at least one dose of MMR. You may also need a second dose.  Pneumococcal 13-valent conjugate (PCV13) vaccine. You may need this if you have certain conditions and were not previously vaccinated.  Pneumococcal polysaccharide (PPSV23) vaccine. You may need one or two doses if you smoke cigarettes or if you have certain conditions.  Meningococcal vaccine. One dose is recommended if you are age 64-21 years and a first-year college student living in a residence hall, or if you have one of several medical conditions. You may also need additional booster doses.  Hepatitis A vaccine. You may need this if you have certain conditions or if you travel or work in places where you may be exposed to hepatitis A.  Hepatitis B vaccine. You may need this if you have certain conditions or if you travel or work in places where you may be exposed to hepatitis B.  Haemophilus influenzae type b (Hib) vaccine. You may need this if you have certain risk factors.  Talk to your health care provider about which screenings and vaccines you  need and how often you need them. This information is not intended to replace advice given to you by your health care provider. Make sure you discuss any questions you have with your health care provider. Document Released: 06/26/2001 Document Revised: 01/18/2016 Document Reviewed: 03/01/2015 Elsevier Interactive Patient Education  [employer].

## 2017-10-24 ENCOUNTER — Encounter: Payer: Self-pay | Admitting: Family Medicine

## 2017-10-24 ENCOUNTER — Telehealth: Payer: Self-pay | Admitting: *Deleted

## 2017-10-24 LAB — RPR: RPR: NONREACTIVE

## 2017-10-24 LAB — HIV ANTIBODY (ROUTINE TESTING W REFLEX): HIV SCREEN 4TH GENERATION: NONREACTIVE

## 2017-10-24 NOTE — Assessment & Plan Note (Signed)
Use ovulation prediction kits. If no luck, consider w/u including HSG and semen analysis, with possible referral to REI if needed.

## 2017-10-24 NOTE — Telephone Encounter (Signed)
Received request for Medical Records from [facility]; forwarded to [place] for email/scana/SLS 06/13

## 2017-10-24 NOTE — Assessment & Plan Note (Signed)
Advised on meds and maintenance

## 2017-10-24 NOTE — Progress Notes (Signed)
Subjective:     Katherine Mcfarland is a 32 y.o. female and is here for a comprehensive physical exam. The patient reports problems - infertility. Patient has been attempting pregnancy with her same partner (no new health problems) x 1.5 years since IUD removed. No trouble conceiving in the past. Has regular cycles q 24-25 days. Has h/o SVD x 1, C-S x 1, then VBAC x 2. No pregnancy issues. nml TSH with annual exam last week. Does report some vaginal dryness.  Social History   Socioeconomic History  . Marital status: Single    Spouse name: Not on file  . Number of children: Not on file  . Years of education: Not on file  . Highest education level: Not on file  Occupational History  . Occupation: CMA    Employer: [employer]  . Financial resource strain: Not on file  . Food insecurity:    Worry: Not on file    Inability: Not on file  . Transportation needs:    Medical: Not on file    Non-medical: Not on file  Tobacco Use  . Smoking status: Never Smoker  . Smokeless tobacco: Never Used  Substance and Sexual Activity  . Alcohol use: Yes    Comment: social  . Drug use: No  . Sexual activity: Yes    Birth control/protection: IUD  Lifestyle  . Physical activity:    Days per week: Not on file    Minutes per session: Not on file  . Stress: Not on file  Relationships  . Social connections:    Talks on phone: Not on file    Gets together: Not on file    Attends religious service: Not on file    Active member of club or organization: Not on file    Attends meetings of clubs or organizations: Not on file    Relationship status: Not on file  . Intimate partner violence:    Fear of current or ex partner: Not on file    Emotionally abused: Not on file    Physically abused: Not on file    Forced sexual activity: Not on file  Other Topics Concern  . Not on file  Social History Narrative  . Not on file   Health Maintenance  Topic Date Due  . INFLUENZA VACCINE   12/12/2017  . PAP SMEAR  09/26/2019  . TETANUS/TDAP  11/28/2025  . HIV Screening  Completed    The following portions of the patient's history were reviewed and updated as appropriate: allergies, current medications, past family history, past medical history, past social history, past surgical history and problem list.  Review of Systems Pertinent items noted in HPI and remainder of comprehensive ROS otherwise negative.   Objective:    BP 120/77   Pulse 92   Ht 5\' 6"  (1.676 m)   Wt 236 lb 12.8 oz (107.4 kg)   LMP 10/03/2017   BMI 38.22 kg/m  General appearance: alert, cooperative and appears stated age Head: Normocephalic, without obvious abnormality, atraumatic Neck: no adenopathy, supple, symmetrical, trachea midline and thyroid not enlarged, symmetric, no tenderness/mass/nodules Lungs: clear to auscultation bilaterally Breasts: normal appearance, no masses or tenderness Heart: regular rate and rhythm, S1, S2 normal, no murmur, click, rub or gallop Abdomen: soft, non-tender; bowel sounds normal; no masses,  no organomegaly Pelvic: cervix normal in appearance, external genitalia normal, no adnexal masses or tenderness, no cervical motion tenderness, uterus normal size, shape, and consistency and vagina normal  without discharge Extremities: extremities normal, atraumatic, no cyanosis or edema Pulses: 2+ and symmetric Skin: Skin color, texture, turgor normal. No rashes or lesions Lymph nodes: Cervical, supraclavicular, and axillary nodes normal. Neurologic: Grossly normal    Assessment:    Healthy female exam.      Plan:      Problem List Items Addressed This Visit      Unprioritized   Vitamin D deficiency    Advised on meds and maintenance      Infertility counseling    Use ovulation prediction kits. If no luck, consider w/u including HSG and semen analysis, with possible referral to REI if needed.       Other Visit Diagnoses    Well woman exam with routine  gynecological exam    -  Primary   Relevant Orders   HIV antibody (Completed)   RPR (Completed)   Cytology - PAP   Screening for malignant neoplasm of cervix       Encounter for gynecological examination without abnormal finding       Vaginal dryness       trial of RePhresh, use water based lubricant during intercourse.     Return in 1 year (on 10/24/2018).  See After Visit Summary for Counseling Recommendations

## 2017-10-25 LAB — CYTOLOGY - PAP
Chlamydia: NEGATIVE
Diagnosis: NEGATIVE
HPV: NOT DETECTED
Neisseria Gonorrhea: NEGATIVE

## 2017-11-12 ENCOUNTER — Other Ambulatory Visit: Payer: Self-pay | Admitting: *Deleted

## 2017-11-12 MED ORDER — FLUCONAZOLE 150 MG PO TABS: 150.0000 mg | ORAL_TABLET | Freq: Once | ORAL | 1 refills | Status: AC

## 2017-11-12 MED FILL — FLUCONAZOLE 150 MG TABS: 150 | 15 days supply | Qty: 5 | Fill #0

## 2017-11-21 ENCOUNTER — Encounter: Payer: Self-pay | Admitting: Family Medicine

## 2017-11-21 ENCOUNTER — Ambulatory Visit (INDEPENDENT_AMBULATORY_CARE_PROVIDER_SITE_OTHER): Payer: 59 | Admitting: Family Medicine

## 2017-11-21 VITALS — BP 116/80 | HR 86 | Temp 98.1°F | Ht 66.0 in | Wt 234.0 lb

## 2017-11-21 DIAGNOSIS — L309 Dermatitis, unspecified: Secondary | ICD-10-CM | POA: Diagnosis not present

## 2017-11-21 MED ORDER — TRIAMCINOLONE ACETONIDE 0.1 % EX CREA: 1.0000 "application " | TOPICAL_CREAM | Freq: Two times a day (BID) | CUTANEOUS | 0 refills | Status: AC

## 2017-11-21 MED FILL — TRIAMCINOLONE 0.1% CREAM: 0.1 | 10 days supply | Qty: 30 | Fill #0

## 2017-11-21 NOTE — Patient Instructions (Signed)
Continue an emollient to help keep the area hydrated.   Use cream through 10 days.   Let us know if you need anything.

## 2017-11-21 NOTE — Progress Notes (Signed)
Chief Complaint  Patient presents with  . Rash    c/o rash on back that has been present since [place] day after bettle bite while swimming.     Katherine Mcfarland is a 32 y.o. female here for a skin complaint.  Duration: 6 weeks Location: R side Pruritic? Yes Painful? No Drainage? No New soaps/lotions/topicals/detergents? No Sick contacts? No Other associated symptoms: none Therapies tried thus far: Mupirocin, vaseline  ROS:  Const: No fevers Skin: As noted in HPI  Past Medical History:  Diagnosis Date  . Allergy    pt. reports seasonal allergies  . Headache   . Lactose intolerance   . Palpitations   . Right knee pain   . Tinnitus    Allergies  Allergen Reactions  . Metronidazole Nausea And Vomiting  . Mushroom Extract Complex Diarrhea   BP 116/80 (BP Location: Right Arm, Patient Position: Sitting, Cuff Size: Large)   Pulse 86   Temp 98.1 F (36.7 C) (Oral)   Ht 5\' 6"  (1.676 m)   Wt 234 lb (106.1 kg)   SpO2 100%   BMI 37.77 kg/m  Gen: awake, alert, appearing stated age Lungs: No accessory muscle use Skin: seebelow. No drainage, erythema, TTP, fluctuance, excoriation Psych: Age appropriate judgment and insight   R side  Dermatitis - Plan: triamcinolone cream (KENALOG) 0.1 %  Orders as above. Moylan also recommended to help with dry skin.  There is some postinflammatory hyperpigmentation as well.  Let us know if things are not improving. F/u prn. The patient voiced understanding and agreement to the plan.  [place], DO 11/21/17 12:00 PM

## 2018-01-24 ENCOUNTER — Ambulatory Visit: Payer: Self-pay | Admitting: Family Medicine

## 2018-01-27 ENCOUNTER — Telehealth: Payer: Self-pay | Admitting: *Deleted

## 2018-01-27 ENCOUNTER — Other Ambulatory Visit: Payer: Self-pay | Admitting: Family Medicine

## 2018-01-27 DIAGNOSIS — L700 Acne vulgaris: Secondary | ICD-10-CM

## 2018-01-27 MED ORDER — CLINDAMYCIN PHOS-BENZOYL PEROX 1-5 % EX GEL: Freq: Two times a day (BID) | CUTANEOUS | 0 refills | Status: DC

## 2018-01-27 MED FILL — CLINDAMYCIN PHOS-BENZOYL PE: 1-5 | 30 days supply | Qty: 25 | Fill #0

## 2018-01-27 NOTE — Telephone Encounter (Signed)
Patient would like something called in for acne.

## 2018-01-27 NOTE — Telephone Encounter (Signed)
benzaclin sent in

## 2018-01-28 NOTE — Telephone Encounter (Signed)
Patient notified

## 2018-01-29 ENCOUNTER — Other Ambulatory Visit: Payer: Self-pay

## 2018-01-29 MED ORDER — OMEPRAZOLE 40 MG PO CPDR: 40.0000 mg | DELAYED_RELEASE_CAPSULE | Freq: Every day | ORAL | 1 refills | Status: DC

## 2018-01-29 MED FILL — OMEPRAZOLE 40 MG CPDR: 40 | 90 days supply | Qty: 90 | Fill #0

## 2018-01-29 NOTE — Telephone Encounter (Signed)
Patient requesting omperazole for heartburn daily

## 2018-02-05 ENCOUNTER — Ambulatory Visit: Payer: Self-pay | Admitting: Obstetrics & Gynecology

## 2018-02-25 ENCOUNTER — Encounter: Payer: Self-pay | Admitting: Family Medicine

## 2018-02-25 ENCOUNTER — Other Ambulatory Visit: Payer: Self-pay | Admitting: Family Medicine

## 2018-02-25 DIAGNOSIS — L700 Acne vulgaris: Secondary | ICD-10-CM

## 2018-02-25 MED ORDER — TRETINOIN MICROSPHERE 0.04 % EX GEL
Freq: Every day | CUTANEOUS | 4 refills | Status: DC
Start: 2018-02-25 — End: 2018-03-11

## 2018-02-26 ENCOUNTER — Telehealth: Payer: Self-pay

## 2018-02-26 NOTE — Telephone Encounter (Signed)
PA initiated via Covermymeds; KEY: [id number]. Awaiting determination.

## 2018-02-27 NOTE — Telephone Encounter (Signed)
PA approved.    The request has been approved. The authorization is effective for a maximum of 12 fills from 02/27/2018 to 02/27/2019, as long as the member is enrolled in their current health plan. The request was approved as submitted. A written notification letter will follow with additional details

## 2018-02-28 ENCOUNTER — Other Ambulatory Visit: Payer: Self-pay | Admitting: Family Medicine

## 2018-02-28 ENCOUNTER — Telehealth: Payer: Self-pay

## 2018-02-28 ENCOUNTER — Encounter: Payer: Self-pay | Admitting: Family Medicine

## 2018-02-28 DIAGNOSIS — L700 Acne vulgaris: Secondary | ICD-10-CM

## 2018-02-28 MED ORDER — TRETINOIN 0.025 % EX CREA: TOPICAL_CREAM | Freq: Every day | CUTANEOUS | 3 refills | Status: DC

## 2018-02-28 NOTE — Telephone Encounter (Signed)
PA initiated via Covermymeds; KEY: [id number]. Awaiting determination.

## 2018-02-28 NOTE — Telephone Encounter (Signed)
Pease advise and consider sending in cream rather than gel due to insurance.

## 2018-03-03 ENCOUNTER — Encounter: Payer: Self-pay | Admitting: *Deleted

## 2018-03-03 MED FILL — TRETINOIN 0.025% CREAM: 0.025 | 30 days supply | Qty: 45 | Fill #0

## 2018-03-03 NOTE — Telephone Encounter (Signed)
PA approved. Effective 02/28/2018 to 02/28/2019.

## 2018-03-11 ENCOUNTER — Other Ambulatory Visit: Payer: Self-pay | Admitting: Family Medicine

## 2018-03-11 ENCOUNTER — Encounter: Payer: Self-pay | Admitting: Family Medicine

## 2018-03-11 DIAGNOSIS — T753XXA Motion sickness, initial encounter: Secondary | ICD-10-CM

## 2018-03-11 DIAGNOSIS — F40243 Fear of flying: Secondary | ICD-10-CM

## 2018-03-11 MED ORDER — ALPRAZOLAM 0.5 MG PO TABS: 0.5000 mg | ORAL_TABLET | Freq: Three times a day (TID) | ORAL | 0 refills | Status: DC | PRN

## 2018-03-11 MED ORDER — SCOPOLAMINE 1 MG/3DAYS TD PT72: 1.0000 | MEDICATED_PATCH | TRANSDERMAL | 12 refills | Status: DC

## 2018-03-11 MED FILL — ALPRAZolam 0.5 MG TABS: 0.5 | 4 days supply | Qty: 10 | Fill #0

## 2018-03-11 MED FILL — SCOPOLAMINE 1 MG/3DAYS PT72: 1 | 15 days supply | Qty: 5 | Fill #0

## 2018-03-31 ENCOUNTER — Encounter: Payer: Self-pay | Admitting: Family Medicine

## 2018-03-31 ENCOUNTER — Ambulatory Visit (INDEPENDENT_AMBULATORY_CARE_PROVIDER_SITE_OTHER): Payer: 59 | Admitting: Family Medicine

## 2018-03-31 VITALS — BP 135/88 | HR 81 | Temp 98.1°F | Resp 16 | Ht 66.0 in | Wt 227.0 lb

## 2018-03-31 DIAGNOSIS — M545 Low back pain, unspecified: Secondary | ICD-10-CM

## 2018-03-31 DIAGNOSIS — M5440 Lumbago with sciatica, unspecified side: Secondary | ICD-10-CM | POA: Diagnosis not present

## 2018-03-31 MED ORDER — PREDNISONE 10 MG PO TABS: ORAL_TABLET | ORAL | 0 refills | Status: DC

## 2018-03-31 MED ORDER — METHOCARBAMOL 500 MG PO TABS
500.0000 mg | ORAL_TABLET | Freq: Four times a day (QID) | ORAL | 1 refills | Status: DC
Start: 2018-03-31 — End: 2018-05-27

## 2018-03-31 MED FILL — predniSONE 10 MG TABS: 10 | 12 days supply | Qty: 20 | Fill #0

## 2018-03-31 MED FILL — METHOCARBAMOL 500 MG TABLET: 500 | 11 days supply | Qty: 45 | Fill #0

## 2018-03-31 NOTE — Progress Notes (Signed)
Patient ID: Katherine Mcfarland, female    DOB: Aug 20, 1985  Age: 32 y.o. MRN: [id number]    Subjective:  Subjective  HPI Katherine Mcfarland presents for low back pain radiating down both thighs  R>L  Pt was taking laundry out of dryer and when   Review of Systems  Constitutional: Negative for appetite change, diaphoresis, fatigue and unexpected weight change.  Eyes: Negative for pain, redness and visual disturbance.  Respiratory: Negative for cough, chest tightness, shortness of breath and wheezing.   Cardiovascular: Negative for chest pain, palpitations and leg swelling.  Endocrine: Negative for cold intolerance, heat intolerance, polydipsia, polyphagia and polyuria.  Genitourinary: Negative for difficulty urinating, dysuria and frequency.  Musculoskeletal: Positive for back pain. Negative for arthralgias.  Neurological: Positive for numbness. Negative for dizziness, light-headedness and headaches.    History Past Medical History:  Diagnosis Date  . Allergy    pt. reports seasonal allergies  . Headache   . Lactose intolerance   . Palpitations   . Right knee pain   . Tinnitus     She has a past surgical history that includes Cesarean section and Wisdom tooth extraction.   Her family history includes Diabetes in her maternal grandmother; Hyperlipidemia in her mother; Hypertension in her father, maternal grandmother, and mother.She reports that she has never smoked. She has never used smokeless tobacco. She reports that she drinks alcohol. She reports that she does not use drugs.  Current Outpatient Medications on File Prior to Visit  Medication Sig Dispense Refill  . ALPRAZolam (XANAX) 0.5 MG tablet Take 1 tablet (0.5 mg total) by mouth 3 (three) times daily as needed for anxiety. 10 tablet 0  . clindamycin-benzoyl peroxide (BENZACLIN) gel Apply topically 2 (two) times daily. 25 g 0  . diclofenac (CATAFLAM) 50 MG tablet Take 1 tablet (50 mg total) by mouth 3 (three) times daily with  meals as needed. 30 tablet 1  . metFORMIN (GLUCOPHAGE) 500 MG tablet Take 1 tablet (500 mg total) by mouth daily after breakfast. 30 tablet 0  . omeprazole (PRILOSEC) 40 MG capsule Take 1 capsule (40 mg total) by mouth daily. 90 capsule 1  . scopolamine (TRANSDERM-SCOP, 1.5 MG,) 1 MG/3DAYS Place 1 patch (1.5 mg total) onto the skin every 3 (three) days. 5 patch 12  . tretinoin (RETIN-A) 0.025 % cream Apply topically at bedtime. 45 g 3  . Vitamin D, Ergocalciferol, (DRISDOL) 50000 units CAPS capsule Take 1 capsule (50,000 Units total) by mouth every 7 (seven) days. 5 capsule 3   No current facility-administered medications on file prior to visit.      Objective:  Objective  Physical Exam  Constitutional: She is oriented to person, place, and time.  Musculoskeletal: She exhibits tenderness.  Neurological: She is alert and oriented to person, place, and time. She displays normal reflexes. No sensory deficit. She exhibits normal muscle tone.  R hip flexor 3/5 Otherwise 5/5 strength in both low ext slr 45 degree b/l pt c/o pulling beyond that   Nursing note and vitals reviewed.  BP 135/88   Pulse 81   Temp 98.1 F (36.7 C) (Oral)   Resp 16   Ht 5\' 6"  (1.676 m)   Wt 227 lb (103 kg)   SpO2 100%   BMI 36.64 kg/m  Wt Readings from Last 3 Encounters:  03/31/18 227 lb (103 kg)  11/21/17 234 lb (106.1 kg)  10/23/17 236 lb 12.8 oz (107.4 kg)     Lab Results  Component Value  Date   WBC 6.8 10/18/2017   HGB 11.4 (L) 10/18/2017   HCT 34.9 (L) 10/18/2017   PLT 328 10/18/2017   GLUCOSE 100 (H) 10/18/2017   CHOL 178 10/18/2017   TRIG 120 10/18/2017   HDL 46 (L) 10/18/2017   LDLCALC 109 (H) 10/18/2017   ALT 12 10/18/2017   AST 17 10/18/2017   NA 137 10/18/2017   K 4.2 10/18/2017   CL 101 10/18/2017   CREATININE 0.83 10/18/2017   BUN 13 10/18/2017   CO2 24 10/18/2017   TSH 1.39 10/18/2017   HGBA1C 6.1 (H) 10/18/2017    Dg Lumbar Spine Complete  Result Date:  10/03/2017 CLINICAL DATA:  Low back pain radiating down right leg EXAM: LUMBAR SPINE - COMPLETE 4+ VIEW COMPARISON:  None. FINDINGS: There is no evidence of lumbar spine fracture. Alignment is normal. Intervertebral disc spaces are maintained. IMPRESSION: Negative. Electronically Signed   By: Donavan Foil M.D.   On: 10/03/2017 03:47     Assessment & Plan:  Plan  I am having Devinne A. Lesiak start on predniSONE and methocarbamol. I am also having her maintain her metFORMIN, diclofenac, Vitamin D (Ergocalciferol), clindamycin-benzoyl peroxide, omeprazole, tretinoin, scopolamine, and ALPRAZolam.  Meds ordered this encounter  Medications  . predniSONE (DELTASONE) 10 MG tablet    Sig: TAKE 3 TABLETS PO QD FOR 3 DAYS THEN TAKE 2 TABLETS PO QD FOR 3 DAYS THEN TAKE 1 TABLET PO QD FOR 3 DAYS THEN TAKE 1/2 TAB PO QD FOR 3 DAYS    Dispense:  20 tablet    Refill:  0  . methocarbamol (ROBAXIN) 500 MG tablet    Sig: Take 1 tablet (500 mg total) by mouth 4 (four) times daily.    Dispense:  45 tablet    Refill:  1    Problem List Items Addressed This Visit    None    Visit Diagnoses    Low back pain with radiation    -  Primary   Relevant Medications   predniSONE (DELTASONE) 10 MG tablet   methocarbamol (ROBAXIN) 500 MG tablet    ice/ heat meds as above Consider PT if no better vs MRI Follow-up: Return if symptoms worsen or fail to improve.  Ann Held, DO

## 2018-03-31 NOTE — Patient Instructions (Signed)

## 2018-04-28 ENCOUNTER — Encounter: Payer: Self-pay | Admitting: Family Medicine

## 2018-04-28 ENCOUNTER — Other Ambulatory Visit: Payer: Self-pay | Admitting: Family Medicine

## 2018-04-28 DIAGNOSIS — B373 Candidiasis of vulva and vagina: Secondary | ICD-10-CM

## 2018-04-28 DIAGNOSIS — B3731 Acute candidiasis of vulva and vagina: Secondary | ICD-10-CM

## 2018-04-28 MED ORDER — DICLOFENAC POTASSIUM 50 MG PO TABS: 50.0000 mg | ORAL_TABLET | Freq: Three times a day (TID) | ORAL | 2 refills | Status: DC | PRN

## 2018-04-28 MED ORDER — FLUCONAZOLE 150 MG PO TABS: 150.0000 mg | ORAL_TABLET | Freq: Once | ORAL | 3 refills | Status: AC

## 2018-04-28 MED ORDER — FLUCONAZOLE 150 MG PO TABS: 150.0000 mg | ORAL_TABLET | Freq: Once | ORAL | 3 refills | Status: DC

## 2018-04-28 MED FILL — FLUCONAZOLE 150 MG TABS: 150 | 15 days supply | Qty: 5 | Fill #0

## 2018-04-28 MED FILL — DICLOFENAC POT 50 MG TABLET: 50 | 30 days supply | Qty: 90 | Fill #0

## 2018-05-12 ENCOUNTER — Other Ambulatory Visit: Payer: Self-pay | Admitting: Family Medicine

## 2018-05-12 MED ORDER — FLUCONAZOLE 150 MG PO TABS: ORAL_TABLET | ORAL | 3 refills | Status: DC

## 2018-05-13 ENCOUNTER — Other Ambulatory Visit: Payer: Self-pay | Admitting: *Deleted

## 2018-05-13 MED ORDER — FLUCONAZOLE 150 MG PO TABS: 150.0000 mg | ORAL_TABLET | Freq: Once | ORAL | 1 refills | Status: AC

## 2018-05-19 ENCOUNTER — Encounter: Payer: Self-pay | Admitting: *Deleted

## 2018-05-27 ENCOUNTER — Encounter: Payer: Self-pay | Admitting: Family Medicine

## 2018-05-27 ENCOUNTER — Other Ambulatory Visit: Payer: Self-pay | Admitting: Family Medicine

## 2018-05-27 DIAGNOSIS — M545 Low back pain, unspecified: Secondary | ICD-10-CM

## 2018-05-27 DIAGNOSIS — M5440 Lumbago with sciatica, unspecified side: Principal | ICD-10-CM

## 2018-05-27 MED ORDER — METHOCARBAMOL 500 MG PO TABS: 500.0000 mg | ORAL_TABLET | Freq: Four times a day (QID) | ORAL | 1 refills | Status: DC

## 2018-05-27 MED ORDER — PREDNISONE 10 MG PO TABS: ORAL_TABLET | ORAL | 0 refills | Status: DC

## 2018-05-27 MED FILL — METHOCARBAMOL 500 MG TABLET: 500 | 12 days supply | Qty: 45 | Fill #0

## 2018-05-27 MED FILL — predniSONE 10 MG TABS: 10 | 12 days supply | Qty: 20 | Fill #0

## 2018-05-27 NOTE — Telephone Encounter (Signed)
done

## 2018-07-04 ENCOUNTER — Ambulatory Visit: Payer: Self-pay | Admitting: Family Medicine

## 2018-07-14 ENCOUNTER — Other Ambulatory Visit: Payer: Self-pay | Admitting: Family Medicine

## 2018-07-14 ENCOUNTER — Other Ambulatory Visit (INDEPENDENT_AMBULATORY_CARE_PROVIDER_SITE_OTHER): Payer: 59

## 2018-07-14 ENCOUNTER — Encounter: Payer: Self-pay | Admitting: Family Medicine

## 2018-07-14 ENCOUNTER — Other Ambulatory Visit (HOSPITAL_COMMUNITY)
Admission: RE | Admit: 2018-07-14 | Discharge: 2018-07-14 | Disposition: A | Discharge status: Home / Self Care | Payer: 59 | Source: Ambulatory Visit | Attending: Family Medicine | Admitting: Family Medicine

## 2018-07-14 DIAGNOSIS — R8290 Unspecified abnormal findings in urine: Secondary | ICD-10-CM | POA: Insufficient documentation

## 2018-07-14 LAB — POC URINALSYSI DIPSTICK (AUTOMATED)
Bilirubin, UA: NEGATIVE
Blood, UA: NEGATIVE
Glucose, UA: NEGATIVE
Ketones, UA: NEGATIVE
Leukocytes, UA: NEGATIVE
Nitrite, UA: NEGATIVE
Protein, UA: NEGATIVE
Spec Grav, UA: >=1.03 — AB (ref 1.010–1.025)
Urobilinogen, UA: 0.2 E.U./dL
pH, UA: 5 (ref 5.0–8.0)

## 2018-07-15 LAB — URINE CYTOLOGY ANCILLARY ONLY
Chlamydia: NEGATIVE
Neisseria Gonorrhea: NEGATIVE
Trichomonas: NEGATIVE

## 2018-07-16 LAB — URINE CYTOLOGY ANCILLARY ONLY
Bacterial vaginitis: NEGATIVE
Candida vaginitis: NEGATIVE

## 2018-07-18 ENCOUNTER — Ambulatory Visit (INDEPENDENT_AMBULATORY_CARE_PROVIDER_SITE_OTHER): Payer: 59 | Admitting: Family Medicine

## 2018-07-18 ENCOUNTER — Encounter: Payer: Self-pay | Admitting: Family Medicine

## 2018-07-18 ENCOUNTER — Telehealth: Payer: Self-pay

## 2018-07-18 VITALS — BP 139/86 | HR 89 | Ht 66.0 in | Wt 233.0 lb

## 2018-07-18 DIAGNOSIS — E559 Vitamin D deficiency, unspecified: Secondary | ICD-10-CM | POA: Diagnosis not present

## 2018-07-18 DIAGNOSIS — R5383 Other fatigue: Secondary | ICD-10-CM

## 2018-07-18 DIAGNOSIS — G4700 Insomnia, unspecified: Secondary | ICD-10-CM

## 2018-07-18 DIAGNOSIS — G43909 Migraine, unspecified, not intractable, without status migrainosus: Secondary | ICD-10-CM | POA: Diagnosis not present

## 2018-07-18 DIAGNOSIS — F419 Anxiety disorder, unspecified: Secondary | ICD-10-CM | POA: Diagnosis not present

## 2018-07-18 MED ORDER — ESCITALOPRAM OXALATE 10 MG PO TABS: 10.0000 mg | ORAL_TABLET | Freq: Every day | ORAL | 2 refills | Status: DC

## 2018-07-18 MED ORDER — PHENTERMINE-TOPIRAMATE ER 3.75-23 MG PO CP24: ORAL_CAPSULE | ORAL | 0 refills | Status: DC

## 2018-07-18 MED FILL — ESCITALOPRAM 10 MG TABLET: 10 | 30 days supply | Qty: 30 | Fill #0

## 2018-07-18 NOTE — Telephone Encounter (Signed)
PA initiated via Covermymeds; KEY: [id number]. Awaiting determination.

## 2018-07-18 NOTE — Patient Instructions (Signed)

## 2018-07-18 NOTE — Progress Notes (Signed)
Patient ID: Katherine Mcfarland, female    DOB: [birth year]  Age: 33 y.o. MRN: [id number]    Subjective:  Subjective  HPI  Katherine Mcfarland presents with c/o anxiety and fatigue.  She is not sleeping well at night.  She lightly snores and does not stop breathing per her boyfriend.   She is anxious about school -- she also works 2 jobs and has 4 children She is also struggling to lose weight---and was interested in trying qsymia.    Review of Systems  Constitutional: Negative for appetite change, diaphoresis, fatigue and unexpected weight change.  Eyes: Negative for pain, redness and visual disturbance.  Respiratory: Negative for cough, chest tightness, shortness of breath and wheezing.   Cardiovascular: Negative for chest pain, palpitations and leg swelling.  Endocrine: Negative for cold intolerance, heat intolerance, polydipsia, polyphagia and polyuria.  Genitourinary: Negative for difficulty urinating, dysuria and frequency.  Neurological: Negative for dizziness, light-headedness, numbness and headaches.  Psychiatric/Behavioral: Positive for sleep disturbance. Negative for confusion, decreased concentration, dysphoric mood, self-injury and suicidal ideas. The patient is nervous/anxious.     History Past Medical History:  Diagnosis Date  . Allergy    pt. reports seasonal allergies  . Headache   . Lactose intolerance   . Palpitations   . Right knee pain   . Tinnitus     She has a past surgical history that includes Cesarean section and Wisdom tooth extraction.   Her family history includes Diabetes in her maternal grandmother; Hyperlipidemia in her mother; Hypertension in her father, maternal grandmother, and mother.She reports that she has never smoked. She has never used smokeless tobacco. She reports current alcohol use. She reports that she does not use drugs.  Current Outpatient Medications on File Prior to Visit  Medication Sig Dispense Refill  . diclofenac (CATAFLAM) 50 MG  tablet Take 1 tablet (50 mg total) by mouth 3 (three) times daily with meals as needed. 90 tablet 2  . tretinoin (RETIN-A) 0.025 % cream Apply topically at bedtime. 45 g 3   No current facility-administered medications on file prior to visit.      Objective:  Objective  Physical Exam Vitals signs and nursing note reviewed.  Constitutional:      Appearance: She is well-developed.  HENT:     Head: Normocephalic and atraumatic.  Eyes:     Conjunctiva/sclera: Conjunctivae normal.  Neck:     Musculoskeletal: Normal range of motion and neck supple.     Thyroid: No thyromegaly.     Vascular: No carotid bruit or JVD.  Cardiovascular:     Rate and Rhythm: Normal rate and regular rhythm.     Heart sounds: Normal heart sounds. No murmur.  Pulmonary:     Effort: Pulmonary effort is normal. No respiratory distress.     Breath sounds: Normal breath sounds. No wheezing or rales.  Chest:     Chest wall: No tenderness.  Neurological:     Mental Status: She is alert and oriented to person, place, and time.  Psychiatric:        Mood and Affect: Affect normal. Mood is anxious.        Speech: Speech normal.        Behavior: Behavior normal. Behavior is cooperative.        Thought Content: Thought content normal.        Cognition and Memory: Cognition and memory normal.        Judgment: Judgment normal.    BP 139/86  Pulse 89   Ht 5\' 6"  (1.676 m)   Wt 233 lb (105.7 kg)   SpO2 100%   BMI 37.61 kg/m  Wt Readings from Last 3 Encounters:  07/18/18 233 lb (105.7 kg)  03/31/18 227 lb (103 kg)  11/21/17 234 lb (106.1 kg)     Lab Results  Component Value Date   WBC 6.7 07/18/2018   HGB 11.1 (L) 07/18/2018   HCT 34.6 (L) 07/18/2018   PLT 328 07/18/2018   GLUCOSE 123 (H) 07/18/2018   CHOL 178 10/18/2017   TRIG 120 10/18/2017   HDL 46 (L) 10/18/2017   LDLCALC 109 (H) 10/18/2017   ALT 12 07/18/2018   AST 16 07/18/2018   NA 137 07/18/2018   K 4.2 07/18/2018   CL 102 07/18/2018    CREATININE 0.83 07/18/2018   BUN 15 07/18/2018   CO2 24 07/18/2018   TSH 1.39 10/18/2017   HGBA1C 6.1 (H) 10/18/2017    No results found.   Assessment & Plan:  Plan  I have discontinued Katherine Mcfarland's methocarbamol and predniSONE. I am also having her start on escitalopram and Phentermine-Topiramate. Additionally, I am having her maintain her tretinoin and diclofenac.  Meds ordered this encounter  Medications  . escitalopram (LEXAPRO) 10 MG tablet    Sig: Take 1 tablet (10 mg total) by mouth at bedtime.    Dispense:  30 tablet    Refill:  2  . Phentermine-Topiramate (QSYMIA) 3.75-23 MG CP24    Sig: 1 po qam    Dispense:  30 capsule    Refill:  0    Problem List Items Addressed This Visit      Unprioritized   Anxiety    qsymia may worsen anxiety Start lexapro 10 daily Go back to counseling  rto 1 month      Relevant Medications   escitalopram (LEXAPRO) 10 MG tablet   Other Relevant Orders   CBC with Differential/Platelet (Completed)   Vitamin B12 (Completed)   Comprehensive metabolic panel (Completed)   Vitamin D (25 hydroxy) (Completed)   Epstein-Barr virus VCA antibody panel   hCG, serum, qualitative (Completed)   Migraine    topamax in qsymia may help F/u prn      Relevant Medications   escitalopram (LEXAPRO) 10 MG tablet   Morbid obesity ([place])    qsymia started rto 1 month or sooner prn      Relevant Medications   Phentermine-Topiramate (QSYMIA) 3.75-23 MG CP24   Vitamin D deficiency    Check labs       Relevant Orders   Vitamin D (25 hydroxy) (Completed)    Other Visit Diagnoses    Other fatigue    -  Primary   Relevant Orders   CBC with Differential/Platelet (Completed)   Vitamin B12 (Completed)   Comprehensive metabolic panel (Completed)   Vitamin D (25 hydroxy) (Completed)   Epstein-Barr virus VCA antibody panel   hCG, serum, qualitative (Completed)   Insomnia, unspecified type       Relevant Orders   CBC with  Differential/Platelet (Completed)   Vitamin B12 (Completed)   Comprehensive metabolic panel (Completed)   Vitamin D (25 hydroxy) (Completed)   Epstein-Barr virus VCA antibody panel   hCG, serum, qualitative (Completed)      Follow-up: Return in about 4 weeks (around 08/15/2018), or if symptoms worsen or fail to improve.  Ann Held, DO

## 2018-07-19 ENCOUNTER — Other Ambulatory Visit: Payer: Self-pay | Admitting: Family Medicine

## 2018-07-19 DIAGNOSIS — R739 Hyperglycemia, unspecified: Secondary | ICD-10-CM

## 2018-07-19 DIAGNOSIS — E559 Vitamin D deficiency, unspecified: Secondary | ICD-10-CM

## 2018-07-19 MED ORDER — VITAMIN D (ERGOCALCIFEROL) 1.25 MG (50000 UNIT) PO CAPS: 50000.0000 [IU] | ORAL_CAPSULE | ORAL | 0 refills | Status: DC

## 2018-07-20 NOTE — Assessment & Plan Note (Signed)
qsymia started rto 1 month or sooner prn

## 2018-07-20 NOTE — Assessment & Plan Note (Signed)
qsymia may worsen anxiety Start lexapro 10 daily Go back to counseling  rto 1 month

## 2018-07-20 NOTE — Assessment & Plan Note (Signed)
Check labs 

## 2018-07-20 NOTE — Assessment & Plan Note (Signed)
topamax in qsymia may help F/u prn

## 2018-07-21 LAB — EPSTEIN-BARR VIRUS VCA ANTIBODY PANEL
EBV NA IgG: 523 U/mL — ABNORMAL HIGH
EBV VCA IgG: >750 U/mL — ABNORMAL HIGH
EBV VCA IgM: 112 U/mL — ABNORMAL HIGH

## 2018-07-21 LAB — CBC WITH DIFFERENTIAL/PLATELET
Absolute Monocytes: 409 cells/uL (ref 200–950)
Basophils Absolute: 27 cells/uL (ref 0–200)
Basophils Relative: 0.4 %
Eosinophils Absolute: 221 cells/uL (ref 15–500)
Eosinophils Relative: 3.3 %
HCT: 34.6 % — ABNORMAL LOW (ref 35.0–45.0)
Hemoglobin: 11.1 g/dL — ABNORMAL LOW (ref 11.7–15.5)
Lymphs Abs: 3116 cells/uL (ref 850–3900)
MCH: 26.2 pg — ABNORMAL LOW (ref 27.0–33.0)
MCHC: 32.1 g/dL (ref 32.0–36.0)
MCV: 81.8 fL (ref 80.0–100.0)
MPV: 10.6 fL (ref 7.5–12.5)
Monocytes Relative: 6.1 %
NEUTROS PCT: 43.7 %
Neutro Abs: 2928 cells/uL (ref 1500–7800)
Platelets: 328 10*3/uL (ref 140–400)
RBC: 4.23 10*6/uL (ref 3.80–5.10)
RDW: 12.8 % (ref 11.0–15.0)
Total Lymphocyte: 46.5 %
WBC: 6.7 10*3/uL (ref 3.8–10.8)

## 2018-07-21 LAB — COMPREHENSIVE METABOLIC PANEL
AG Ratio: 1.5 (calc) (ref 1.0–2.5)
ALT: 12 U/L (ref 6–29)
AST: 16 U/L (ref 10–30)
Albumin: 4.1 g/dL (ref 3.6–5.1)
Alkaline phosphatase (APISO): 66 U/L (ref 31–125)
BUN: 15 mg/dL (ref 7–25)
CHLORIDE: 102 mmol/L (ref 98–110)
CO2: 24 mmol/L (ref 20–32)
Calcium: 9.2 mg/dL (ref 8.6–10.2)
Creat: 0.83 mg/dL (ref 0.50–1.10)
GLOBULIN: 2.8 g/dL (ref 1.9–3.7)
Glucose, Bld: 123 mg/dL — ABNORMAL HIGH (ref 65–99)
Potassium: 4.2 mmol/L (ref 3.5–5.3)
SODIUM: 137 mmol/L (ref 135–146)
Total Bilirubin: 0.2 mg/dL (ref 0.2–1.2)
Total Protein: 6.9 g/dL (ref 6.1–8.1)

## 2018-07-21 LAB — HCG, SERUM, QUALITATIVE: Preg, Serum: NEGATIVE

## 2018-07-21 LAB — VITAMIN B12: Vitamin B-12: 652 pg/mL (ref 200–1100)

## 2018-07-21 LAB — VITAMIN D 25 HYDROXY (VIT D DEFICIENCY, FRACTURES): Vit D, 25-Hydroxy: 16 ng/mL — ABNORMAL LOW (ref 30–100)

## 2018-07-21 MED FILL — VIT D2 1.25 MG (50,000 UNIT: 1.25 MG | 84 days supply | Qty: 12 | Fill #0

## 2018-07-23 NOTE — Telephone Encounter (Signed)
PA approved for 1 fill from 07/22/2018 to 08/05/2018.

## 2018-07-31 ENCOUNTER — Encounter: Payer: Self-pay | Admitting: Family Medicine

## 2018-08-01 ENCOUNTER — Other Ambulatory Visit: Payer: Self-pay | Admitting: Family Medicine

## 2018-08-01 DIAGNOSIS — G43909 Migraine, unspecified, not intractable, without status migrainosus: Secondary | ICD-10-CM

## 2018-08-01 MED ORDER — TOPIRAMATE 25 MG PO TABS: ORAL_TABLET | ORAL | 0 refills | Status: DC

## 2018-08-01 MED FILL — TOPIRAMATE 25 MG TAB: 25 | 21 days supply | Qty: 42 | Fill #0

## 2018-08-19 ENCOUNTER — Other Ambulatory Visit: Payer: Self-pay | Admitting: *Deleted

## 2018-08-19 ENCOUNTER — Other Ambulatory Visit: Payer: Self-pay

## 2018-08-19 ENCOUNTER — Other Ambulatory Visit (INDEPENDENT_AMBULATORY_CARE_PROVIDER_SITE_OTHER): Payer: 59

## 2018-08-19 DIAGNOSIS — Z111 Encounter for screening for respiratory tuberculosis: Secondary | ICD-10-CM

## 2018-08-21 LAB — QUANTIFERON-TB GOLD PLUS
Mitogen-NIL: 7.53 IU/mL
NIL: 0.02 IU/mL
QuantiFERON-TB Gold Plus: NEGATIVE
TB1-NIL: 0.03 IU/mL
TB2-NIL: 0.03 IU/mL

## 2018-08-29 ENCOUNTER — Encounter: Payer: Self-pay | Admitting: *Deleted

## 2018-09-02 ENCOUNTER — Encounter: Payer: Self-pay | Admitting: Family Medicine

## 2018-09-03 ENCOUNTER — Other Ambulatory Visit: Payer: Self-pay | Admitting: Family Medicine

## 2018-09-03 DIAGNOSIS — J302 Other seasonal allergic rhinitis: Secondary | ICD-10-CM

## 2018-09-03 MED ORDER — TRIAMCINOLONE ACETONIDE 55 MCG/ACT NA AERO: 2.0000 | INHALATION_SPRAY | Freq: Every day | NASAL | 12 refills | Status: DC

## 2018-09-03 MED ORDER — LEVOCETIRIZINE DIHYDROCHLORIDE 5 MG PO TABS: 5.0000 mg | ORAL_TABLET | Freq: Every evening | ORAL | 3 refills | Status: DC

## 2018-09-03 MED FILL — TRIAMCINOLONE ACETONIDE 55: 55 | 30 days supply | Qty: 17 | Fill #0

## 2018-09-03 MED FILL — LEVOCETIRIZINE 5 MG TABLET: 5 | 90 days supply | Qty: 90 | Fill #0

## 2018-09-09 ENCOUNTER — Encounter: Payer: Self-pay | Admitting: Family Medicine

## 2018-09-09 ENCOUNTER — Other Ambulatory Visit: Payer: Self-pay

## 2018-09-09 ENCOUNTER — Ambulatory Visit (INDEPENDENT_AMBULATORY_CARE_PROVIDER_SITE_OTHER): Payer: 59 | Admitting: Family Medicine

## 2018-09-09 VITALS — BP 128/88 | HR 81 | Wt 230.0 lb

## 2018-09-09 DIAGNOSIS — S0501XA Injury of conjunctiva and corneal abrasion without foreign body, right eye, initial encounter: Secondary | ICD-10-CM | POA: Diagnosis not present

## 2018-09-09 MED ORDER — MOXIFLOXACIN HCL 0.5 % OP SOLN: 1.0000 [drp] | Freq: Three times a day (TID) | OPHTHALMIC | 0 refills | Status: DC

## 2018-09-09 MED FILL — MOXIFLOXACIN HCL 0.5 % SOLN: 0.5 | 20 days supply | Qty: 3 | Fill #0

## 2018-09-09 NOTE — Telephone Encounter (Signed)
Appt scheduled

## 2018-09-09 NOTE — Progress Notes (Signed)
Patient ID: Katherine Mcfarland, female    DOB: [birth year]  Age: 33 y.o. MRN: [id number]    Subjective:  Subjective  HPI Katherine Mcfarland presents for possible pink eye R eye -- she noticed burning and irritation in r eye today.  It feels like there is something in the eye.  She states her eye has been crusted shut every am.  No pain  Review of Systems  Constitutional: Negative for appetite change, diaphoresis, fatigue and unexpected weight change.  Eyes: Positive for discharge and redness. Negative for pain and visual disturbance.  Respiratory: Negative for cough, chest tightness, shortness of breath and wheezing.   Cardiovascular: Negative for chest pain, palpitations and leg swelling.  Endocrine: Negative for cold intolerance, heat intolerance, polydipsia, polyphagia and polyuria.  Genitourinary: Negative for difficulty urinating, dysuria and frequency.  Neurological: Negative for dizziness, light-headedness, numbness and headaches.    History Past Medical History:  Diagnosis Date  . Allergy    pt. reports seasonal allergies  . Headache   . Lactose intolerance   . Palpitations   . Right knee pain   . Tinnitus     She has a past surgical history that includes Cesarean section and Wisdom tooth extraction.   Her family history includes Diabetes in her maternal grandmother; Hyperlipidemia in her mother; Hypertension in her father, maternal grandmother, and mother.She reports that she has never smoked. She has never used smokeless tobacco. She reports current alcohol use. She reports that she does not use drugs.  Current Outpatient Medications on File Prior to Visit  Medication Sig Dispense Refill  . diclofenac (CATAFLAM) 50 MG tablet Take 1 tablet (50 mg total) by mouth 3 (three) times daily with meals as needed. 90 tablet 2  . escitalopram (LEXAPRO) 10 MG tablet Take 1 tablet (10 mg total) by mouth at bedtime. 30 tablet 2  . levocetirizine (XYZAL) 5 MG tablet Take 1 tablet (5 mg  total) by mouth every evening. 90 tablet 3  . Phentermine-Topiramate (QSYMIA) 3.75-23 MG CP24 1 po qam 30 capsule 0  . topiramate (TOPAMAX) 25 MG tablet 1 po qhs x 1 week then 1 po bid x 1 week then 1 in am and 2 in pm x 1 week 42 tablet 0  . tretinoin (RETIN-A) 0.025 % cream Apply topically at bedtime. 45 g 3  . triamcinolone (NASACORT) 55 MCG/ACT AERO nasal inhaler Place 2 sprays into the nose daily. 1 Inhaler 12  . Vitamin D, Ergocalciferol, (DRISDOL) 1.25 MG (50000 UT) CAPS capsule Take 1 capsule (50,000 Units total) by mouth every 7 (seven) days. 12 capsule 0   No current facility-administered medications on file prior to visit.      Objective:  Objective  Physical Exam Vitals signs and nursing note reviewed.  Constitutional:      Appearance: She is well-developed.  HENT:     Head: Normocephalic and atraumatic.  Eyes:     Conjunctiva/sclera: Conjunctivae normal.   Neck:     Musculoskeletal: Normal range of motion and neck supple.     Thyroid: No thyromegaly.     Vascular: No carotid bruit or JVD.  Cardiovascular:     Rate and Rhythm: Normal rate and regular rhythm.     Heart sounds: Normal heart sounds. No murmur.  Pulmonary:     Effort: Pulmonary effort is normal. No respiratory distress.     Breath sounds: Normal breath sounds. No wheezing or rales.  Chest:     Chest wall: No tenderness.  Neurological:     Mental Status: She is alert and oriented to person, place, and time.    BP 128/88   Pulse 81   Wt 230 lb (104.3 kg)   SpO2 97%   BMI 37.12 kg/m  Wt Readings from Last 3 Encounters:  09/19/18 230 lb (104.3 kg)  07/18/18 233 lb (105.7 kg)  03/31/18 227 lb (103 kg)     Lab Results  Component Value Date   WBC 6.7 07/18/2018   HGB 11.1 (L) 07/18/2018   HCT 34.6 (L) 07/18/2018   PLT 328 07/18/2018   GLUCOSE 123 (H) 07/18/2018   CHOL 178 10/18/2017   TRIG 120 10/18/2017   HDL 46 (L) 10/18/2017   LDLCALC 109 (H) 10/18/2017   ALT 12 07/18/2018   AST 16  07/18/2018   NA 137 07/18/2018   K 4.2 07/18/2018   CL 102 07/18/2018   CREATININE 0.83 07/18/2018   BUN 15 07/18/2018   CO2 24 07/18/2018   TSH 1.39 10/18/2017   HGBA1C 6.1 (H) 10/18/2017    No results found.   Assessment & Plan:  Plan  I am having Katherine Mcfarland start on moxifloxacin. I am also having her maintain her tretinoin, diclofenac, escitalopram, Phentermine-Topiramate, Vitamin D (Ergocalciferol), topiramate, triamcinolone, and levocetirizine.  Meds ordered this encounter  Medications  . moxifloxacin (VIGAMOX) 0.5 % ophthalmic solution    Sig: Place 1 drop into the right eye 3 (three) times daily.    Dispense:  3 mL    Refill:  0    Problem List Items Addressed This Visit    None    Visit Diagnoses    Abrasion of right cornea, initial encounter    -  Primary   Relevant Medications   moxifloxacin (VIGAMOX) 0.5 % ophthalmic solution    cool compresses  If no better in 2-3 days go to opth   Follow-up: Return if symptoms worsen or fail to improve.  Ann Held, DO

## 2018-09-23 ENCOUNTER — Ambulatory Visit (INDEPENDENT_AMBULATORY_CARE_PROVIDER_SITE_OTHER): Payer: 59 | Admitting: Obstetrics & Gynecology

## 2018-09-23 ENCOUNTER — Encounter: Payer: Self-pay | Admitting: Obstetrics & Gynecology

## 2018-09-23 VITALS — Ht 66.0 in | Wt 240.0 lb

## 2018-09-23 DIAGNOSIS — D219 Benign neoplasm of connective and other soft tissue, unspecified: Secondary | ICD-10-CM

## 2018-09-23 DIAGNOSIS — R102 Pelvic and perineal pain: Secondary | ICD-10-CM

## 2018-09-23 DIAGNOSIS — N946 Dysmenorrhea, unspecified: Secondary | ICD-10-CM | POA: Diagnosis not present

## 2018-09-23 NOTE — Progress Notes (Signed)
  Patient identified with two patient identifiers and agrees to webex. Kathrene Alu RN  Patient is cramping and nauseated before her cycles starts. Patient is having LLQ pain.  Patient period was 09/16/2018 to 09/19/2018.

## 2018-09-23 NOTE — Progress Notes (Signed)
   TELE[facility]ALTH [place] VISIT ENCOUNTER NOTE  I connected with Katherine Mcfarland on 09/23/18 at 10:45 AM EDT by WebEx at home and verified that I am speaking with the correct person using two identifiers.   I discussed the limitations, risks, security and privacy concerns of performing an evaluation and management service by telephone and the availability of in person appointments. I also discussed with the patient that there may be a patient responsible charge related to this service. The patient expressed understanding and agreed to proceed.   History:  Katherine Mcfarland is a 33 y.o. [phone] female being evaluated today for cramping post menses.  She denies any abnormal vaginal discharge, bleeding, pelvic pain or other concerns.  LMP 09/16/2018   Pt with no new sexual partners . No concern of STIs. No contraception. On PNVs.    Past Medical History:  Diagnosis Date  . Allergy    pt. reports seasonal allergies  . Headache   . Lactose intolerance   . Palpitations   . Right knee pain   . Tinnitus    Past Surgical History:  Procedure Laterality Date  . CESAREAN SECTION    . WISDOM TOOTH EXTRACTION     The following portions of the patient's history were reviewed and updated as appropriate: allergies, current medications, past family history, past medical history, past social history, past surgical history and problem list.   Health Maintenance:  Normal pap and negative HRHPV on 10/23/2017.     Review of Systems:  Pertinent items noted in HPI and remainder of comprehensive ROS otherwise negative.  Physical Exam:   General:  Alert, oriented and cooperative. Patient appears to be in no acute distress.  Mental Status: Normal mood and affect. Normal behavior. Normal judgment and thought content.   Respiratory: Normal respiratory effort, no problems with respiration noted  Rest of physical exam deferred due to type of encounter   Assessment and Plan:     Cramping post menses. No  meds taken at this point.   Rec NSAIDS prn  I have reviewed the [place] with the pt  Fibroids  Small and probably not related to current sx   Dysmenorrhea  Pt has a h/o dysmenorrhea and she takes Diclofenac during her cycles.          I discussed the assessment and treatment plan with the patient. The patient was provided an opportunity to ask questions and all were answered. The patient agreed with the plan and demonstrated an understanding of the instructions.   The patient was advised to call back or seek an in-person evaluation/go to the ED if the symptoms worsen or if the condition fails to improve as anticipated.  I provided 15 minutes of face-to-face via WebEx time during this encounter.   Lavonia Drafts, MD [facility] [place]

## 2018-09-29 ENCOUNTER — Encounter: Payer: Self-pay | Admitting: Family Medicine

## 2018-09-29 ENCOUNTER — Other Ambulatory Visit: Payer: Self-pay

## 2018-09-29 ENCOUNTER — Ambulatory Visit (INDEPENDENT_AMBULATORY_CARE_PROVIDER_SITE_OTHER): Payer: 59 | Admitting: Family Medicine

## 2018-09-29 VITALS — BP 136/84 | HR 89

## 2018-09-29 DIAGNOSIS — J0140 Acute pansinusitis, unspecified: Secondary | ICD-10-CM | POA: Diagnosis not present

## 2018-09-29 MED ORDER — CEFUROXIME AXETIL 250 MG PO TABS: 250.0000 mg | ORAL_TABLET | Freq: Two times a day (BID) | ORAL | 0 refills | Status: DC

## 2018-09-29 MED ORDER — PREDNISONE 10 MG PO TABS: ORAL_TABLET | ORAL | 0 refills | Status: DC

## 2018-09-29 NOTE — Progress Notes (Signed)
Virtual Visit via Video Note  I connected with Katherine Mcfarland on 09/29/18 at  1:15 PM EDT by a video enabled telemedicine application and verified that I am speaking with the correct person using two identifiers.  Location: Patient: home Provider: home   I discussed the limitations of evaluation and management by telemedicine and the availability of in person appointments. The patient expressed understanding and agreed to proceed.  History of Present Illness: Pt c/o sinus pressure / congestion since sat.  She has tried sudafed .  Tylenol sinus with no relief   She is using nasacort ,  zyzal and singular daily .  No fever,  No chillse     Past Medical History:  Diagnosis Date  . Allergy    pt. reports seasonal allergies  . Headache   . Lactose intolerance   . Palpitations   . Right knee pain   . Tinnitus    Current Outpatient Medications on File Prior to Visit  Medication Sig Dispense Refill  . diclofenac (CATAFLAM) 50 MG tablet Take 1 tablet (50 mg total) by mouth 3 (three) times daily with meals as needed. (Patient not taking: Reported on 09/23/2018) 90 tablet 2  . levocetirizine (XYZAL) 5 MG tablet Take 1 tablet (5 mg total) by mouth every evening. 90 tablet 3  . moxifloxacin (VIGAMOX) 0.5 % ophthalmic solution Place 1 drop into the right eye 3 (three) times daily. 3 mL 0  . Phentermine-Topiramate (QSYMIA) 3.75-23 MG CP24 1 po qam (Patient not taking: Reported on 09/23/2018) 30 capsule 0  . tretinoin (RETIN-A) 0.025 % cream Apply topically at bedtime. 45 g 3  . triamcinolone (NASACORT) 55 MCG/ACT AERO nasal inhaler Place 2 sprays into the nose daily. 1 Inhaler 12  . Vitamin D, Ergocalciferol, (DRISDOL) 1.25 MG (50000 UT) CAPS capsule Take 1 capsule (50,000 Units total) by mouth every 7 (seven) days. 12 capsule 0   No current facility-administered medications on file prior to visit.     Observations/Objective: Afebrile ,    Vitals:   09/29/18 1304  BP: 136/84  Pulse: 89    Pt in NAD No sob, rr normal    Assessment and Plan: 1. Acute pansinusitis, recurrence not specified con't nasacort , xyzal and singular  Add pred If no better in 4-5 days take abx  - predniSONE (DELTASONE) 10 MG tablet; TAKE 3 TABLETS PO QD FOR 3 DAYS THEN TAKE 2 TABLETS PO QD FOR 3 DAYS THEN TAKE 1 TABLET PO QD FOR 3 DAYS THEN TAKE 1/2 TAB PO QD FOR 3 DAYS  Dispense: 20 tablet; Refill: 0 - cefUROXime (CEFTIN) 250 MG tablet; Take 1 tablet (250 mg total) by mouth 2 (two) times daily with a meal.  Dispense: 20 tablet; Refill: 0   Follow Up Instructions:    I discussed the assessment and treatment plan with the patient. The patient was provided an opportunity to ask questions and all were answered. The patient agreed with the plan and demonstrated an understanding of the instructions.   The patient was advised to call back or seek an in-person evaluation if the symptoms worsen or if the condition fails to improve as anticipated.  I provided 15 minutes of non-face-to-face time during this encounter.   Ann Held, DO

## 2018-09-29 NOTE — Telephone Encounter (Signed)
Pt had virtual visit===== pt told to stay home tomorrow

## 2018-10-10 ENCOUNTER — Encounter: Payer: Self-pay | Admitting: Family Medicine

## 2018-10-14 ENCOUNTER — Ambulatory Visit (INDEPENDENT_AMBULATORY_CARE_PROVIDER_SITE_OTHER): Payer: 59 | Admitting: Family Medicine

## 2018-10-14 ENCOUNTER — Other Ambulatory Visit: Payer: Self-pay

## 2018-10-14 ENCOUNTER — Encounter: Payer: Self-pay | Admitting: Family Medicine

## 2018-10-14 DIAGNOSIS — Z79899 Other long term (current) drug therapy: Secondary | ICD-10-CM | POA: Diagnosis not present

## 2018-10-14 MED ORDER — PHENTERMINE HCL 37.5 MG PO CAPS: 37.5000 mg | ORAL_CAPSULE | ORAL | 0 refills | Status: DC

## 2018-10-14 MED FILL — PHENTERMINE 37.5 MG CAPSULE: 37.5 | 30 days supply | Qty: 30 | Fill #0

## 2018-10-14 NOTE — Progress Notes (Signed)
Patient ID: Katherine Mcfarland, female    DOB: 1986-03-26  Age: 33 y.o. MRN: [id number]    Subjective:  Subjective     Katherine Mcfarland presents to f/u her struggle with weight loss she would like to try phentermine again.  She is walking and watching her diet   She is following the same diet she was on when she was in healthy weight and wellness.    Review of Systems Review of Systems  Constitutional: Negative for activity change, appetite change and fatigue.  HENT: Negative for hearing loss, congestion, tinnitus and ear discharge.  dentist q48m Eyes: Negative for visual disturbance (see optho q1y -- vision corrected to 20/20 with glasses).  Respiratory: Negative for cough, chest tightness and shortness of breath.   Cardiovascular: Negative for chest pain, palpitations and leg swelling.  Gastrointestinal: Negative for abdominal pain, diarrhea, constipation and abdominal distention.  Genitourinary: Negative for urgency, frequency, decreased urine volume and difficulty urinating.  Musculoskeletal: Negative for back pain, arthralgias and gait problem.  Skin: Negative for color change, pallor and rash.  Neurological: Negative for dizziness, light-headedness, numbness and headaches.  Hematological: Negative for adenopathy. Does not bruise/bleed easily.  Psychiatric/Behavioral: Negative for suicidal ideas, confusion, sleep disturbance, self-injury, dysphoric mood, decreased concentration and agitation.      History Past Medical History:  Diagnosis Date  . Allergy    pt. reports seasonal allergies  . Headache   . Lactose intolerance   . Palpitations   . Right knee pain   . Tinnitus     She has a past surgical history that includes Cesarean section and Wisdom tooth extraction.   Her family history includes Diabetes in her maternal grandmother; Hyperlipidemia in her mother; Hypertension in her father, maternal grandmother, and mother.She reports that she has never smoked. She has never  used smokeless tobacco. She reports current alcohol use. She reports that she does not use drugs.  Current Outpatient Medications on File Prior to Visit  Medication Sig Dispense Refill  . cefUROXime (CEFTIN) 250 MG tablet Take 1 tablet (250 mg total) by mouth 2 (two) times daily with a meal. 20 tablet 0  . diclofenac (CATAFLAM) 50 MG tablet Take 1 tablet (50 mg total) by mouth 3 (three) times daily with meals as needed. (Patient not taking: Reported on 09/23/2018) 90 tablet 2  . levocetirizine (XYZAL) 5 MG tablet Take 1 tablet (5 mg total) by mouth every evening. 90 tablet 3  . moxifloxacin (VIGAMOX) 0.5 % ophthalmic solution Place 1 drop into the right eye 3 (three) times daily. 3 mL 0  . Phentermine-Topiramate (QSYMIA) 3.75-23 MG CP24 1 po qam (Patient not taking: Reported on 09/23/2018) 30 capsule 0  . predniSONE (DELTASONE) 10 MG tablet TAKE 3 TABLETS PO QD FOR 3 DAYS THEN TAKE 2 TABLETS PO QD FOR 3 DAYS THEN TAKE 1 TABLET PO QD FOR 3 DAYS THEN TAKE 1/2 TAB PO QD FOR 3 DAYS 20 tablet 0  . tretinoin (RETIN-A) 0.025 % cream Apply topically at bedtime. 45 g 3  . triamcinolone (NASACORT) 55 MCG/ACT AERO nasal inhaler Place 2 sprays into the nose daily. 1 Inhaler 12  . Vitamin D, Ergocalciferol, (DRISDOL) 1.25 MG (50000 UT) CAPS capsule Take 1 capsule (50,000 Units total) by mouth every 7 (seven) days. 12 capsule 0   No current facility-administered medications on file prior to visit.      Objective:  Objective  Physical Exam Vitals signs and nursing note reviewed.  Constitutional:  General: She is not in acute distress.    Appearance: She is well-developed.  HENT:     Head: Normocephalic and atraumatic.     Right Ear: External ear normal.     Left Ear: External ear normal.     Nose: Nose normal.  Eyes:     Conjunctiva/sclera: Conjunctivae normal.     Pupils: Pupils are equal, round, and reactive to light.  Neck:     Musculoskeletal: Normal range of motion and neck supple.      Thyroid: No thyromegaly.     Vascular: No carotid bruit or JVD.  Cardiovascular:     Rate and Rhythm: Normal rate and regular rhythm.     Heart sounds: Normal heart sounds. No murmur.  Pulmonary:     Effort: Pulmonary effort is normal. No respiratory distress.     Breath sounds: Normal breath sounds. No wheezing or rales.  Chest:     Chest wall: No tenderness.  Neurological:     Mental Status: She is alert and oriented to person, place, and time.  Psychiatric:        Behavior: Behavior normal.        Thought Content: Thought content normal.        Judgment: Judgment normal.    BP (!) 134/95 (BP Location: Left Arm, Cuff Size: Large)   Pulse 100   Temp 98.6 F (37 C) (Oral)   Resp 12   LMP 09/16/2018 (Exact Date)   SpO2 100%  Wt Readings from Last 3 Encounters:  09/23/18 240 lb (108.9 kg)  09/19/18 230 lb (104.3 kg)  07/18/18 233 lb (105.7 kg)     Lab Results  Component Value Date   WBC 6.7 07/18/2018   HGB 11.1 (L) 07/18/2018   HCT 34.6 (L) 07/18/2018   PLT 328 07/18/2018   GLUCOSE 123 (H) 07/18/2018   CHOL 178 10/18/2017   TRIG 120 10/18/2017   HDL 46 (L) 10/18/2017   LDLCALC 109 (H) 10/18/2017   ALT 12 07/18/2018   AST 16 07/18/2018   NA 137 07/18/2018   K 4.2 07/18/2018   CL 102 07/18/2018   CREATININE 0.83 07/18/2018   BUN 15 07/18/2018   CO2 24 07/18/2018   TSH 1.39 10/18/2017   HGBA1C 6.1 (H) 10/18/2017    No results found.   Assessment & Plan:  Plan  I am having Nijae A. Joshi start on phentermine. I am also having her maintain her tretinoin, diclofenac, Phentermine-Topiramate, Vitamin D (Ergocalciferol), triamcinolone, levocetirizine, moxifloxacin, predniSONE, and cefUROXime.  Meds ordered this encounter  Medications  . phentermine 37.5 MG capsule    Sig: Take 1 capsule (37.5 mg total) by mouth every morning.    Dispense:  30 capsule    Refill:  0   ekg-- nsr  Problem List Items Addressed This Visit      Unprioritized   Morbid obesity  ([place]) - Primary   Relevant Medications   phentermine 37.5 MG capsule    Other Visit Diagnoses    High risk medication use       Relevant Orders   EKG 12-Lead (Completed)    con't with diet and exercise F/u 1 month  Follow-up: Return in about 4 weeks (around 11/11/2018), or if symptoms worsen or fail to improve.  Ann Held, DO

## 2018-10-21 MED ORDER — PREDNISONE 10 MG PO TABS: ORAL_TABLET | ORAL | 0 refills | Status: DC

## 2018-10-21 MED ORDER — CEFUROXIME AXETIL 250 MG PO TABS: 250.0000 mg | ORAL_TABLET | Freq: Two times a day (BID) | ORAL | 0 refills | Status: DC

## 2018-10-21 MED FILL — predniSONE 10 MG TABS: 10 | 12 days supply | Qty: 20 | Fill #0

## 2018-10-21 MED FILL — CEFUROXIME AXETIL 500 MG TA: 500 | 10 days supply | Qty: 10 | Fill #0

## 2018-10-21 NOTE — Addendum Note (Signed)
Addended by: Wynonia Musty A on: 10/21/2018 12:07 PM   Modules accepted: Orders

## 2018-10-29 ENCOUNTER — Ambulatory Visit (INDEPENDENT_AMBULATORY_CARE_PROVIDER_SITE_OTHER): Payer: 59 | Admitting: Obstetrics & Gynecology

## 2018-10-29 ENCOUNTER — Encounter: Payer: Self-pay | Admitting: Obstetrics & Gynecology

## 2018-10-29 ENCOUNTER — Other Ambulatory Visit: Payer: Self-pay

## 2018-10-29 VITALS — BP 128/84 | HR 96

## 2018-10-29 DIAGNOSIS — S3141XA Laceration without foreign body of vagina and vulva, initial encounter: Secondary | ICD-10-CM | POA: Diagnosis not present

## 2018-10-29 NOTE — Progress Notes (Signed)
History:  33 y.o. [id number] here today for eval of vulvar lesion. She reports that her partner saw a 'hole' in her vulva. She reports that she thinks she cur herself while shaving. Pt denies assoc sx such as bleeding or abnormal discharge.   The following portions of the patient's history were reviewed and updated as appropriate: allergies, current medications, past family history, past medical history, past social history, past surgical history and problem list.  Review of Systems:  Pertinent items are noted in HPI.    Objective:  Physical Exam Blood pressure 128/84, pulse 96.  CONSTITUTIONAL: Well-developed, well-nourished female in no acute distress.  HENT:  Normocephalic, atraumatic EYES: Conjunctivae and EOM are normal. No scleral icterus.  NECK: Normal range of motion SKIN: Skin is warm and dry. No rash noted. Not diaphoretic.No pallor. [place]: Alert and oriented to person, place, and time. Normal coordination.  Pelvic: there is laceration at the fourchette. It is at an area that still has hair around it. There are no vesicals or other lesions. There is no lymphadenopathy noted.    Assessment & Plan:  Injury/laceration from trauma (namely shaving)  rec keeping area clean and dry   No further f/u needed for this.  F/u for annual or sooner prn  Total face-to-face time with patient was 15 min.  Greater than 50% was spent in counseling and coordination of care with the patient.

## 2018-10-31 ENCOUNTER — Encounter: Payer: Self-pay | Admitting: Obstetrics & Gynecology

## 2018-11-07 ENCOUNTER — Encounter: Payer: Self-pay | Admitting: Family Medicine

## 2018-11-07 ENCOUNTER — Ambulatory Visit: Payer: 59 | Admitting: Family Medicine

## 2018-11-07 ENCOUNTER — Other Ambulatory Visit: Payer: Self-pay

## 2018-11-07 ENCOUNTER — Ambulatory Visit (INDEPENDENT_AMBULATORY_CARE_PROVIDER_SITE_OTHER): Payer: 59 | Admitting: Family Medicine

## 2018-11-07 VITALS — BP 122/84 | HR 81 | Temp 98.6°F | Resp 16 | Ht 66.0 in | Wt 240.0 lb

## 2018-11-07 DIAGNOSIS — R1084 Generalized abdominal pain: Secondary | ICD-10-CM

## 2018-11-07 NOTE — Progress Notes (Signed)
Patient ID: Katherine Mcfarland, female    DOB: Feb 12, 1986  Age: 33 y.o. MRN: [id number]    Subjective:  Subjective  HPI Katherine Mcfarland presents for c/o abd pain --- generalized .   She started the pepcid yesterday  Pain is all over.  It is not bothering her right now but hurts every time she eats.   No vomiting  + nausea No fever   Review of Systems  Constitutional: Negative for appetite change, diaphoresis, fatigue and unexpected weight change.  Eyes: Negative for pain, redness and visual disturbance.  Respiratory: Negative for cough, chest tightness, shortness of breath and wheezing.   Cardiovascular: Negative for chest pain, palpitations and leg swelling.  Gastrointestinal: Positive for abdominal pain and nausea. Negative for abdominal distention, blood in stool, constipation, diarrhea and vomiting.  Endocrine: Negative for cold intolerance, heat intolerance, polydipsia, polyphagia and polyuria.  Genitourinary: Negative for difficulty urinating, dysuria and frequency.  Neurological: Negative for dizziness, light-headedness, numbness and headaches.    History Past Medical History:  Diagnosis Date  . Allergy    pt. reports seasonal allergies  . Headache   . Lactose intolerance   . Palpitations   . Right knee pain   . Tinnitus     She has a past surgical history that includes Cesarean section and Wisdom tooth extraction.   Her family history includes Diabetes in her maternal grandmother; Hyperlipidemia in her mother; Hypertension in her father, maternal grandmother, and mother.She reports that she has never smoked. She has never used smokeless tobacco. She reports current alcohol use. She reports that she does not use drugs.  Current Outpatient Medications on File Prior to Visit  Medication Sig Dispense Refill  . cefUROXime (CEFTIN) 250 MG tablet Take 1 tablet (250 mg total) by mouth 2 (two) times daily with a meal. 20 tablet 0  . diclofenac (CATAFLAM) 50 MG tablet Take 1  tablet (50 mg total) by mouth 3 (three) times daily with meals as needed. (Patient not taking: Reported on 09/23/2018) 90 tablet 2  . levocetirizine (XYZAL) 5 MG tablet Take 1 tablet (5 mg total) by mouth every evening. 90 tablet 3  . phentermine 37.5 MG capsule Take 1 capsule (37.5 mg total) by mouth every morning. 30 capsule 0  . Phentermine-Topiramate (QSYMIA) 3.75-23 MG CP24 1 po qam (Patient not taking: Reported on 09/23/2018) 30 capsule 0  . tretinoin (RETIN-A) 0.025 % cream Apply topically at bedtime. 45 g 3  . triamcinolone (NASACORT) 55 MCG/ACT AERO nasal inhaler Place 2 sprays into the nose daily. 1 Inhaler 12  . Vitamin D, Ergocalciferol, (DRISDOL) 1.25 MG (50000 UT) CAPS capsule Take 1 capsule (50,000 Units total) by mouth every 7 (seven) days. (Patient not taking: Reported on 10/29/2018) 12 capsule 0   No current facility-administered medications on file prior to visit.      Objective:  Objective  Physical Exam Vitals signs and nursing note reviewed.  Constitutional:      Appearance: She is well-developed.  HENT:     Head: Normocephalic and atraumatic.  Eyes:     Conjunctiva/sclera: Conjunctivae normal.  Neck:     Musculoskeletal: Normal range of motion and neck supple.     Thyroid: No thyromegaly.     Vascular: No carotid bruit or JVD.  Cardiovascular:     Rate and Rhythm: Normal rate and regular rhythm.     Heart sounds: Normal heart sounds. No murmur.  Pulmonary:     Effort: Pulmonary effort is normal. No respiratory  distress.     Breath sounds: Normal breath sounds. No wheezing or rales.  Chest:     Chest wall: No tenderness.  Abdominal:     General: Bowel sounds are normal. There is no distension.     Palpations: Abdomen is soft. There is no mass.     Tenderness: There is no abdominal tenderness. There is no guarding.  Neurological:     Mental Status: She is alert and oriented to person, place, and time.    BP 122/84 (BP Location: Right Arm, Patient Position:  Sitting, Cuff Size: Large)   Pulse 81   Temp 98.6 F (37 C) (Oral)   Resp 16   Ht 5\' 6"  (1.676 m)   Wt 240 lb (108.9 kg)   LMP 11/03/2018 (Exact Date)   SpO2 99%   BMI 38.74 kg/m  Wt Readings from Last 3 Encounters:  11/07/18 240 lb (108.9 kg)  09/23/18 240 lb (108.9 kg)  09/19/18 230 lb (104.3 kg)     Lab Results  Component Value Date   WBC 6.7 07/18/2018   HGB 11.1 (L) 07/18/2018   HCT 34.6 (L) 07/18/2018   PLT 328 07/18/2018   GLUCOSE 123 (H) 07/18/2018   CHOL 178 10/18/2017   TRIG 120 10/18/2017   HDL 46 (L) 10/18/2017   LDLCALC 109 (H) 10/18/2017   ALT 12 07/18/2018   AST 16 07/18/2018   NA 137 07/18/2018   K 4.2 07/18/2018   CL 102 07/18/2018   CREATININE 0.83 07/18/2018   BUN 15 07/18/2018   CO2 24 07/18/2018   TSH 1.39 10/18/2017   HGBA1C 6.1 (H) 10/18/2017    No results found.   Assessment & Plan:  Plan  I am having Katherine Mcfarland maintain her tretinoin, diclofenac, Phentermine-Topiramate, Vitamin D (Ergocalciferol), triamcinolone, levocetirizine, phentermine, and cefUROXime.  No orders of the defined types were placed in this encounter.   Problem List Items Addressed This Visit    None    Visit Diagnoses    Generalized abdominal pain    -  Primary   Relevant Orders   H. pylori antibody, IgG   CBC with Differential/Platelet   Comprehensive metabolic panel   Amylase   Lipase    take pepcid bid Check labs Monday Consider GI referral  Follow-up: Return if symptoms worsen or fail to improve.  Ann Held, DO    ------------

## 2018-11-07 NOTE — Patient Instructions (Signed)

## 2018-11-10 ENCOUNTER — Other Ambulatory Visit: Payer: Self-pay

## 2018-11-10 ENCOUNTER — Other Ambulatory Visit: Payer: Self-pay | Admitting: Family Medicine

## 2018-11-10 ENCOUNTER — Telehealth: Payer: Self-pay | Admitting: Family Medicine

## 2018-11-10 DIAGNOSIS — Z20822 Contact with and (suspected) exposure to covid-19: Secondary | ICD-10-CM

## 2018-11-10 NOTE — Telephone Encounter (Signed)
Spoke with patient, scheduled her for appointment at [facility] today at 12:30 pm.  Testing protocol reviewed.

## 2018-11-10 NOTE — Telephone Encounter (Signed)
Pt is a cma----  C/o headache and fatigue, nausea     No fever

## 2018-11-13 ENCOUNTER — Ambulatory Visit (INDEPENDENT_AMBULATORY_CARE_PROVIDER_SITE_OTHER): Payer: 59 | Admitting: Family Medicine

## 2018-11-13 ENCOUNTER — Ambulatory Visit: Payer: 59 | Admitting: Family Medicine

## 2018-11-13 ENCOUNTER — Encounter: Payer: Self-pay | Admitting: Family Medicine

## 2018-11-13 ENCOUNTER — Other Ambulatory Visit: Payer: Self-pay

## 2018-11-13 DIAGNOSIS — J0140 Acute pansinusitis, unspecified: Secondary | ICD-10-CM | POA: Diagnosis not present

## 2018-11-13 MED ORDER — DOXYCYCLINE HYCLATE 100 MG PO TABS: 100.0000 mg | ORAL_TABLET | Freq: Two times a day (BID) | ORAL | 0 refills | Status: DC

## 2018-11-13 NOTE — Patient Instructions (Signed)
Sinusitis, Adult Sinusitis is inflammation of your sinuses. Sinuses are hollow spaces in the bones around your face. Your sinuses are located:  Around your eyes.  In the middle of your forehead.  Behind your nose.  In your cheekbones. Mucus normally drains out of your sinuses. When your nasal tissues become inflamed or swollen, mucus can become trapped or blocked. This allows bacteria, viruses, and fungi to grow, which leads to infection. Most infections of the sinuses are caused by a virus. Sinusitis can develop quickly. It can last for up to 4 weeks (acute) or for more than 12 weeks (chronic). Sinusitis often develops after a cold. What are the causes? This condition is caused by anything that creates swelling in the sinuses or stops mucus from draining. This includes:  Allergies.  Asthma.  Infection from bacteria or viruses.  Deformities or blockages in your nose or sinuses.  Abnormal growths in the nose (nasal polyps).  Pollutants, such as chemicals or irritants in the air.  Infection from fungi (rare). What increases the risk? You are more likely to develop this condition if you:  Have a weak body defense system (immune system).  Do a lot of swimming or diving.  Overuse nasal sprays.  Smoke. What are the signs or symptoms? The main symptoms of this condition are pain and a feeling of pressure around the affected sinuses. Other symptoms include:  Stuffy nose or congestion.  Thick drainage from your nose.  Swelling and warmth over the affected sinuses.  Headache.  Upper toothache.  A cough that may get worse at night.  Extra mucus that collects in the throat or the back of the nose (postnasal drip).  Decreased sense of smell and taste.  Fatigue.  A fever.  Sore throat.  Bad breath. How is this diagnosed? This condition is diagnosed based on:  Your symptoms.  Your medical history.  A physical exam.  Tests to find out if your condition is  acute or chronic. This may include: ? Checking your nose for nasal polyps. ? Viewing your sinuses using a device that has a light (endoscope). ? Testing for allergies or bacteria. ? Imaging tests, such as an MRI or CT scan. In rare cases, a bone biopsy may be done to rule out more serious types of fungal sinus disease. How is this treated? Treatment for sinusitis depends on the cause and whether your condition is chronic or acute.  If caused by a virus, your symptoms should go away on their own within 10 days. You may be given medicines to relieve symptoms. They include: ? Medicines that shrink swollen nasal passages (topical intranasal decongestants). ? Medicines that treat allergies (antihistamines). ? A spray that eases inflammation of the nostrils (topical intranasal corticosteroids). ? Rinses that help get rid of thick mucus in your nose (nasal saline washes).  If caused by bacteria, your health care provider may recommend waiting to see if your symptoms improve. Most bacterial infections will get better without antibiotic medicine. You may be given antibiotics if you have: ? A severe infection. ? A weak immune system.  If caused by narrow nasal passages or nasal polyps, you may need to have surgery. Follow these instructions at home: Medicines  Take, use, or apply over-the-counter and prescription medicines only as told by your health care provider. These may include nasal sprays.  If you were prescribed an antibiotic medicine, take it as told by your health care provider. Do not stop taking the antibiotic even if you start   to feel better. Hydrate and humidify   Drink enough fluid to keep your urine pale yellow. Staying hydrated will help to thin your mucus.  Use a cool mist humidifier to keep the humidity level in your home above 50%.  Inhale steam for 10-15 minutes, 3-4 times a day, or as told by your health care provider. You can do this in the bathroom while a hot shower is  running.  Limit your exposure to cool or dry air. Rest  Rest as much as possible.  Sleep with your head raised (elevated).  Make sure you get enough sleep each night. General instructions   Apply a warm, moist washcloth to your face 3-4 times a day or as told by your health care provider. This will help with discomfort.  Wash your hands often with soap and water to reduce your exposure to germs. If soap and water are not available, use hand sanitizer.  Do not smoke. Avoid being around people who are smoking (secondhand smoke).  Keep all follow-up visits as told by your health care provider. This is important. Contact a health care provider if:  You have a fever.  Your symptoms get worse.  Your symptoms do not improve within 10 days. Get help right away if:  You have a severe headache.  You have persistent vomiting.  You have severe pain or swelling around your face or eyes.  You have vision problems.  You develop confusion.  Your neck is stiff.  You have trouble breathing. Summary  Sinusitis is soreness and inflammation of your sinuses. Sinuses are hollow spaces in the bones around your face.  This condition is caused by nasal tissues that become inflamed or swollen. The swelling traps or blocks the flow of mucus. This allows bacteria, viruses, and fungi to grow, which leads to infection.  If you were prescribed an antibiotic medicine, take it as told by your health care provider. Do not stop taking the antibiotic even if you start to feel better.  Keep all follow-up visits as told by your health care provider. This is important. This information is not intended to replace advice given to you by your health care provider. Make sure you discuss any questions you have with your health care provider. Document Released: 04/30/2005 Document Revised: 09/30/2017 Document Reviewed: 09/30/2017 Elsevier Patient Education  2020 [employer]  

## 2018-11-13 NOTE — Progress Notes (Signed)
Virtual Visit via Video Note  I connected with Katherine Mcfarland on 11/13/18 at 11:30 AM EDT by a video enabled telemedicine application and verified that I am speaking with the correct person using two identifiers.  Location: Patient: home Provider: office   I discussed the limitations of evaluation and management by telemedicine and the availability of in person appointments. The patient expressed understanding and agreed to proceed.  History of Present Illness: Pt is home and needs f/u sinus headaches.  She was tx with ceftin and pred several weeks ago and it helped but symptoms came right back.  She was tested for covid and it was neg but was re tested today N/P-- last test was nasal swab  Pt is congested , no fevers still.     Observations/Objective: No vitals obtained Pt in NAD  Assessment and Plan: 1. Acute pansinusitis, recurrence not specified con't nasacort and xyzal  abx per orders covid pending  - doxycycline (VIBRA-TABS) 100 MG tablet; Take 1 tablet (100 mg total) by mouth 2 (two) times daily.  Dispense: 20 tablet; Refill: 0   Follow Up Instructions:    I discussed the assessment and treatment plan with the patient. The patient was provided an opportunity to ask questions and all were answered. The patient agreed with the plan and demonstrated an understanding of the instructions.   The patient was advised to call back or seek an in-person evaluation if the symptoms worsen or if the condition fails to improve as anticipated.  I provided 15 minutes of non-face-to-face time during this encounter.   Ann Held, DO

## 2018-11-17 ENCOUNTER — Other Ambulatory Visit: Payer: Self-pay

## 2018-11-17 DIAGNOSIS — J302 Other seasonal allergic rhinitis: Secondary | ICD-10-CM

## 2018-11-17 MED ORDER — FAMOTIDINE 40 MG PO TABS: 40.0000 mg | ORAL_TABLET | Freq: Every day | ORAL | 1 refills | Status: DC

## 2018-11-17 MED ORDER — LEVOCETIRIZINE DIHYDROCHLORIDE 5 MG PO TABS: 5.0000 mg | ORAL_TABLET | Freq: Every evening | ORAL | 3 refills | Status: DC

## 2018-11-17 MED ORDER — PRENATAL/IRON PO TABS: 1.0000 | ORAL_TABLET | Freq: Every day | ORAL | 3 refills | Status: DC

## 2018-11-17 MED FILL — LEVOCETIRIZINE 5 MG TABLET: 5 | 90 days supply | Qty: 90 | Fill #0

## 2018-11-17 MED FILL — PRENATAL VITAMIN PLUS LOW I: 27-1 | 90 days supply | Qty: 90 | Fill #0

## 2018-11-17 MED FILL — FAMOTIDINE 40 MG TABLET: 40 | 30 days supply | Qty: 30 | Fill #0

## 2018-11-18 ENCOUNTER — Other Ambulatory Visit (INDEPENDENT_AMBULATORY_CARE_PROVIDER_SITE_OTHER): Payer: 59

## 2018-11-18 ENCOUNTER — Other Ambulatory Visit: Payer: Self-pay

## 2018-11-18 DIAGNOSIS — R739 Hyperglycemia, unspecified: Secondary | ICD-10-CM | POA: Diagnosis not present

## 2018-11-18 DIAGNOSIS — E559 Vitamin D deficiency, unspecified: Secondary | ICD-10-CM

## 2018-11-18 DIAGNOSIS — R1084 Generalized abdominal pain: Secondary | ICD-10-CM

## 2018-11-18 LAB — CBC WITH DIFFERENTIAL/PLATELET
Basophils Absolute: 0.1 10*3/uL (ref 0.0–0.1)
Basophils Relative: 1.5 % (ref 0.0–3.0)
Eosinophils Absolute: 0.3 10*3/uL (ref 0.0–0.7)
Eosinophils Relative: 4.9 % (ref 0.0–5.0)
HCT: 36 % (ref 36.0–46.0)
Hemoglobin: 11.6 g/dL — ABNORMAL LOW (ref 12.0–15.0)
Lymphocytes Relative: 47.1 % — ABNORMAL HIGH (ref 12.0–46.0)
Lymphs Abs: 2.9 10*3/uL (ref 0.7–4.0)
MCHC: 32.2 g/dL (ref 30.0–36.0)
MCV: 83.4 fl (ref 78.0–100.0)
Monocytes Absolute: 0.5 10*3/uL (ref 0.1–1.0)
Monocytes Relative: 7.7 % (ref 3.0–12.0)
Neutro Abs: 2.4 10*3/uL (ref 1.4–7.7)
Neutrophils Relative %: 38.8 % — ABNORMAL LOW (ref 43.0–77.0)
Platelets: 325 10*3/uL (ref 150.0–400.0)
RBC: 4.32 Mil/uL (ref 3.87–5.11)
RDW: 15.2 % (ref 11.5–15.5)
WBC: 6.1 10*3/uL (ref 4.0–10.5)

## 2018-11-18 LAB — COMPREHENSIVE METABOLIC PANEL
ALT: 20 U/L (ref 0–35)
AST: 17 U/L (ref 0–37)
Albumin: 4.1 g/dL (ref 3.5–5.2)
Alkaline Phosphatase: 63 U/L (ref 39–117)
BUN: 13 mg/dL (ref 6–23)
CO2: 26 mEq/L (ref 19–32)
Calcium: 8.8 mg/dL (ref 8.4–10.5)
Chloride: 102 mEq/L (ref 96–112)
Creatinine, Ser: 0.79 mg/dL (ref 0.40–1.20)
GFR: 100 mL/min (ref >60.00)
Glucose, Bld: 91 mg/dL (ref 70–99)
Potassium: 4 mEq/L (ref 3.5–5.1)
Sodium: 135 mEq/L (ref 135–145)
Total Bilirubin: 0.2 mg/dL (ref 0.2–1.2)
Total Protein: 7.1 g/dL (ref 6.0–8.3)

## 2018-11-18 LAB — H. PYLORI ANTIBODY, IGG: H Pylori IgG: NEGATIVE

## 2018-11-18 LAB — VITAMIN D 25 HYDROXY (VIT D DEFICIENCY, FRACTURES): VITD: 16.65 ng/mL — ABNORMAL LOW (ref 30.00–100.00)

## 2018-11-18 LAB — AMYLASE: Amylase: 64 U/L (ref 27–131)

## 2018-11-18 LAB — LIPASE: Lipase: 65 U/L — ABNORMAL HIGH (ref 11.0–59.0)

## 2018-11-18 LAB — HEMOGLOBIN A1C: Hgb A1c MFr Bld: 6.4 % (ref 4.6–6.5)

## 2018-11-19 ENCOUNTER — Other Ambulatory Visit: Payer: Self-pay

## 2018-11-19 DIAGNOSIS — E559 Vitamin D deficiency, unspecified: Secondary | ICD-10-CM

## 2018-11-19 MED ORDER — VITAMIN D (ERGOCALCIFEROL) 1.25 MG (50000 UNIT) PO CAPS: 50000.0000 [IU] | ORAL_CAPSULE | ORAL | 4 refills | Status: DC

## 2018-11-19 MED FILL — VIT D2 1.25 MG (50,000 UNIT: 1.25 MG | 84 days supply | Qty: 12 | Fill #0

## 2018-12-02 ENCOUNTER — Encounter: Payer: Self-pay | Admitting: Family Medicine

## 2018-12-02 ENCOUNTER — Other Ambulatory Visit: Payer: Self-pay

## 2018-12-02 ENCOUNTER — Ambulatory Visit (INDEPENDENT_AMBULATORY_CARE_PROVIDER_SITE_OTHER): Payer: 59 | Admitting: Family Medicine

## 2018-12-02 DIAGNOSIS — Z0000 Encounter for general adult medical examination without abnormal findings: Secondary | ICD-10-CM

## 2018-12-02 LAB — LIPID PANEL
Cholesterol: 175 mg/dL (ref 0–200)
HDL: 45 mg/dL (ref >39.00)
LDL Cholesterol: 107 mg/dL — ABNORMAL HIGH (ref 0–99)
NonHDL: 130.1
Total CHOL/HDL Ratio: 4
Triglycerides: 115 mg/dL (ref 0.0–149.0)
VLDL: 23 mg/dL (ref 0.0–40.0)

## 2018-12-02 LAB — CBC WITH DIFFERENTIAL/PLATELET
Basophils Absolute: 0 10*3/uL (ref 0.0–0.1)
Basophils Relative: 0.9 % (ref 0.0–3.0)
Eosinophils Absolute: 0.3 10*3/uL (ref 0.0–0.7)
Eosinophils Relative: 5.8 % — ABNORMAL HIGH (ref 0.0–5.0)
HCT: 37.5 % (ref 36.0–46.0)
Hemoglobin: 12 g/dL (ref 12.0–15.0)
Lymphocytes Relative: 48.1 % — ABNORMAL HIGH (ref 12.0–46.0)
Lymphs Abs: 2.4 10*3/uL (ref 0.7–4.0)
MCHC: 32.1 g/dL (ref 30.0–36.0)
MCV: 82.8 fl (ref 78.0–100.0)
Monocytes Absolute: 0.4 10*3/uL (ref 0.1–1.0)
Monocytes Relative: 7.3 % (ref 3.0–12.0)
Neutro Abs: 1.9 10*3/uL (ref 1.4–7.7)
Neutrophils Relative %: 37.9 % — ABNORMAL LOW (ref 43.0–77.0)
Platelets: 368 10*3/uL (ref 150.0–400.0)
RBC: 4.52 Mil/uL (ref 3.87–5.11)
RDW: 14.4 % (ref 11.5–15.5)
WBC: 5.1 10*3/uL (ref 4.0–10.5)

## 2018-12-02 NOTE — Patient Instructions (Signed)
Preventive Care 21-35 Years Old, Female Preventive care refers to visits with your health care provider and lifestyle choices that can promote health and wellness. This includes:  A yearly physical exam. This may also be called an annual well check.  Regular dental visits and eye exams.  Immunizations.  Screening for certain conditions.  Healthy lifestyle choices, such as eating a healthy diet, getting regular exercise, not using drugs or products that contain nicotine and tobacco, and limiting alcohol use. What can I expect for my preventive care visit? Physical exam Your health care provider will check your:  Height and weight. This may be used to calculate body mass index (BMI), which tells if you are at a healthy weight.  Heart rate and blood pressure.  Skin for abnormal spots. Counseling Your health care provider may ask you questions about your:  Alcohol, tobacco, and drug use.  Emotional well-being.  Home and relationship well-being.  Sexual activity.  Eating habits.  Work and work environment.  Method of birth control.  Menstrual cycle.  Pregnancy history. What immunizations do I need?  Influenza (flu) vaccine  This is recommended every year. Tetanus, diphtheria, and pertussis (Tdap) vaccine  You may need a Td booster every 10 years. Varicella (chickenpox) vaccine  You may need this if you have not been vaccinated. Human papillomavirus (HPV) vaccine  If recommended by your health care provider, you may need three doses over 6 months. Measles, mumps, and rubella (MMR) vaccine  You may need at least one dose of MMR. You may also need a second dose. Meningococcal conjugate (MenACWY) vaccine  One dose is recommended if you are age 19-21 years and a first-year college student living in a residence hall, or if you have one of several medical conditions. You may also need additional booster doses. Pneumococcal conjugate (PCV13) vaccine  You may need  this if you have certain conditions and were not previously vaccinated. Pneumococcal polysaccharide (PPSV23) vaccine  You may need one or two doses if you smoke cigarettes or if you have certain conditions. Hepatitis A vaccine  You may need this if you have certain conditions or if you travel or work in places where you may be exposed to hepatitis A. Hepatitis B vaccine  You may need this if you have certain conditions or if you travel or work in places where you may be exposed to hepatitis B. Haemophilus influenzae type b (Hib) vaccine  You may need this if you have certain conditions. You may receive vaccines as individual doses or as more than one vaccine together in one shot (combination vaccines). Talk with your health care provider about the risks and benefits of combination vaccines. What tests do I need?  Blood tests  Lipid and cholesterol levels. These may be checked every 5 years starting at age 20.  Hepatitis C test.  Hepatitis B test. Screening  Diabetes screening. This is done by checking your blood sugar (glucose) after you have not eaten for a while (fasting).  Sexually transmitted disease (STD) testing.  BRCA-related cancer screening. This may be done if you have a family history of breast, ovarian, tubal, or peritoneal cancers.  Pelvic exam and Pap test. This may be done every 3 years starting at age 21. Starting at age 30, this may be done every 5 years if you have a Pap test in combination with an HPV test. Talk with your health care provider about your test results, treatment options, and if necessary, the need for more tests.   Follow these instructions at home: Eating and drinking   Eat a diet that includes fresh fruits and vegetables, whole grains, lean protein, and low-fat dairy.  Take vitamin and mineral supplements as recommended by your health care provider.  Do not drink alcohol if: ? Your health care provider tells you not to drink. ? You are  pregnant, may be pregnant, or are planning to become pregnant.  If you drink alcohol: ? Limit how much you have to 0-1 drink a day. ? Be aware of how much alcohol is in your drink. In the [place] one drink equals one 12 oz bottle of beer (355 mL), one 5 oz glass of wine (148 mL), or one 1 oz glass of hard liquor (44 mL). Lifestyle  Take daily care of your teeth and gums.  Stay active. Exercise for at least 30 minutes on 5 or more days each week.  Do not use any products that contain nicotine or tobacco, such as cigarettes, e-cigarettes, and chewing tobacco. If you need help quitting, ask your health care provider.  If you are sexually active, practice safe sex. Use a condom or other form of birth control (contraception) in order to prevent pregnancy and STIs (sexually transmitted infections). If you plan to become pregnant, see your health care provider for a preconception visit. What's next?  Visit your health care provider once a year for a well check visit.  Ask your health care provider how often you should have your eyes and teeth checked.  Stay up to date on all vaccines. This information is not intended to replace advice given to you by your health care provider. Make sure you discuss any questions you have with your health care provider. Document Released: 06/26/2001 Document Revised: 01/09/2018 Document Reviewed: 01/09/2018 Elsevier Patient Education  2020 [employer]vier Inc[employer]  

## 2018-12-02 NOTE — Progress Notes (Signed)
tive:     Katherine Mcfarland is a 33 y.o. female and is here for a comprehensive physical exam. The patient reports no problems.  Social History   Socioeconomic History  . Marital status: Single    Spouse name: Not on file  . Number of children: Not on file  . Years of education: Not on file  . Highest education level: Not on file  Occupational History  . Occupation: CMA    Employer: [employer]  . Financial resource strain: Not on file  . Food insecurity    Worry: Not on file    Inability: Not on file  . Transportation needs    Medical: Not on file    Non-medical: Not on file  Tobacco Use  . Smoking status: Never Smoker  . Smokeless tobacco: Never Used  Substance and Sexual Activity  . Alcohol use: Yes    Comment: social  . Drug use: No  . Sexual activity: Yes    Birth control/protection: None  Lifestyle  . Physical activity    Days per week: Not on file    Minutes per session: Not on file  . Stress: Not on file  Relationships  . Social Herbalist on phone: Not on file    Gets together: Not on file    Attends religious service: Not on file    Active member of club or organization: Not on file    Attends meetings of clubs or organizations: Not on file    Relationship status: Not on file  . Intimate partner violence    Fear of current or ex partner: Not on file    Emotionally abused: Not on file    Physically abused: Not on file    Forced sexual activity: Not on file  Other Topics Concern  . Not on file  Social History Narrative  . Not on file   Health Maintenance  Topic Date Due  . INFLUENZA VACCINE  12/13/2018  . PAP SMEAR-Modifier  10/23/2020  . TETANUS/TDAP  11/28/2025  . HIV Screening  Completed    The following portions of the patient's history were reviewed and updated as appropriate:  She  has a past medical history of Allergy, Headache, Lactose intolerance, Palpitations,  Right knee pain, and Tinnitus. She does not have any pertinent problems on file. She  has a past surgical history that includes Cesarean section and Wisdom tooth extraction. Her family history includes Diabetes in her maternal grandmother; Hyperlipidemia in her mother; Hypertension in her father, maternal grandmother, and mother. She  reports that she has never smoked. She has never used smokeless tobacco. She reports current alcohol use. She reports that she does not use drugs. She has a current medication list which includes the following prescription(s): prenatal/iron, tretinoin, triamcinolone, vitamin d (ergocalciferol), famotidine, levocetirizine, phentermine, and phentermine-topiramate. Current Outpatient Medications on File Prior to Visit  Medication Sig Dispense Refill  . Prenatal Multivit-Min-Fe-FA (PRENATAL/IRON) TABS Take 1 tablet by mouth daily. 90 tablet 3  . tretinoin (RETIN-A) 0.025 % cream Apply topically at bedtime. 45 g 3  . triamcinolone (NASACORT) 55 MCG/ACT AERO nasal inhaler Place 2 sprays into the nose daily. 1 Inhaler 12  . Vitamin D, Ergocalciferol, (DRISDOL) 1.25 MG (50000 UT) CAPS capsule Take 1 capsule (50,000 Units total) by mouth every 7 (seven)  days. 12 capsule 4  . famotidine (PEPCID) 40 MG tablet Take 1 tablet (40 mg total) by mouth daily. 90 tablet 1  . levocetirizine (XYZAL) 5 MG tablet Take 1 tablet (5 mg total) by mouth every evening. 90 tablet 3  . phentermine 37.5 MG capsule Take 1 capsule (37.5 mg total) by mouth every morning. (Patient not taking: Reported on 12/02/2018) 30 capsule 0  . Phentermine-Topiramate (QSYMIA) 3.75-23 MG CP24 1 po qam (Patient not taking: Reported on 09/23/2018) 30 capsule 0   No current facility-administered medications on file prior to visit.    She is allergic to metronidazole and mushroom extract complex..  Review of Systems Review of Systems  Constitutional: Negative for activity change, appetite change and fatigue.  HENT:  Negative for hearing loss, congestion, tinnitus and ear discharge.  dentist q46m Eyes: Negative for visual disturbance (see optho q1y -- vision corrected to 20/20 with glasses).  Respiratory: Negative for cough, chest tightness and shortness of breath.   Cardiovascular: Negative for chest pain, palpitations and leg swelling.  Gastrointestinal: Negative for abdominal pain, diarrhea, constipation and abdominal distention.  Genitourinary: Negative for urgency, frequency, decreased urine volume and difficulty urinating.  Musculoskeletal: Negative for back pain, arthralgias and gait problem.  Skin: Negative for color change, pallor and rash.  Neurological: Negative for dizziness, light-headedness, numbness and headaches.  Hematological: Negative for adenopathy. Does not bruise/bleed easily.  Psychiatric/Behavioral: Negative for suicidal ideas, confusion, sleep disturbance, self-injury, dysphoric mood, decreased concentration and agitation.       Objective:    BP 131/86 (BP Location: Left Arm, Patient Position: Sitting, Cuff Size: Large)   Pulse 79   Temp 98.2 F (36.8 C) (Oral)   Resp 18   Ht 5\' 6"  (1.676 m)   Wt 247 lb (112 kg)   LMP 11/27/2018   SpO2 100%   BMI 39.87 kg/m  General appearance: alert, cooperative, appears stated age and no distress Head: Normocephalic, without obvious abnormality, atraumatic Eyes: conjunctivae/corneas clear. PERRL, EOM's intact. Fundi benign. Ears: normal TM's and external ear canals both ears Nose: Nares normal. Septum midline. Mucosa normal. No drainage or sinus tenderness. Throat: lips, mucosa, and tongue normal; teeth and gums normal Neck: no adenopathy, no carotid bruit, no JVD, supple, symmetrical, trachea midline and thyroid not enlarged, symmetric, no tenderness/mass/nodules Back: symmetric, no curvature. ROM normal. No CVA tenderness. Lungs: clear to auscultation bilaterally Breasts: gyn Heart: regular rate and rhythm, S1, S2 normal, no  murmur, click, rub or gallop Abdomen: soft, non-tender; bowel sounds normal; no masses,  no organomegaly Pelvic: deferred Extremities: extremities normal, atraumatic, no cyanosis or edema Pulses: 2+ and symmetric Skin: Skin color, texture, turgor normal. No rashes or lesions Lymph nodes: Cervical, supraclavicular, and axillary nodes normal. Neurologic: Alert and oriented X 3, normal strength and tone. Normal symmetric reflexes. Normal coordination and gait    Assessment:    Healthy female exam     Plan:  ghm utd Check labs    See After Visit Summary for Counseling Recommendations   Labs reviewed with pt  Subjective:

## 2018-12-10 ENCOUNTER — Other Ambulatory Visit (INDEPENDENT_AMBULATORY_CARE_PROVIDER_SITE_OTHER): Payer: 59

## 2018-12-10 ENCOUNTER — Other Ambulatory Visit: Payer: Self-pay

## 2018-12-10 ENCOUNTER — Telehealth: Payer: Self-pay

## 2018-12-10 DIAGNOSIS — R450 Nervousness: Secondary | ICD-10-CM

## 2018-12-10 DIAGNOSIS — R110 Nausea: Secondary | ICD-10-CM

## 2018-12-10 LAB — HEMOGLOBIN A1C: Hgb A1c MFr Bld: 6.3 % (ref 4.6–6.5)

## 2018-12-10 LAB — HCG, QUANTITATIVE, PREGNANCY: Quantitative HCG: <0.6 m[IU]/mL

## 2018-12-10 NOTE — Telephone Encounter (Signed)
Per Dr. Etter Sjogren patient called in with symptoms of feeling jittery, Dr. Etter Sjogren stated she can have some blood work done.

## 2018-12-11 ENCOUNTER — Other Ambulatory Visit: Payer: Self-pay | Admitting: Family Medicine

## 2018-12-11 DIAGNOSIS — E1165 Type 2 diabetes mellitus with hyperglycemia: Secondary | ICD-10-CM

## 2018-12-11 MED ORDER — METFORMIN HCL ER 500 MG PO TB24: 500.0000 mg | ORAL_TABLET | Freq: Every day | ORAL | 3 refills | Status: DC

## 2018-12-11 MED FILL — METFORMIN HCL ER 500 MG TB2: 500 | 30 days supply | Qty: 30 | Fill #0

## 2018-12-11 MED FILL — FAMOTIDINE 40 MG TABLET: 40 | 30 days supply | Qty: 30 | Fill #1

## 2018-12-25 ENCOUNTER — Encounter: Payer: Self-pay | Admitting: Family Medicine

## 2018-12-25 DIAGNOSIS — U071 COVID-19: Secondary | ICD-10-CM

## 2018-12-29 ENCOUNTER — Other Ambulatory Visit: Payer: Self-pay

## 2018-12-29 DIAGNOSIS — Z20822 Contact with and (suspected) exposure to covid-19: Secondary | ICD-10-CM

## 2018-12-31 LAB — SPECIMEN STATUS REPORT

## 2018-12-31 LAB — NOVEL CORONAVIRUS, NAA: SARS-CoV-2, NAA: NOT DETECTED

## 2019-01-09 MED FILL — FAMOTIDINE 40 MG TABLET: 40 | 30 days supply | Qty: 30 | Fill #2

## 2019-01-10 DIAGNOSIS — Z20828 Contact with and (suspected) exposure to other viral communicable diseases: Secondary | ICD-10-CM | POA: Diagnosis not present

## 2019-01-21 ENCOUNTER — Encounter (HOSPITAL_BASED_OUTPATIENT_CLINIC_OR_DEPARTMENT_OTHER): Payer: Self-pay | Admitting: *Deleted

## 2019-01-21 ENCOUNTER — Other Ambulatory Visit: Payer: Self-pay

## 2019-01-21 ENCOUNTER — Emergency Department (HOSPITAL_BASED_OUTPATIENT_CLINIC_OR_DEPARTMENT_OTHER)
Admission: EM | Admit: 2019-01-21 | Discharge: 2019-01-21 | Disposition: A | Discharge status: Home / Self Care | Payer: 59 | Attending: Emergency Medicine | Admitting: Emergency Medicine

## 2019-01-21 DIAGNOSIS — Z79899 Other long term (current) drug therapy: Secondary | ICD-10-CM | POA: Insufficient documentation

## 2019-01-21 DIAGNOSIS — R002 Palpitations: Secondary | ICD-10-CM | POA: Diagnosis not present

## 2019-01-21 DIAGNOSIS — Z7984 Long term (current) use of oral hypoglycemic drugs: Secondary | ICD-10-CM | POA: Insufficient documentation

## 2019-01-21 NOTE — ED Triage Notes (Signed)
Pt c/o midsternal chest pain with " palpitations"  Hx of anxiety with increased stress

## 2019-01-21 NOTE — ED Provider Notes (Addendum)
[facility] EMERGENCY DEPARTMENT Provider Note   CSN: [id number] Arrival date & time: 01/21/19  2150     History   Chief Complaint Chief Complaint  Patient presents with  . Palpitations    HPI Katherine Mcfarland is a 33 y.o. female.     The history is provided by the patient.  Palpitations Palpitations quality:  Fast Onset quality:  Sudden Timing:  Intermittent Progression:  Resolved Chronicity:  Recurrent Context: anxiety   Context: not caffeine   Relieved by:  Nothing Worsened by:  Nothing Associated symptoms: no back pain, no chest pain, no chest pressure, no cough, no diaphoresis, no dizziness, no hemoptysis, no leg pain, no lower extremity edema, no malaise/fatigue, no nausea, no near-syncope, no numbness, no orthopnea, no PND, no shortness of breath, no syncope, no vomiting and no weakness   Risk factors: no hx of atrial fibrillation and no hx of PE     Past Medical History:  Diagnosis Date  . Allergy    pt. reports seasonal allergies  . Headache   . Lactose intolerance   . Palpitations   . Right knee pain   . Tinnitus     Patient Active Problem List   Diagnosis Date Noted  . Infertility counseling 10/18/2017  . Other hyperlipidemia 01/09/2017  . Vitamin D deficiency 11/07/2016  . Prediabetes 11/07/2016  . Class 2 obesity with serious comorbidity and body mass index (BMI) of 37.0 to 37.9 in adult 11/07/2016  . Anxiety 09/25/2016  . Palpitations 02/07/2016  . Morbid obesity ([place]) 02/07/2016  . Tinnitus of right ear 01/25/2016  . Migraine 10/26/2014    Past Surgical History:  Procedure Laterality Date  . CESAREAN SECTION    . WISDOM TOOTH EXTRACTION       OB History    Gravida  4   Para  4   Term  4   Preterm      AB      Living  4     SAB      TAB      Ectopic      Multiple      Live Births               Home Medications    Prior to Admission medications   Medication Sig Start Date End Date Taking?  Authorizing Provider  famotidine (PEPCID) 40 MG tablet Take 1 tablet (40 mg total) by mouth daily. 11/17/18   Ann Held, DO  levocetirizine (XYZAL) 5 MG tablet Take 1 tablet (5 mg total) by mouth every evening. 11/17/18   Ann Held, DO  metFORMIN (GLUCOPHAGE-XR) 500 MG 24 hr tablet Take 1 tablet (500 mg total) by mouth daily with breakfast. 12/11/18   Carollee Herter, Alferd Apa, DO  Prenatal Multivit-Min-Fe-FA (PRENATAL/IRON) TABS Take 1 tablet by mouth daily. 11/17/18   Ann Held, DO  Vitamin D, Ergocalciferol, (DRISDOL) 1.25 MG (50000 UT) CAPS capsule Take 1 capsule (50,000 Units total) by mouth every 7 (seven) days. 11/19/18   Ann Held, DO    Family History Family History  Problem Relation Age of Onset  . Diabetes Maternal Grandmother   . Hypertension Maternal Grandmother   . Hypertension Mother   . Hyperlipidemia Mother   . Hypertension Father     Social History Social History   Tobacco Use  . Smoking status: Never Smoker  . Smokeless tobacco: Never Used  Substance Use Topics  . Alcohol use:  Yes    Comment: social  . Drug use: No     Allergies   Metronidazole and Mushroom extract complex   Review of Systems Review of Systems  Constitutional: Negative for chills, diaphoresis, fever and malaise/fatigue.  HENT: Negative for ear pain and sore throat.   Eyes: Negative for pain and visual disturbance.  Respiratory: Negative for cough, hemoptysis and shortness of breath.   Cardiovascular: Positive for palpitations. Negative for chest pain, orthopnea, syncope, PND and near-syncope.  Gastrointestinal: Negative for abdominal pain, nausea and vomiting.  Genitourinary: Negative for dysuria and hematuria.  Musculoskeletal: Negative for arthralgias and back pain.  Skin: Negative for color change and rash.  Neurological: Negative for dizziness, seizures, syncope, weakness and numbness.  All other systems reviewed and are negative.    Physical  Exam Updated Vital Signs  ED Triage Vitals  Enc Vitals Group     BP 01/21/19 2155 (!) 143/83     Pulse Rate 01/21/19 2155 91     Resp 01/21/19 2155 18     Temp 01/21/19 2158 98.1 F (36.7 C)     Temp Source 01/21/19 2222 Oral     SpO2 01/21/19 2155 100 %     Weight 01/21/19 2155 240 lb (108.9 kg)     Height 01/21/19 2155 5\' 6"  (1.676 m)     Head Circumference --      Peak Flow --      Pain Score 01/21/19 2222 5     Pain Loc --      Pain Edu? --      Excl. in [place]? --     Physical Exam Vitals signs and nursing note reviewed.  Constitutional:      General: She is not in acute distress.    Appearance: She is well-developed.  HENT:     Head: Normocephalic and atraumatic.     Nose: Nose normal.     Mouth/Throat:     Mouth: Mucous membranes are moist.  Eyes:     Extraocular Movements: Extraocular movements intact.     Conjunctiva/sclera: Conjunctivae normal.     Pupils: Pupils are equal, round, and reactive to light.  Neck:     Musculoskeletal: Normal range of motion and neck supple.  Cardiovascular:     Rate and Rhythm: Normal rate and regular rhythm.     Pulses: Normal pulses.     Heart sounds: Normal heart sounds. No murmur.  Pulmonary:     Effort: Pulmonary effort is normal. No respiratory distress.     Breath sounds: Normal breath sounds.  Abdominal:     Palpations: Abdomen is soft.     Tenderness: There is no abdominal tenderness.  Skin:    General: Skin is warm and dry.     Capillary Refill: Capillary refill takes less than 2 seconds.  Neurological:     General: No focal deficit present.     Mental Status: She is alert.      ED Treatments / Results  Labs (all labs ordered are listed, but only abnormal results are displayed) Labs Reviewed - No data to display  EKG EKG Interpretation  Date/Time:  Wednesday January 21 2019 21:58:02 EDT Ventricular Rate:  78 PR Interval:    QRS Duration: 86 QT Interval:  366 QTC Calculation: 417 R Axis:   7 Text  Interpretation:  Sinus rhythm Confirmed by Lennice Sites [phone]) on 01/21/2019 10:32:13 PM   Radiology No results found.  Procedures Procedures (including critical care time)  Medications  Ordered in ED Medications - No data to display   Initial Impression / Assessment and Plan / ED Course  I have reviewed the triage vital signs and the nursing notes.  Pertinent labs & imaging results that were available during my care of the patient were reviewed by me and considered in my medical decision making (see chart for details).     Katherine Mcfarland is a 33 year old female with history of anxiety, palpitations who presents to the ED with palpitations.  Patient with normal vitals.  No fever.  EKG shows sinus rhythm.  No ischemic changes.  Normal rhythm and rate.  Patient with history of the same.  Has worn a heart monitor in the past that did not reveal any significant findings.  Possibly PVCs versus PACs.  Less likely SVT.  Does not have any chest pain or shortness of breath.  Does not have any chest wall tenderness.  No other concerning symptoms.  Overall given education about palpitations.  Recommend improving sleep and diet.  Recommend cutting out caffeine.  If symptoms continue recommend follow-up with primary care doctor as she may benefit from wearing a heart monitor again.  Discharged in ED good condition.  Given return precautions.  This chart was dictated using voice recognition software.  Despite best efforts to proofread,  errors can occur which can change the documentation meaning.    Final Clinical Impressions(s) / ED Diagnoses   Final diagnoses:  Palpitations    ED Discharge Orders    None       Lennice Sites, DO 01/21/19 [place], [place], DO 01/21/19 2232

## 2019-01-27 ENCOUNTER — Encounter: Payer: Self-pay | Admitting: Family Medicine

## 2019-01-27 ENCOUNTER — Ambulatory Visit (INDEPENDENT_AMBULATORY_CARE_PROVIDER_SITE_OTHER): Payer: 59 | Admitting: Family Medicine

## 2019-01-27 ENCOUNTER — Other Ambulatory Visit: Payer: Self-pay

## 2019-01-27 VITALS — BP 118/90 | HR 92 | Temp 98.3°F | Resp 18 | Ht 66.0 in | Wt 235.0 lb

## 2019-01-27 DIAGNOSIS — I471 Supraventricular tachycardia: Secondary | ICD-10-CM | POA: Diagnosis not present

## 2019-01-27 DIAGNOSIS — E1165 Type 2 diabetes mellitus with hyperglycemia: Secondary | ICD-10-CM

## 2019-01-27 DIAGNOSIS — R002 Palpitations: Secondary | ICD-10-CM

## 2019-01-27 MED ORDER — METFORMIN HCL ER 500 MG PO TB24: 500.0000 mg | ORAL_TABLET | Freq: Every day | ORAL | 3 refills | Status: DC

## 2019-01-27 MED ORDER — FAMOTIDINE 40 MG PO TABS: 40.0000 mg | ORAL_TABLET | Freq: Every day | ORAL | 1 refills | Status: DC

## 2019-01-27 MED ORDER — PRENATAL/IRON PO TABS: 1.0000 | ORAL_TABLET | Freq: Every day | ORAL | 3 refills | Status: DC

## 2019-01-27 MED FILL — metFORMIN HCL ER 500 MG TB2: 500 | 90 days supply | Qty: 90 | Fill #0

## 2019-01-27 NOTE — Assessment & Plan Note (Signed)
Check labs 

## 2019-01-27 NOTE — Patient Instructions (Signed)

## 2019-01-27 NOTE — Assessment & Plan Note (Signed)
They have stopped since er visit Check labs Consider cardio f/u --- consider restart toprol xl at 1/2 dose

## 2019-01-27 NOTE — Assessment & Plan Note (Signed)
D/w diet and exercise  ?

## 2019-01-27 NOTE — Progress Notes (Signed)
Patient ID: Katherine Mcfarland, female    DOB: 1986/04/12  Age: 33 y.o. MRN: [id number]    Subjective:  Subjective  HPI Raelynn JASMYNE SIEMINSKI presents for er f/u for palpitations ---  She since has had none since she left the hospital  No cp, no sob.  Pt states she was suffering from sleeo deprivation and felt better after getting rest.   Review of Systems  Constitutional: Positive for fatigue. Negative for activity change, appetite change and unexpected weight change.  Respiratory: Negative for cough and shortness of breath.   Cardiovascular: Positive for palpitations. Negative for chest pain.  Neurological: Negative for dizziness, light-headedness and headaches.  Psychiatric/Behavioral: Negative for behavioral problems and dysphoric mood. The patient is not nervous/anxious.     History Past Medical History:  Diagnosis Date  . Allergy    pt. reports seasonal allergies  . Headache   . Lactose intolerance   . Palpitations   . Right knee pain   . Tinnitus     She has a past surgical history that includes Cesarean section and Wisdom tooth extraction.   Her family history includes Diabetes in her maternal grandmother; Hyperlipidemia in her mother; Hypertension in her father, maternal grandmother, and mother.She reports that she has never smoked. She has never used smokeless tobacco. She reports current alcohol use. She reports that she does not use drugs.  Current Outpatient Medications on File Prior to Visit  Medication Sig Dispense Refill  . levocetirizine (XYZAL) 5 MG tablet Take 1 tablet (5 mg total) by mouth every evening. 90 tablet 3   No current facility-administered medications on file prior to visit.      Objective:  Objective  Physical Exam Vitals signs and nursing note reviewed.  Constitutional:      Appearance: She is well-developed.  HENT:     Head: Normocephalic and atraumatic.  Eyes:     Conjunctiva/sclera: Conjunctivae normal.  Neck:     Musculoskeletal: Normal  range of motion and neck supple.     Thyroid: No thyromegaly.     Vascular: No carotid bruit or JVD.  Cardiovascular:     Rate and Rhythm: Normal rate and regular rhythm.     Heart sounds: Normal heart sounds. No murmur.  Pulmonary:     Effort: Pulmonary effort is normal. No respiratory distress.     Breath sounds: Normal breath sounds. No wheezing or rales.  Chest:     Chest wall: No tenderness.  Neurological:     Mental Status: She is alert and oriented to person, place, and time.    BP 118/90 (BP Location: Left Arm, Patient Position: Sitting, Cuff Size: Normal)   Pulse 92   Temp 98.3 F (36.8 C) (Oral)   Resp 18   Ht 5\' 6"  (1.676 m)   Wt 235 lb (106.6 kg)   LMP 01/17/2019   SpO2 98%   BMI 37.93 kg/m  Wt Readings from Last 3 Encounters:  01/27/19 235 lb (106.6 kg)  01/21/19 240 lb (108.9 kg)  12/02/18 247 lb (112 kg)     Lab Results  Component Value Date   WBC 5.1 12/02/2018   HGB 12.0 12/02/2018   HCT 37.5 12/02/2018   PLT 368.0 12/02/2018   GLUCOSE 91 11/18/2018   CHOL 175 12/02/2018   TRIG 115.0 12/02/2018   HDL 45.00 12/02/2018   LDLCALC 107 (H) 12/02/2018   ALT 20 11/18/2018   AST 17 11/18/2018   NA 135 11/18/2018   K 4.0 11/18/2018  CL 102 11/18/2018   CREATININE 0.79 11/18/2018   BUN 13 11/18/2018   CO2 26 11/18/2018   TSH 1.39 10/18/2017   HGBA1C 6.3 12/10/2018    No results found.   Assessment & Plan:  Plan  I have discontinued Charnese A. Hellenbrand's Vitamin D (Ergocalciferol). I am also having her maintain her levocetirizine, famotidine, metFORMIN, and Prenatal/Iron.  Meds ordered this encounter  Medications  . famotidine (PEPCID) 40 MG tablet    Sig: Take 1 tablet (40 mg total) by mouth daily.    Dispense:  90 tablet    Refill:  1  . metFORMIN (GLUCOPHAGE-XR) 500 MG 24 hr tablet    Sig: Take 1 tablet (500 mg total) by mouth daily with breakfast.    Dispense:  90 tablet    Refill:  3  . Prenatal Multivit-Min-Fe-FA (PRENATAL/IRON) TABS     Sig: Take 1 tablet by mouth daily.    Dispense:  90 tablet    Refill:  3    Problem List Items Addressed This Visit      Unprioritized   Palpitations - Primary    They have stopped since er visit Check labs Consider cardio f/u --- consider restart toprol xl at 1/2 dose       Relevant Orders   TSH   Vitamin B12   CBC with Differential/Platelet   Comprehensive metabolic panel   PSVT (paroxysmal supraventricular tachycardia) (HCC)    Check labs        Other Visit Diagnoses    Uncontrolled type 2 diabetes mellitus with hyperglycemia (HCC)       Relevant Medications   famotidine (PEPCID) 40 MG tablet   metFORMIN (GLUCOPHAGE-XR) 500 MG 24 hr tablet   Prenatal Multivit-Min-Fe-FA (PRENATAL/IRON) TABS   Other Relevant Orders   Comprehensive metabolic panel      Follow-up: Return if symptoms worsen or fail to improve.  Ann Held, DO

## 2019-01-28 ENCOUNTER — Other Ambulatory Visit (INDEPENDENT_AMBULATORY_CARE_PROVIDER_SITE_OTHER): Payer: 59

## 2019-01-28 DIAGNOSIS — R002 Palpitations: Secondary | ICD-10-CM

## 2019-01-28 DIAGNOSIS — E1165 Type 2 diabetes mellitus with hyperglycemia: Secondary | ICD-10-CM

## 2019-01-28 LAB — CBC WITH DIFFERENTIAL/PLATELET
Basophils Absolute: 0.1 10*3/uL (ref 0.0–0.1)
Basophils Relative: 1.2 % (ref 0.0–3.0)
Eosinophils Absolute: 0.3 10*3/uL (ref 0.0–0.7)
Eosinophils Relative: 5.1 % — ABNORMAL HIGH (ref 0.0–5.0)
HCT: 37.2 % (ref 36.0–46.0)
Hemoglobin: 11.8 g/dL — ABNORMAL LOW (ref 12.0–15.0)
Lymphocytes Relative: 50.1 % — ABNORMAL HIGH (ref 12.0–46.0)
Lymphs Abs: 3.2 10*3/uL (ref 0.7–4.0)
MCHC: 31.7 g/dL (ref 30.0–36.0)
MCV: 83.8 fl (ref 78.0–100.0)
Monocytes Absolute: 0.5 10*3/uL (ref 0.1–1.0)
Monocytes Relative: 7.6 % (ref 3.0–12.0)
Neutro Abs: 2.3 10*3/uL (ref 1.4–7.7)
Neutrophils Relative %: 36 % — ABNORMAL LOW (ref 43.0–77.0)
Platelets: 342 10*3/uL (ref 150.0–400.0)
RBC: 4.44 Mil/uL (ref 3.87–5.11)
RDW: 14.5 % (ref 11.5–15.5)
WBC: 6.3 10*3/uL (ref 4.0–10.5)

## 2019-01-28 LAB — COMPREHENSIVE METABOLIC PANEL
ALT: 21 U/L (ref 0–35)
AST: 17 U/L (ref 0–37)
Albumin: 4 g/dL (ref 3.5–5.2)
Alkaline Phosphatase: 65 U/L (ref 39–117)
BUN: 14 mg/dL (ref 6–23)
CO2: 28 mEq/L (ref 19–32)
Calcium: 9.4 mg/dL (ref 8.4–10.5)
Chloride: 101 mEq/L (ref 96–112)
Creatinine, Ser: 0.8 mg/dL (ref 0.40–1.20)
GFR: 98.45 mL/min (ref >60.00)
Glucose, Bld: 115 mg/dL — ABNORMAL HIGH (ref 70–99)
Potassium: 4.4 mEq/L (ref 3.5–5.1)
Sodium: 136 mEq/L (ref 135–145)
Total Bilirubin: 0.3 mg/dL (ref 0.2–1.2)
Total Protein: 6.8 g/dL (ref 6.0–8.3)

## 2019-01-28 LAB — TSH: TSH: 1.73 u[IU]/mL (ref 0.35–4.50)

## 2019-01-28 LAB — VITAMIN B12: Vitamin B-12: 472 pg/mL (ref 211–911)

## 2019-01-30 MED FILL — PRENATAL VITAMIN PLUS LOW I: 27-1 | 90 days supply | Qty: 90 | Fill #0

## 2019-02-18 MED FILL — FAMOTIDINE 40 MG TABLET: 40 | 30 days supply | Qty: 30 | Fill #3

## 2019-02-25 ENCOUNTER — Other Ambulatory Visit: Payer: Self-pay

## 2019-02-25 ENCOUNTER — Ambulatory Visit (INDEPENDENT_AMBULATORY_CARE_PROVIDER_SITE_OTHER): Payer: 59 | Admitting: *Deleted

## 2019-02-25 DIAGNOSIS — Z111 Encounter for screening for respiratory tuberculosis: Secondary | ICD-10-CM

## 2019-02-25 NOTE — Progress Notes (Signed)
Patient here today for tb test for school.   Tb test placed in right arm and patient tolerated well.  Patient will return in 2-3 days for reading.

## 2019-02-26 ENCOUNTER — Encounter: Payer: Self-pay | Admitting: Medical

## 2019-02-26 ENCOUNTER — Ambulatory Visit (INDEPENDENT_AMBULATORY_CARE_PROVIDER_SITE_OTHER): Payer: 59 | Admitting: Medical

## 2019-02-26 VITALS — BP 122/68 | HR 86 | Temp 98.5°F | Resp 16 | Ht 66.0 in | Wt 240.8 lb

## 2019-02-26 DIAGNOSIS — R42 Dizziness and giddiness: Secondary | ICD-10-CM

## 2019-02-26 DIAGNOSIS — R519 Headache, unspecified: Secondary | ICD-10-CM | POA: Diagnosis not present

## 2019-02-26 DIAGNOSIS — R5383 Other fatigue: Secondary | ICD-10-CM

## 2019-02-26 DIAGNOSIS — R55 Syncope and collapse: Secondary | ICD-10-CM

## 2019-02-26 DIAGNOSIS — K219 Gastro-esophageal reflux disease without esophagitis: Secondary | ICD-10-CM

## 2019-02-26 DIAGNOSIS — R110 Nausea: Secondary | ICD-10-CM

## 2019-02-26 LAB — COMPREHENSIVE METABOLIC PANEL
ALT: 18 U/L (ref 0–35)
AST: 19 U/L (ref 0–37)
Albumin: 4.1 g/dL (ref 3.5–5.2)
Alkaline Phosphatase: 62 U/L (ref 39–117)
BUN: 14 mg/dL (ref 6–23)
CO2: 27 mEq/L (ref 19–32)
Calcium: 9 mg/dL (ref 8.4–10.5)
Chloride: 103 mEq/L (ref 96–112)
Creatinine, Ser: 0.88 mg/dL (ref 0.40–1.20)
GFR: 88.15 mL/min (ref >60.00)
Glucose, Bld: 105 mg/dL — ABNORMAL HIGH (ref 70–99)
Potassium: 3.7 mEq/L (ref 3.5–5.1)
Sodium: 136 mEq/L (ref 135–145)
Total Bilirubin: 0.2 mg/dL (ref 0.2–1.2)
Total Protein: 7.1 g/dL (ref 6.0–8.3)

## 2019-02-26 LAB — CBC WITH DIFFERENTIAL/PLATELET
Basophils Absolute: 0 10*3/uL (ref 0.0–0.1)
Basophils Relative: 0.5 % (ref 0.0–3.0)
Eosinophils Absolute: 0.3 10*3/uL (ref 0.0–0.7)
Eosinophils Relative: 4.7 % (ref 0.0–5.0)
HCT: 33 % — ABNORMAL LOW (ref 36.0–46.0)
Hemoglobin: 10.7 g/dL — ABNORMAL LOW (ref 12.0–15.0)
Lymphocytes Relative: 47.7 % — ABNORMAL HIGH (ref 12.0–46.0)
Lymphs Abs: 3.1 10*3/uL (ref 0.7–4.0)
MCHC: 32.4 g/dL (ref 30.0–36.0)
MCV: 83.8 fl (ref 78.0–100.0)
Monocytes Absolute: 0.4 10*3/uL (ref 0.1–1.0)
Monocytes Relative: 6 % (ref 3.0–12.0)
Neutro Abs: 2.7 10*3/uL (ref 1.4–7.7)
Neutrophils Relative %: 41.1 % — ABNORMAL LOW (ref 43.0–77.0)
Platelets: 362 10*3/uL (ref 150.0–400.0)
RBC: 3.93 Mil/uL (ref 3.87–5.11)
RDW: 14.1 % (ref 11.5–15.5)
WBC: 6.6 10*3/uL (ref 4.0–10.5)

## 2019-02-26 LAB — VITAMIN B12: Vitamin B-12: 452 pg/mL (ref 211–911)

## 2019-02-26 LAB — TSH: TSH: 0.87 u[IU]/mL (ref 0.35–4.50)

## 2019-02-26 LAB — HCG, QUANTITATIVE, PREGNANCY: Quantitative HCG: <0.6 m[IU]/mL

## 2019-02-26 MED ORDER — FLUTICASONE PROPIONATE 50 MCG/ACT NA SUSP: 2.0000 | Freq: Every day | NASAL | 1 refills | Status: DC

## 2019-02-26 MED ORDER — MECLIZINE HCL 12.5 MG PO TABS: 12.5000 mg | ORAL_TABLET | Freq: Three times a day (TID) | ORAL | 0 refills | Status: DC | PRN

## 2019-02-26 MED FILL — MECLIZINE 12.5 MG CAPLET: 12.5 | 33 days supply | Qty: 100 | Fill #0

## 2019-02-26 MED FILL — FLUTICASONE PROP 50 MCG SPR: 50 | 30 days supply | Qty: 16 | Fill #0

## 2019-02-26 NOTE — Patient Instructions (Addendum)
For you intermittent episodes of dizziness, ear pressure,  near syncope and mild fatigue, we did cbc, cmp, tsh, b12, and b1.  Some hx of allergies, so rx flonase and continue xyzal.  If episode of persistent dizziness or vertigo use meclizine.   Ear exam negative but if ear pain let me know and will give antibiotic.  ekg normal in September. Watch for any cardiac synptoms. If so repeat ekg. Maybe holter?  Anemia and hx of low iron. Continue multivitamin.   Will discuss with your pcp to see if reoccurs if will get imaging studies such as ct. Other differ dx considered such as increased intracranial pressure in diff dx. Maybe referral to specialist.  Follow up date to be determined.

## 2019-02-26 NOTE — Progress Notes (Signed)
Subjective:    Patient ID: Katherine Mcfarland, female    DOB: [birth year], 33 y.o.   MRN: [id number]  HPI  Sudnay and monday states had bad ha(took tylenol and ibuprofen and ha resolved). At that time no gross motor or sensory function deficits.  Called out of work [employer]. Yesterday had ha as well with episode of dizziness. 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 She bent over and felt pressure in both ears. She states like tried to speak but couldn't. Pt felt like almost was about to pass out. Quickly able to speak and no gross motor or sensory function deficits.  Pt states feels ok now.  She only has slight light headed sensation now.    LMP- oct 1st. Normal cycle and came when it should have.  Normal ekg in past sept    Review of Systems  Constitutional: Negative for chills, fatigue and fever.  HENT: Positive for congestion and rhinorrhea. Negative for ear pain, postnasal drip, sinus pressure, sneezing, tinnitus and trouble swallowing.        Brief ear pressure today. Some allergy history.   Runny nose today.  Eyes: Negative for pain and visual disturbance.       No pressure behind eye.  Respiratory: Negative for cough, shortness of breath and wheezing.   Cardiovascular: Negative for chest pain and palpitations.  Gastrointestinal: Negative for abdominal pain, nausea and vomiting.  Musculoskeletal: Negative for back pain.  Neurological: Positive for dizziness. Negative for tremors, seizures and weakness.       Pt did have head pressure.    She does get ha around her cycles. Day her cycle finished.   Brief presyncope sensation for seconds earlier today. Resolved now for 3 hours or so  Hematological: Negative for adenopathy. Does not bruise/bleed easily.  Psychiatric/Behavioral: Negative for behavioral problems.    Past Medical History:  Diagnosis Date  . Allergy    pt. reports seasonal allergies  . Headache   . Lactose intolerance   . Palpitations   . Right knee pain   .  Tinnitus      Social History   Socioeconomic History  . Marital status: Single    Spouse name: Not on file  . Number of children: Not on file  . Years of education: Not on file  . Highest education level: Not on file  Occupational History  . Occupation: CMA    Employer: [employer]  . Financial resource strain: Not on file  . Food insecurity    Worry: Not on file    Inability: Not on file  . Transportation needs    Medical: Not on file    Non-medical: Not on file  Tobacco Use  . Smoking status: Never Smoker  . Smokeless tobacco: Never Used  Substance and Sexual Activity  . Alcohol use: Yes    Comment: social  . Drug use: No  . Sexual activity: Yes    Birth control/protection: None  Lifestyle  . Physical activity    Days per week: Not on file    Minutes per session: Not on file  . Stress: Not on file  Relationships  . Social Herbalist on phone: Not on file    Gets together: Not on file    Attends religious service: Not on file    Active member of club or organization: Not on file    Attends meetings of clubs or organizations: Not on file    Relationship status:  Not on file  . Intimate partner violence    Fear of current or ex partner: Not on file    Emotionally abused: Not on file    Physically abused: Not on file    Forced sexual activity: Not on file  Other Topics Concern  . Not on file  Social History Narrative  . Not on file    Past Surgical History:  Procedure Laterality Date  . CESAREAN SECTION    . WISDOM TOOTH EXTRACTION      Family History  Problem Relation Age of Onset  . Diabetes Maternal Grandmother   . Hypertension Maternal Grandmother   . Hypertension Mother   . Hyperlipidemia Mother   . Hypertension Father     Allergies  Allergen Reactions  . Metronidazole Nausea And Vomiting  . Mushroom Extract Complex Diarrhea    Current Outpatient Medications on File Prior to Visit  Medication Sig Dispense Refill   . famotidine (PEPCID) 40 MG tablet Take 1 tablet (40 mg total) by mouth daily. 90 tablet 1  . levocetirizine (XYZAL) 5 MG tablet Take 1 tablet (5 mg total) by mouth every evening. 90 tablet 3  . metFORMIN (GLUCOPHAGE-XR) 500 MG 24 hr tablet Take 1 tablet (500 mg total) by mouth daily with breakfast. 90 tablet 3  . Prenatal Multivit-Min-Fe-FA (PRENATAL/IRON) TABS Take 1 tablet by mouth daily. 90 tablet 3   No current facility-administered medications on file prior to visit.     BP 122/68   Pulse 86   Temp 98.5 F (36.9 C) (Oral)   Resp 16   Ht 5\' 6"  (1.676 m)   Wt 240 lb 12.8 oz (109.2 kg)   SpO2 100%   BMI 38.87 kg/m       Objective:   Physical Exam  General  Mental Status - Alert. General Appearance - Well groomed. Not in acute distress.  Skin Rashes- No Rashes.  HEENT Head- Normal. Ear Auditory Canal - Left- Normal. Right - Normal.Tympanic Membrane- Left- Normal. Right- Normal. Eye Sclera/Conjunctiva- Left- Normal. Right- Normal. Nose & Sinuses Nasal Mucosa- Left-  Boggy and Congested. Right-  Boggy and  Congested.Bilateral maxillary and frontal sinus pressure. Mouth & Throat Lips: Upper Lip- Normal: no dryness, cracking, pallor, cyanosis, or vesicular eruption. Lower Lip-Normal: no dryness, cracking, pallor, cyanosis or vesicular eruption. Buccal Mucosa- Bilateral- No Aphthous ulcers. Oropharynx- No Discharge or Erythema. Tonsils: Characteristics- Bilateral- No Erythema or Congestion. Size/Enlargement- Bilateral- No enlargement. Discharge- bilateral-None.  Neck Neck- Supple. No Masses.   Chest and Lung Exam Auscultation: Breath Sounds:-Clear even and unlabored.  Cardiovascular Auscultation:Rythm- Regular, rate and rhythm. Murmurs & Other Heart Sounds:Ausculatation of the heart reveal- No Murmurs.  Lymphatic Head & Neck General Head & Neck Lymphatics: Bilateral: Description- No Localized lymphadenopathy.       Assessment & Plan:  For you intermittent  episodes of dizziness, ear pressure,  near syncope and mild fatigue, we did cbc, cmp, tsh, b12, and b1.  Some hx of allergies, so rx flonase and continue xyzal.  If episode of persistent dizziness or vertigo use meclizine.   Ear exam negative but if ear pain let me know and will give antibiotic.  ekg normal in September. Watch for any cardiac synptoms. If so repeat ekg. Maybe holter?  Anemia and hx of low iron. Continue multivitamin.   Will discuss with your pcp to see if reoccurs if will get imaging studies such as ct. Other differ dx considered. Increased intracranial pressure in diff dx. Maybe referral to specialist.  Follow up date to be determined.  Mackie Pai, PA-C

## 2019-02-27 ENCOUNTER — Telehealth: Payer: Self-pay | Admitting: Medical

## 2019-02-27 ENCOUNTER — Other Ambulatory Visit (INDEPENDENT_AMBULATORY_CARE_PROVIDER_SITE_OTHER): Payer: 59

## 2019-02-27 DIAGNOSIS — D649 Anemia, unspecified: Secondary | ICD-10-CM

## 2019-02-27 LAB — IBC PANEL
Iron: 56 ug/dL (ref 42–145)
Saturation Ratios: 13.8 % — ABNORMAL LOW (ref 20.0–50.0)
Transferrin: 289 mg/dL (ref 212.0–360.0)

## 2019-02-27 NOTE — Telephone Encounter (Signed)
Dr. Etter Sjogren and Charlett Blake  I saw Katherine Mcfarland recently. Hard to pin point exact cause of symptoms. Sent you note to review. Wanted second opinion.  Thanks.  Percell Miller

## 2019-02-27 NOTE — Telephone Encounter (Signed)
Yes tough one. Lots of nonspecific symptoms and possibly many causes not the least of which is fatigue and stress but cannot rule out more concerning medical concerns. If persists trial of antibiotics followed by imaging and referral to ENT and/or neurology might be warranted.

## 2019-02-28 NOTE — Telephone Encounter (Signed)
Appreciate your help

## 2019-03-01 ENCOUNTER — Encounter: Payer: Self-pay | Admitting: Family Medicine

## 2019-03-02 ENCOUNTER — Other Ambulatory Visit: Payer: Self-pay | Admitting: Family Medicine

## 2019-03-02 ENCOUNTER — Other Ambulatory Visit: Payer: Self-pay

## 2019-03-02 ENCOUNTER — Other Ambulatory Visit (INDEPENDENT_AMBULATORY_CARE_PROVIDER_SITE_OTHER): Payer: 59

## 2019-03-02 DIAGNOSIS — M79676 Pain in unspecified toe(s): Secondary | ICD-10-CM | POA: Diagnosis not present

## 2019-03-02 LAB — VITAMIN B1: Vitamin B1 (Thiamine): 18 nmol/L (ref 8–30)

## 2019-03-02 NOTE — Telephone Encounter (Signed)
Order is in.

## 2019-03-02 NOTE — Telephone Encounter (Signed)
Agree with stacey Sorry I was out of commission this weekend

## 2019-03-02 NOTE — Addendum Note (Signed)
Addended by: Kelle Darting A on: 03/02/2019 04:29 PM   Modules accepted: Orders

## 2019-03-03 LAB — COMPREHENSIVE METABOLIC PANEL
ALT: 18 U/L (ref 0–35)
AST: 17 U/L (ref 0–37)
Albumin: 4.4 g/dL (ref 3.5–5.2)
Alkaline Phosphatase: 67 U/L (ref 39–117)
BUN: 12 mg/dL (ref 6–23)
CO2: 29 mEq/L (ref 19–32)
Calcium: 9.6 mg/dL (ref 8.4–10.5)
Chloride: 101 mEq/L (ref 96–112)
Creatinine, Ser: 0.85 mg/dL (ref 0.40–1.20)
GFR: 91.75 mL/min (ref >60.00)
Glucose, Bld: 97 mg/dL (ref 70–99)
Potassium: 3.7 mEq/L (ref 3.5–5.1)
Sodium: 137 mEq/L (ref 135–145)
Total Bilirubin: 0.2 mg/dL (ref 0.2–1.2)
Total Protein: 7.2 g/dL (ref 6.0–8.3)

## 2019-03-03 LAB — URIC ACID: Uric Acid, Serum: 3.5 mg/dL (ref 2.4–7.0)

## 2019-03-19 ENCOUNTER — Encounter: Payer: Self-pay | Admitting: Family Medicine

## 2019-03-20 ENCOUNTER — Other Ambulatory Visit: Payer: Self-pay | Admitting: Family Medicine

## 2019-03-20 DIAGNOSIS — M545 Low back pain, unspecified: Secondary | ICD-10-CM

## 2019-03-20 DIAGNOSIS — E1165 Type 2 diabetes mellitus with hyperglycemia: Secondary | ICD-10-CM

## 2019-03-20 DIAGNOSIS — N761 Subacute and chronic vaginitis: Secondary | ICD-10-CM

## 2019-03-20 MED ORDER — FLUCONAZOLE 150 MG PO TABS: ORAL_TABLET | ORAL | 0 refills | Status: DC

## 2019-03-20 MED ORDER — METHOCARBAMOL 500 MG PO TABS: 500.0000 mg | ORAL_TABLET | Freq: Four times a day (QID) | ORAL | 1 refills | Status: DC

## 2019-03-20 MED ORDER — PREDNISONE 10 MG PO TABS: ORAL_TABLET | ORAL | 0 refills | Status: DC

## 2019-03-20 MED ORDER — FAMOTIDINE 40 MG PO TABS: 40.0000 mg | ORAL_TABLET | Freq: Every day | ORAL | 1 refills | Status: DC

## 2019-03-20 MED FILL — FLUCONAZOLE 150 MG TABS: 150 | 4 days supply | Qty: 2 | Fill #0

## 2019-03-20 MED FILL — METHOCARBAMOL 500 MG TABLET: 500 | 12 days supply | Qty: 45 | Fill #0

## 2019-03-20 MED FILL — predniSONE 10 MG TABS: 10 | 12 days supply | Qty: 20 | Fill #0

## 2019-03-20 MED FILL — FAMOTIDINE 40 MG TABLET: 40 | 90 days supply | Qty: 90 | Fill #0

## 2019-03-27 ENCOUNTER — Other Ambulatory Visit: Payer: Self-pay

## 2019-03-27 DIAGNOSIS — J302 Other seasonal allergic rhinitis: Secondary | ICD-10-CM

## 2019-03-27 DIAGNOSIS — N761 Subacute and chronic vaginitis: Secondary | ICD-10-CM

## 2019-03-27 DIAGNOSIS — L700 Acne vulgaris: Secondary | ICD-10-CM

## 2019-03-27 DIAGNOSIS — E559 Vitamin D deficiency, unspecified: Secondary | ICD-10-CM

## 2019-03-27 MED ORDER — VITAMIN D (ERGOCALCIFEROL) 1.25 MG (50000 UNIT) PO CAPS: 50000.0000 [IU] | ORAL_CAPSULE | ORAL | 0 refills | Status: DC

## 2019-03-27 MED ORDER — FLUCONAZOLE 150 MG PO TABS: ORAL_TABLET | ORAL | 0 refills | Status: DC

## 2019-03-27 MED ORDER — TRETINOIN 0.025 % EX CREA: TOPICAL_CREAM | Freq: Every day | CUTANEOUS | 3 refills | Status: DC

## 2019-03-27 MED FILL — VIT D2 1.25 MG (50,000 UNIT: 1.25 MG | 84 days supply | Qty: 12 | Fill #0

## 2019-03-27 MED FILL — FLUCONAZOLE 150 MG TABS: 150 | 14 days supply | Qty: 14 | Fill #0

## 2019-03-31 MED ORDER — LEVOCETIRIZINE DIHYDROCHLORIDE 5 MG PO TABS: 5.0000 mg | ORAL_TABLET | Freq: Two times a day (BID) | ORAL | 3 refills | Status: DC

## 2019-03-31 MED FILL — LEVOCETIRIZINE 5 MG TABLET: 5 | 90 days supply | Qty: 180 | Fill #0

## 2019-03-31 NOTE — Addendum Note (Signed)
Addended by: Sanda Linger on: 03/31/2019 11:36 AM   Modules accepted: Orders

## 2019-04-02 ENCOUNTER — Telehealth: Payer: Self-pay

## 2019-04-02 NOTE — Telephone Encounter (Signed)
PA initiated via Covermymeds; KEY: [id number]. Awaiting determination.

## 2019-04-03 ENCOUNTER — Other Ambulatory Visit: Payer: Self-pay

## 2019-04-03 ENCOUNTER — Ambulatory Visit (INDEPENDENT_AMBULATORY_CARE_PROVIDER_SITE_OTHER): Payer: 59 | Admitting: Family Medicine

## 2019-04-03 ENCOUNTER — Encounter: Payer: Self-pay | Admitting: Family Medicine

## 2019-04-03 VITALS — BP 110/72 | HR 78 | Temp 98.5°F | Ht 66.0 in | Wt 230.0 lb

## 2019-04-03 DIAGNOSIS — J0140 Acute pansinusitis, unspecified: Secondary | ICD-10-CM | POA: Diagnosis not present

## 2019-04-03 DIAGNOSIS — J302 Other seasonal allergic rhinitis: Secondary | ICD-10-CM | POA: Insufficient documentation

## 2019-04-03 MED ORDER — PREDNISONE 20 MG PO TABS: 40.0000 mg | ORAL_TABLET | Freq: Every day | ORAL | 0 refills | Status: AC

## 2019-04-03 MED ORDER — MONTELUKAST SODIUM 10 MG PO TABS: 10.0000 mg | ORAL_TABLET | Freq: Every day | ORAL | 3 refills | Status: DC

## 2019-04-03 MED FILL — predniSONE 20 MG TABS: 20 | 5 days supply | Qty: 10 | Fill #0

## 2019-04-03 MED FILL — MONTELUKAST SOD 10 MG TAB: 10 | 30 days supply | Qty: 30 | Fill #0

## 2019-04-03 NOTE — Progress Notes (Signed)
Chief Complaint  Patient presents with  . Sinusitis    Katherine Mcfarland here for URI complaints.  Duration: 6 days  Associated symptoms: sinus headache, sinus congestion, sinus pain, rhinorrhea, itchy watery eyes and some cough Denies: ear pain, ear drainage, sore throat, wheezing, shortness of breath, myalgia and fevers, gi ss's Treatment to date: INCS, Xyzal, Tylenol, ibuprofen Sick contacts: No  She has a hx of allergies, this happens around same time twice per year.   ROS:  Const: Denies fevers HEENT: As noted in HPI Lungs: No SOB  Past Medical History:  Diagnosis Date  . Allergy    pt. reports seasonal allergies  . Headache   . Lactose intolerance   . Palpitations   . Right knee pain   . Tinnitus    BP 110/72 (BP Location: Left Arm, Patient Position: Sitting, Cuff Size: Large)   Pulse 78   Temp 98.5 F (36.9 C) (Temporal)   Ht 5\' 6"  (1.676 m)   Wt 230 lb (104.3 kg)   SpO2 98%   BMI 37.12 kg/m  General: Awake, alert, appears stated age HEENT: AT, [place], ears patent b/l and TM's neg, nares patent w/o discharge, turbinates are edematous, pharynx pink, +ttp over max and frontal sinus on R, and without exudates, MMM Neck: No masses or asymmetry Heart: RRR Lungs: CTAB, no accessory muscle use Psych: Age appropriate judgment and insight, normal mood and affect  Acute pansinusitis, recurrence not specified - Plan: predniSONE (DELTASONE) 20 MG tablet  Seasonal allergies - Plan: montelukast (SINGULAIR) 10 MG tablet  Pred burst. Let me know if no better, will rx abx in that case, would need Diflucan as well.  Montelukast in future during these time periods to hopefully prevent.  Continue to push fluids, practice good hand hygiene, cover mouth when coughing. F/u prn. If starting to experience fevers, shaking, or shortness of breath, seek immediate care. Pt voiced understanding and agreement to the plan.  [place], DO 04/03/19 11:17 AM

## 2019-04-03 NOTE — Patient Instructions (Signed)
If no better in next few days, let me know and we will send in an antibiotic.  Continue your Flonase and Xyzal.  Around the fall and spring, add montelukast.   Let us know if you need anything.

## 2019-04-06 NOTE — Telephone Encounter (Signed)
PA approved. Effective 03/30/2019 to 03/28/2020.

## 2019-04-17 ENCOUNTER — Encounter: Payer: Self-pay | Admitting: Family Medicine

## 2019-04-20 ENCOUNTER — Other Ambulatory Visit: Payer: Self-pay | Admitting: Family Medicine

## 2019-05-04 ENCOUNTER — Other Ambulatory Visit: Payer: Self-pay

## 2019-05-04 ENCOUNTER — Ambulatory Visit (INDEPENDENT_AMBULATORY_CARE_PROVIDER_SITE_OTHER): Payer: 59 | Admitting: Family Medicine

## 2019-05-04 ENCOUNTER — Encounter: Payer: Self-pay | Admitting: Family Medicine

## 2019-05-04 VITALS — BP 110/80 | HR 88 | Temp 98.6°F | Resp 18 | Ht 66.0 in | Wt 235.0 lb

## 2019-05-04 DIAGNOSIS — Z1239 Encounter for other screening for malignant neoplasm of breast: Secondary | ICD-10-CM

## 2019-05-04 DIAGNOSIS — N6310 Unspecified lump in the right breast, unspecified quadrant: Secondary | ICD-10-CM | POA: Diagnosis not present

## 2019-05-04 LAB — POCT URINE PREGNANCY: Preg Test, Ur: NEGATIVE

## 2019-05-04 NOTE — Progress Notes (Signed)
Patient ID: Katherine Mcfarland, female    DOB: 03-Feb-1986  Age: 33 y.o. MRN: [id number]    Subjective:  Subjective  HPI Katherine Mcfarland presents for mass R breast that is tender and she just recently noticed it     She has felt one on the L breast as well but it is not tender  Review of Systems  Constitutional: Negative for appetite change, diaphoresis, fatigue and unexpected weight change.  Eyes: Negative for pain, redness and visual disturbance.  Respiratory: Negative for cough, chest tightness, shortness of breath and wheezing.   Cardiovascular: Negative for chest pain, palpitations and leg swelling.  Endocrine: Negative for cold intolerance, heat intolerance, polydipsia, polyphagia and polyuria.  Genitourinary: Negative for difficulty urinating, dysuria and frequency.  Neurological: Negative for dizziness, light-headedness, numbness and headaches.    History Past Medical History:  Diagnosis Date  . Allergy    pt. reports seasonal allergies  . Headache   . Lactose intolerance   . Palpitations   . Right knee pain   . Tinnitus     She has a past surgical history that includes Cesarean section and Wisdom tooth extraction.   Her family history includes Diabetes in her maternal grandmother; Hyperlipidemia in her mother; Hypertension in her father, maternal grandmother, and mother.She reports that she has never smoked. She has never used smokeless tobacco. She reports current alcohol use. She reports that she does not use drugs.  Current Outpatient Medications on File Prior to Visit  Medication Sig Dispense Refill  . famotidine (PEPCID) 40 MG tablet Take 1 tablet (40 mg total) by mouth daily. 90 tablet 1  . fluconazole (DIFLUCAN) 150 MG tablet Take 1 tablet by mouth for 14 days then stop 14 tablet 0  . fluticasone (FLONASE) 50 MCG/ACT nasal spray Place 2 sprays into both nostrils daily. 16 g 1  . levocetirizine (XYZAL) 5 MG tablet Take 1 tablet (5 mg total) by mouth 2 (two) times  daily. 180 tablet 3  . meclizine (ANTIVERT) 12.5 MG tablet Take 1 tablet (12.5 mg total) by mouth 3 (three) times daily as needed for dizziness. 30 tablet 0  . metFORMIN (GLUCOPHAGE-XR) 500 MG 24 hr tablet Take 1 tablet (500 mg total) by mouth daily with breakfast. 90 tablet 3  . methocarbamol (ROBAXIN) 500 MG tablet Take 1 tablet (500 mg total) by mouth 4 (four) times daily. 45 tablet 1  . montelukast (SINGULAIR) 10 MG tablet Take 1 tablet (10 mg total) by mouth at bedtime. 30 tablet 3  . Prenatal Multivit-Min-Fe-FA (PRENATAL/IRON) TABS Take 1 tablet by mouth daily. 90 tablet 3  . tretinoin (RETIN-A) 0.025 % cream Apply topically at bedtime. 45 g 3  . Vitamin D, Ergocalciferol, (DRISDOL) 1.25 MG (50000 UT) CAPS capsule Take 1 capsule (50,000 Units total) by mouth every 7 (seven) days. 12 capsule 0   No current facility-administered medications on file prior to visit.     Objective:  Objective  Physical Exam Vitals and nursing note reviewed.  Chest:     Chest wall: Mass and tenderness present.      BP 110/80 (BP Location: Right Arm, Patient Position: Sitting, Cuff Size: Large)   Pulse 88   Temp 98.6 F (37 C) (Temporal)   Resp 18   Ht 5\' 6"  (1.676 m)   Wt 235 lb (106.6 kg)   SpO2 99%   BMI 37.93 kg/m  Wt Readings from Last 3 Encounters:  05/04/19 235 lb (106.6 kg)  04/03/19 230 lb (104.3  kg)  02/26/19 240 lb 12.8 oz (109.2 kg)     Lab Results  Component Value Date   WBC 6.6 02/26/2019   HGB 10.7 (L) 02/26/2019   HCT 33.0 (L) 02/26/2019   PLT 362.0 02/26/2019   GLUCOSE 97 03/02/2019   CHOL 175 12/02/2018   TRIG 115.0 12/02/2018   HDL 45.00 12/02/2018   LDLCALC 107 (H) 12/02/2018   ALT 18 03/02/2019   AST 17 03/02/2019   NA 137 03/02/2019   K 3.7 03/02/2019   CL 101 03/02/2019   CREATININE 0.85 03/02/2019   BUN 12 03/02/2019   CO2 29 03/02/2019   TSH 0.87 02/26/2019   HGBA1C 6.3 12/10/2018    No results found.   Assessment & Plan:  Plan  I am having  Katherine Mcfarland maintain her metFORMIN, Prenatal/Iron, fluticasone, meclizine, methocarbamol, famotidine, fluconazole, tretinoin, Vitamin D (Ergocalciferol), levocetirizine, and montelukast.  No orders of the defined types were placed in this encounter.   Problem List Items Addressed This Visit    None    Visit Diagnoses    Mass of right breast    -  Primary   Relevant Orders   POCT urine pregnancy   MM DIAG BREAST TOMO BILATERAL   US BREAST LTD UNI RIGHT INC AXILLA   Screening breast examination       Relevant Orders   hCG, serum, qualitative      Follow-up: Return if symptoms worsen or fail to improve.  Ann Held, DO

## 2019-05-13 ENCOUNTER — Other Ambulatory Visit: Payer: 59

## 2019-05-13 ENCOUNTER — Other Ambulatory Visit: Payer: Self-pay

## 2019-05-13 DIAGNOSIS — Z1239 Encounter for other screening for malignant neoplasm of breast: Secondary | ICD-10-CM | POA: Diagnosis not present

## 2019-05-14 LAB — HCG, SERUM, QUALITATIVE: Preg, Serum: NEGATIVE

## 2019-05-20 ENCOUNTER — Other Ambulatory Visit: Payer: Self-pay

## 2019-05-20 ENCOUNTER — Ambulatory Visit
Admission: RE | Admit: 2019-05-20 | Discharge: 2019-05-20 | Disposition: A | Discharge status: Home / Self Care | Payer: 59 | Source: Ambulatory Visit | Attending: Family Medicine | Admitting: Family Medicine

## 2019-05-20 ENCOUNTER — Ambulatory Visit
Admission: RE | Admit: 2019-05-20 | Discharge: 2019-05-20 | Disposition: A | Discharge status: Home / Self Care | Payer: PRIVATE HEALTH INSURANCE | Source: Ambulatory Visit | Attending: Family Medicine | Admitting: Family Medicine

## 2019-05-20 ENCOUNTER — Other Ambulatory Visit: Payer: Self-pay | Admitting: Family Medicine

## 2019-05-20 DIAGNOSIS — N6320 Unspecified lump in the left breast, unspecified quadrant: Secondary | ICD-10-CM

## 2019-05-20 DIAGNOSIS — N6310 Unspecified lump in the right breast, unspecified quadrant: Secondary | ICD-10-CM

## 2019-06-18 ENCOUNTER — Encounter: Payer: Self-pay | Admitting: Family Medicine

## 2019-06-18 ENCOUNTER — Other Ambulatory Visit: Payer: Self-pay | Admitting: Family Medicine

## 2019-06-18 MED ORDER — AMOXICILLIN 500 MG PO CAPS: 500.0000 mg | ORAL_CAPSULE | Freq: Three times a day (TID) | ORAL | 0 refills | Status: DC

## 2019-06-18 MED FILL — AMOXICILLIN 500 MG CAPSULE: 500 | 7 days supply | Qty: 21 | Fill #0

## 2019-06-23 ENCOUNTER — Ambulatory Visit (HOSPITAL_COMMUNITY)
Admission: RE | Admit: 2019-06-23 | Discharge: 2019-06-23 | Disposition: A | Discharge status: Home / Self Care | Payer: Self-pay | Attending: Psychiatry | Admitting: Psychiatry

## 2019-06-23 DIAGNOSIS — R45851 Suicidal ideations: Secondary | ICD-10-CM | POA: Insufficient documentation

## 2019-06-23 DIAGNOSIS — Z91411 Personal history of adult psychological abuse: Secondary | ICD-10-CM | POA: Insufficient documentation

## 2019-06-23 DIAGNOSIS — F332 Major depressive disorder, recurrent severe without psychotic features: Secondary | ICD-10-CM | POA: Insufficient documentation

## 2019-06-23 DIAGNOSIS — Z91410 Personal history of adult physical and sexual abuse: Secondary | ICD-10-CM | POA: Insufficient documentation
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
## 2019-06-24 ENCOUNTER — Encounter: Payer: Self-pay | Admitting: Family Medicine

## 2019-06-24 NOTE — Telephone Encounter (Signed)
I spoke to her this am I'm writing her out until at least Monday--- she will see me Monday am and we will discuss it further I suspect she will be out longer --- she is reluctant to be out but honestly I think she needs it She does not want this to get out so if we can keep it as quiet as possible until she decides to talk to people    thanks

## 2019-06-25 MED FILL — LEVOCETIRIZINE 5 MG TABLET: 5 | 90 days supply | Qty: 180 | Fill #1

## 2019-06-25 MED FILL — FAMOTIDINE 40 MG TABLET: 40 | 90 days supply | Qty: 90 | Fill #1

## 2019-06-29 ENCOUNTER — Ambulatory Visit (INDEPENDENT_AMBULATORY_CARE_PROVIDER_SITE_OTHER): Payer: Self-pay | Admitting: Family Medicine

## 2019-06-29 ENCOUNTER — Encounter: Payer: Self-pay | Admitting: Family Medicine

## 2019-06-29 ENCOUNTER — Other Ambulatory Visit: Payer: Self-pay

## 2019-06-29 VITALS — BP 112/80 | HR 111 | Temp 97.5°F | Ht 66.0 in | Wt 230.0 lb

## 2019-06-29 DIAGNOSIS — F418 Other specified anxiety disorders: Secondary | ICD-10-CM

## 2019-06-29 MED ORDER — FLUOXETINE HCL 20 MG PO TABS: 20.0000 mg | ORAL_TABLET | Freq: Every day | ORAL | 3 refills | Status: DC

## 2019-06-29 MED ORDER — ALPRAZOLAM 0.25 MG PO TABS: 0.2500 mg | ORAL_TABLET | Freq: Two times a day (BID) | ORAL | 0 refills | Status: DC | PRN

## 2019-06-29 MED FILL — FLUOXETINE HCL 20 MG TABS: 20 | 30 days supply | Qty: 30 | Fill #0

## 2019-06-29 MED FILL — ALPRAZolam 0.25 MG TABS: 0.25 | 10 days supply | Qty: 20 | Fill #0

## 2019-06-29 NOTE — Progress Notes (Signed)
Patient ID: Katherine Mcfarland, female    DOB: Oct 13, 1985  Age: 34 y.o. MRN: [id number]    Subjective:  Subjective  HPI Alonzo MAIDA NICKLAUS presents for f/u depression / anxiety.  Pt went to behavior health 2/9/and it was decided she did not need admission.  She is not suicidal    Has had a lot of stress at home.   She is a single mother living with her boyfriend whom she just broke up with    Her relationship with her mom has also been strained    She is in bed a lot and having palpitations       beh health eval reviewed  She had a counseling app that will be rescheduled today Review of Systems  Constitutional: Negative for appetite change, diaphoresis, fatigue and unexpected weight change.  Eyes: Negative for pain, redness and visual disturbance.  Respiratory: Negative for cough, chest tightness, shortness of breath and wheezing.   Cardiovascular: Negative for chest pain, palpitations and leg swelling.  Endocrine: Negative for cold intolerance, heat intolerance, polydipsia, polyphagia and polyuria.  Genitourinary: Negative for difficulty urinating, dysuria and frequency.  Neurological: Negative for dizziness, light-headedness, numbness and headaches.  Psychiatric/Behavioral: Positive for decreased concentration and dysphoric mood. Negative for confusion, hallucinations, self-injury, sleep disturbance and suicidal ideas. The patient is nervous/anxious. The patient is not hyperactive.     History Past Medical History:  Diagnosis Date  . Allergy    pt. reports seasonal allergies  . Headache   . Lactose intolerance   . Palpitations   . Right knee pain   . Tinnitus     She has a past surgical history that includes Cesarean section and Wisdom tooth extraction.   Her family history includes Diabetes in her maternal grandmother; Hyperlipidemia in her mother; Hypertension in her father, maternal grandmother, and mother.She reports that she has never smoked. She has never used smokeless  tobacco. She reports current alcohol use. She reports that she does not use drugs.  Current Outpatient Medications on File Prior to Visit  Medication Sig Dispense Refill  . famotidine (PEPCID) 40 MG tablet Take 1 tablet (40 mg total) by mouth daily. 90 tablet 1  . fluticasone (FLONASE) 50 MCG/ACT nasal spray Place 2 sprays into both nostrils daily. 16 g 1  . levocetirizine (XYZAL) 5 MG tablet Take 1 tablet (5 mg total) by mouth 2 (two) times daily. 180 tablet 3  . montelukast (SINGULAIR) 10 MG tablet Take 1 tablet (10 mg total) by mouth at bedtime. 30 tablet 3  . Prenatal Multivit-Min-Fe-FA (PRENATAL/IRON) TABS Take 1 tablet by mouth daily. 90 tablet 3  . tretinoin (RETIN-A) 0.025 % cream Apply topically at bedtime. 45 g 3  . Vitamin D, Ergocalciferol, (DRISDOL) 1.25 MG (50000 UT) CAPS capsule Take 1 capsule (50,000 Units total) by mouth every 7 (seven) days. 12 capsule 0  . fluconazole (DIFLUCAN) 150 MG tablet Take 1 tablet by mouth for 14 days then stop (Patient not taking: Reported on 06/29/2019) 14 tablet 0   No current facility-administered medications on file prior to visit.     Objective:  Objective  Physical Exam Vitals and nursing note reviewed.  Constitutional:      Appearance: She is well-developed.  HENT:     Head: Normocephalic and atraumatic.  Eyes:     Conjunctiva/sclera: Conjunctivae normal.  Neck:     Thyroid: No thyromegaly.     Vascular: No carotid bruit or JVD.  Cardiovascular:     Rate and  Rhythm: Normal rate and regular rhythm.     Heart sounds: Normal heart sounds. No murmur.  Pulmonary:     Effort: Pulmonary effort is normal. No respiratory distress.     Breath sounds: Normal breath sounds. No wheezing or rales.  Chest:     Chest wall: No tenderness.  Musculoskeletal:     Cervical back: Normal range of motion and neck supple.  Neurological:     Mental Status: She is alert and oriented to person, place, and time.  Psychiatric:        Attention and  Perception: Attention normal.        Mood and Affect: Mood is anxious and depressed. Mood is not elated. Affect is tearful. Affect is not angry.        Speech: Speech normal.        Behavior: Behavior normal.        Thought Content: Thought content is not paranoid or delusional. Thought content does not include homicidal or suicidal ideation. Thought content does not include homicidal or suicidal plan.        Cognition and Memory: Cognition and memory normal.        Judgment: Judgment normal.    BP 112/80 (BP Location: Left Arm, Patient Position: Sitting, Cuff Size: Normal)   Pulse (!) 111   Temp (!) 97.5 F (36.4 C) (Temporal)   Ht 5\' 6"  (1.676 m)   Wt 230 lb (104.3 kg)   SpO2 98%   BMI 37.12 kg/m  Wt Readings from Last 3 Encounters:  06/29/19 230 lb (104.3 kg)  05/04/19 235 lb (106.6 kg)  04/03/19 230 lb (104.3 kg)     Lab Results  Component Value Date   WBC 6.6 02/26/2019   HGB 10.7 (L) 02/26/2019   HCT 33.0 (L) 02/26/2019   PLT 362.0 02/26/2019   GLUCOSE 97 03/02/2019   CHOL 175 12/02/2018   TRIG 115.0 12/02/2018   HDL 45.00 12/02/2018   LDLCALC 107 (H) 12/02/2018   ALT 18 03/02/2019   AST 17 03/02/2019   NA 137 03/02/2019   K 3.7 03/02/2019   CL 101 03/02/2019   CREATININE 0.85 03/02/2019   BUN 12 03/02/2019   CO2 29 03/02/2019   TSH 0.87 02/26/2019   HGBA1C 6.3 12/10/2018    No results found.   Assessment & Plan:  Plan  I have discontinued Relena A. Pirro's metFORMIN, meclizine, methocarbamol, and amoxicillin. I am also having her start on FLUoxetine and ALPRAZolam. Additionally, I am having her maintain her Prenatal/Iron, fluticasone, famotidine, fluconazole, tretinoin, Vitamin D (Ergocalciferol), levocetirizine, and montelukast.  Meds ordered this encounter  Medications  . FLUoxetine (PROZAC) 20 MG tablet    Sig: Take 1 tablet (20 mg total) by mouth daily.    Dispense:  30 tablet    Refill:  3  . ALPRAZolam (XANAX) 0.25 MG tablet    Sig: Take 1  tablet (0.25 mg total) by mouth 2 (two) times daily as needed for anxiety.    Dispense:  20 tablet    Refill:  0    Problem List Items Addressed This Visit    None    Visit Diagnoses    Depression with anxiety    -  Primary   Relevant Medications   FLUoxetine (PROZAC) 20 MG tablet   ALPRAZolam (XANAX) 0.25 MG tablet    counseling scheduled  Start meds  F/u 1 month or sooner prn Pt will be written out of work unless she is allowed to  work from home and then she will be able to work P/T from home while in counseling   Follow-up: Return in about 4 weeks (around 07/27/2019), or if symptoms worsen or fail to improve, for depression anxiety.  Ann Held, DO

## 2019-07-03 ENCOUNTER — Other Ambulatory Visit: Payer: Self-pay | Admitting: Family Medicine

## 2019-07-20 ENCOUNTER — Other Ambulatory Visit: Payer: Self-pay | Admitting: Family Medicine

## 2019-07-20 DIAGNOSIS — F418 Other specified anxiety disorders: Secondary | ICD-10-CM

## 2019-07-20 MED ORDER — ALPRAZOLAM 0.25 MG PO TABS: 0.2500 mg | ORAL_TABLET | Freq: Two times a day (BID) | ORAL | 0 refills | Status: DC | PRN

## 2019-07-20 NOTE — Telephone Encounter (Signed)
Requesting:Alprazolam Contract:none [id number] Last Visit:06/29/2019 Next Visit:07/31/2019 Last Refill:06/29/2019  Patient requesting 90 day supply if possible.   Please Advise

## 2019-07-21 ENCOUNTER — Other Ambulatory Visit: Payer: Self-pay | Admitting: Family Medicine

## 2019-07-21 DIAGNOSIS — F418 Other specified anxiety disorders: Secondary | ICD-10-CM

## 2019-07-21 MED ORDER — ALPRAZOLAM 0.25 MG PO TABS: 0.2500 mg | ORAL_TABLET | Freq: Two times a day (BID) | ORAL | 0 refills | Status: DC | PRN

## 2019-07-21 MED FILL — ALPRAZolam 0.25 MG TABS: 0.25 | 30 days supply | Qty: 60 | Fill #0

## 2019-07-21 NOTE — Telephone Encounter (Signed)
Sent #[phone]

## 2019-07-21 NOTE — Telephone Encounter (Signed)
Could you send in as 90 day supply per patient request?

## 2019-07-28 ENCOUNTER — Other Ambulatory Visit: Payer: Self-pay

## 2019-07-28 ENCOUNTER — Encounter: Payer: Self-pay | Admitting: Family Medicine

## 2019-07-28 ENCOUNTER — Ambulatory Visit (INDEPENDENT_AMBULATORY_CARE_PROVIDER_SITE_OTHER): Payer: Self-pay | Admitting: Family Medicine

## 2019-07-28 ENCOUNTER — Other Ambulatory Visit (HOSPITAL_COMMUNITY)
Admission: RE | Admit: 2019-07-28 | Discharge: 2019-07-28 | Disposition: A | Discharge status: Home / Self Care | Payer: Self-pay | Source: Ambulatory Visit | Attending: Family Medicine | Admitting: Family Medicine

## 2019-07-28 VITALS — BP 100/76 | HR 86 | Temp 97.8°F | Resp 18 | Ht 66.0 in | Wt 227.4 lb

## 2019-07-28 DIAGNOSIS — N926 Irregular menstruation, unspecified: Secondary | ICD-10-CM | POA: Insufficient documentation

## 2019-07-28 DIAGNOSIS — J302 Other seasonal allergic rhinitis: Secondary | ICD-10-CM

## 2019-07-28 DIAGNOSIS — N898 Other specified noninflammatory disorders of vagina: Secondary | ICD-10-CM | POA: Insufficient documentation

## 2019-07-28 DIAGNOSIS — F418 Other specified anxiety disorders: Secondary | ICD-10-CM

## 2019-07-28 LAB — POC URINALSYSI DIPSTICK (AUTOMATED)
Bilirubin, UA: NEGATIVE
Blood, UA: NEGATIVE
Glucose, UA: NEGATIVE
Ketones, UA: NEGATIVE
Leukocytes, UA: NEGATIVE
Nitrite, UA: NEGATIVE
Protein, UA: NEGATIVE
Spec Grav, UA: >=1.03 — AB (ref 1.010–1.025)
Urobilinogen, UA: 0.2 E.U./dL
pH, UA: 5.5 (ref 5.0–8.0)

## 2019-07-28 LAB — POCT URINE PREGNANCY: Preg Test, Ur: NEGATIVE

## 2019-07-28 LAB — HCG, QUANTITATIVE, PREGNANCY: Quantitative HCG: <0.6 m[IU]/mL

## 2019-07-28 MED ORDER — FLUOXETINE HCL 20 MG PO TABS: 20.0000 mg | ORAL_TABLET | Freq: Every day | ORAL | 1 refills | Status: DC

## 2019-07-28 MED ORDER — LEVOCETIRIZINE DIHYDROCHLORIDE 5 MG PO TABS: 5.0000 mg | ORAL_TABLET | Freq: Two times a day (BID) | ORAL | 3 refills | Status: DC

## 2019-07-28 MED ORDER — MONTELUKAST SODIUM 10 MG PO TABS: 10.0000 mg | ORAL_TABLET | Freq: Every day | ORAL | 3 refills | Status: DC

## 2019-07-28 MED FILL — MONTELUKAST SOD 10 MG TAB: 10 | 30 days supply | Qty: 30 | Fill #0

## 2019-07-28 MED FILL — FLUOXETINE HCL 20 MG TABS: 20 | 90 days supply | Qty: 90 | Fill #0

## 2019-07-28 NOTE — Assessment & Plan Note (Signed)
Stable Doing well con't meds con't counseling

## 2019-07-28 NOTE — Assessment & Plan Note (Signed)
Check preg test

## 2019-07-28 NOTE — Progress Notes (Signed)
Patient ID: Katherine Mcfarland, female    DOB: 03-14-1986  Age: 34 y.o. MRN: [id number]    Subjective:  Subjective  HPI Katherine Mcfarland presents for f/u anxiety/ depression   shs states the anxiety is controlled and she is happy with her new job in [place].  She is also c/o vaginal odor and would like urine ancillary done.   She recently got back together with her ex boyfriend.    Review of Systems  Constitutional: Negative for appetite change, diaphoresis, fatigue and unexpected weight change.  Eyes: Negative for pain, redness and visual disturbance.  Respiratory: Negative for cough, chest tightness, shortness of breath and wheezing.   Cardiovascular: Negative for chest pain, palpitations and leg swelling.  Endocrine: Negative for cold intolerance, heat intolerance, polydipsia, polyphagia and polyuria.  Genitourinary: Negative for difficulty urinating, dysuria and frequency.  Neurological: Negative for dizziness, light-headedness, numbness and headaches.  Psychiatric/Behavioral: Negative for behavioral problems, decreased concentration, dysphoric mood, hallucinations, self-injury, sleep disturbance and suicidal ideas. The patient is not nervous/anxious and is not hyperactive.     History Past Medical History:  Diagnosis Date  . Allergy    pt. reports seasonal allergies  . Headache   . Lactose intolerance   . Palpitations   . Right knee pain   . Tinnitus     She has a past surgical history that includes Cesarean section and Wisdom tooth extraction.   Her family history includes Diabetes in her maternal grandmother; Hyperlipidemia in her mother; Hypertension in her father, maternal grandmother, and mother.She reports that she has never smoked. She has never used smokeless tobacco. She reports current alcohol use. She reports that she does not use drugs.  Current Outpatient Medications on File Prior to Visit  Medication Sig Dispense Refill  . ALPRAZolam (XANAX) 0.25 MG tablet Take  1 tablet (0.25 mg total) by mouth 2 (two) times daily as needed for anxiety. 60 tablet 0  . famotidine (PEPCID) 40 MG tablet Take 1 tablet (40 mg total) by mouth daily. 90 tablet 1  . fluticasone (FLONASE) 50 MCG/ACT nasal spray Place 2 sprays into both nostrils daily. 16 g 1  . Prenatal Multivit-Min-Fe-FA (PRENATAL/IRON) TABS Take 1 tablet by mouth daily. 90 tablet 3  . tretinoin (RETIN-A) 0.025 % cream Apply topically at bedtime. 45 g 3  . Vitamin D, Ergocalciferol, (DRISDOL) 1.25 MG (50000 UT) CAPS capsule Take 1 capsule (50,000 Units total) by mouth every 7 (seven) days. 12 capsule 0   No current facility-administered medications on file prior to visit.     Objective:  Objective  Physical Exam Vitals and nursing note reviewed.  Constitutional:      Appearance: She is well-developed.  HENT:     Head: Normocephalic and atraumatic.  Eyes:     Conjunctiva/sclera: Conjunctivae normal.  Neck:     Thyroid: No thyromegaly.     Vascular: No carotid bruit or JVD.  Cardiovascular:     Rate and Rhythm: Normal rate and regular rhythm.     Heart sounds: Normal heart sounds. No murmur.  Pulmonary:     Effort: Pulmonary effort is normal. No respiratory distress.     Breath sounds: Normal breath sounds. No wheezing or rales.  Chest:     Chest wall: No tenderness.  Musculoskeletal:     Cervical back: Normal range of motion and neck supple.  Neurological:     Mental Status: She is alert and oriented to person, place, and time.  Psychiatric:  Attention and Perception: Attention normal.        Mood and Affect: Mood is not anxious or depressed. Affect is not tearful.        Speech: Speech normal.        Behavior: Behavior normal.        Thought Content: Thought content normal.        Cognition and Memory: Cognition normal.        Judgment: Judgment normal.    BP 100/76 (BP Location: Right Arm, Patient Position: Sitting, Cuff Size: Large)   Pulse 86   Temp 97.8 F (36.6 C)  (Temporal)   Resp 18   Ht 5\' 6"  (1.676 m)   Wt 227 lb 6.4 oz (103.1 kg)   SpO2 98%   BMI 36.70 kg/m  Wt Readings from Last 3 Encounters:  07/28/19 227 lb 6.4 oz (103.1 kg)  06/29/19 230 lb (104.3 kg)  05/04/19 235 lb (106.6 kg)     Lab Results  Component Value Date   WBC 6.6 02/26/2019   HGB 10.7 (L) 02/26/2019   HCT 33.0 (L) 02/26/2019   PLT 362.0 02/26/2019   GLUCOSE 97 03/02/2019   CHOL 175 12/02/2018   TRIG 115.0 12/02/2018   HDL 45.00 12/02/2018   LDLCALC 107 (H) 12/02/2018   ALT 18 03/02/2019   AST 17 03/02/2019   NA 137 03/02/2019   K 3.7 03/02/2019   CL 101 03/02/2019   CREATININE 0.85 03/02/2019   BUN 12 03/02/2019   CO2 29 03/02/2019   TSH 0.87 02/26/2019   HGBA1C 6.3 12/10/2018    No results found.   Assessment & Plan:  Plan  I have discontinued Keyundra A. Fager's fluconazole. I am also having her maintain her Prenatal/Iron, fluticasone, famotidine, tretinoin, Vitamin D (Ergocalciferol), ALPRAZolam, FLUoxetine, levocetirizine, and montelukast.  Meds ordered this encounter  Medications  . FLUoxetine (PROZAC) 20 MG tablet    Sig: Take 1 tablet (20 mg total) by mouth daily.    Dispense:  90 tablet    Refill:  1  . levocetirizine (XYZAL) 5 MG tablet    Sig: Take 1 tablet (5 mg total) by mouth 2 (two) times daily.    Dispense:  180 tablet    Refill:  3  . montelukast (SINGULAIR) 10 MG tablet    Sig: Take 1 tablet (10 mg total) by mouth at bedtime.    Dispense:  30 tablet    Refill:  3    Problem List Items Addressed This Visit      Unprioritized   Depression with anxiety    Stable Doing well con't meds con't counseling        Relevant Medications   FLUoxetine (PROZAC) 20 MG tablet   Late period    Check preg test       Relevant Orders   POCT urine pregnancy (Completed)   hCG, quantitative, pregnancy   Seasonal allergies   Relevant Medications   levocetirizine (XYZAL) 5 MG tablet   montelukast (SINGULAIR) 10 MG tablet   Vaginal  odor - Primary    Check urine ancillary      Relevant Orders   Urine cytology ancillary only([place])   POCT urine pregnancy (Completed)   POCT Urinalysis Dipstick (Automated) (Completed)      Follow-up: No follow-ups on file.  Ann Held, DO

## 2019-07-28 NOTE — Assessment & Plan Note (Signed)
Check urine ancillary

## 2019-07-31 ENCOUNTER — Ambulatory Visit: Payer: Self-pay | Admitting: Family Medicine

## 2019-08-03 ENCOUNTER — Other Ambulatory Visit: Payer: Self-pay | Admitting: Family Medicine

## 2019-08-03 DIAGNOSIS — B9689 Other specified bacterial agents as the cause of diseases classified elsewhere: Secondary | ICD-10-CM

## 2019-08-03 DIAGNOSIS — N760 Acute vaginitis: Secondary | ICD-10-CM

## 2019-08-03 LAB — URINE CYTOLOGY ANCILLARY ONLY
Bacterial Vaginitis-Urine: POSITIVE — AB
Bacterial Vaginitis-Urine: POSITIVE — AB
Bacterial Vaginitis-Urine: POSITIVE — AB
Candida Urine: NEGATIVE
Chlamydia: NEGATIVE
Comment: NEGATIVE
Comment: NEGATIVE
Comment: NORMAL
Neisseria Gonorrhea: NEGATIVE
Trichomonas: NEGATIVE

## 2019-08-03 MED ORDER — METRONIDAZOLE 0.75 % VA GEL: 1.0000 | Freq: Every day | VAGINAL | 0 refills | Status: DC

## 2019-08-03 MED ORDER — CLINDAMYCIN HCL 300 MG PO CAPS: 300.0000 mg | ORAL_CAPSULE | Freq: Two times a day (BID) | ORAL | 0 refills | Status: AC

## 2019-08-04 MED FILL — CLINDAMYCIN HCL 300 MG CAP: 300 | 7 days supply | Qty: 14 | Fill #0

## 2019-08-25 ENCOUNTER — Encounter: Payer: Self-pay | Admitting: Family Medicine

## 2019-08-25 DIAGNOSIS — L700 Acne vulgaris: Secondary | ICD-10-CM

## 2019-08-25 DIAGNOSIS — J302 Other seasonal allergic rhinitis: Secondary | ICD-10-CM

## 2019-08-25 DIAGNOSIS — E1165 Type 2 diabetes mellitus with hyperglycemia: Secondary | ICD-10-CM

## 2019-08-25 MED ORDER — PRENATAL/IRON PO TABS: 1.0000 | ORAL_TABLET | Freq: Every day | ORAL | 1 refills | Status: DC

## 2019-08-25 MED ORDER — TRETINOIN 0.025 % EX CREA: TOPICAL_CREAM | Freq: Every day | CUTANEOUS | 3 refills | Status: DC

## 2019-08-25 MED ORDER — MONTELUKAST SODIUM 10 MG PO TABS: 10.0000 mg | ORAL_TABLET | Freq: Every day | ORAL | 1 refills | Status: DC

## 2019-08-25 MED ORDER — FLUTICASONE PROPIONATE 50 MCG/ACT NA SUSP: 2.0000 | Freq: Every day | NASAL | 1 refills | Status: DC

## 2019-08-25 MED FILL — FLUTICASONE PROP 50 MCG SPR: 50 | 60 days supply | Qty: 32 | Fill #0

## 2019-08-25 MED FILL — MONTELUKAST SOD 10 MG TAB: 10 | 90 days supply | Qty: 90 | Fill #0

## 2019-08-25 MED FILL — PRENATAL FE 27-1 MG TAB: 27-1 | 90 days supply | Qty: 90 | Fill #0

## 2019-08-25 MED FILL — RETIN-A 0.025 % CREA: 0.025 | 30 days supply | Qty: 45 | Fill #0

## 2019-08-25 NOTE — Telephone Encounter (Signed)
Can you make sure the flonase is correctly dosed for 2 bottles?

## 2019-10-08 ENCOUNTER — Ambulatory Visit: Payer: Self-pay | Admitting: Family Medicine

## 2019-10-09 ENCOUNTER — Encounter: Payer: Self-pay | Admitting: Family Medicine

## 2019-10-09 DIAGNOSIS — E1165 Type 2 diabetes mellitus with hyperglycemia: Secondary | ICD-10-CM

## 2019-10-09 MED ORDER — FAMOTIDINE 40 MG PO TABS: 40.0000 mg | ORAL_TABLET | Freq: Every day | ORAL | 1 refills | Status: DC

## 2019-10-09 MED FILL — FAMOTIDINE 40 MG TABLET: 40 | 90 days supply | Qty: 90 | Fill #0

## 2019-10-28 ENCOUNTER — Encounter: Payer: Self-pay | Admitting: Family Medicine

## 2019-10-28 ENCOUNTER — Telehealth (INDEPENDENT_AMBULATORY_CARE_PROVIDER_SITE_OTHER): Payer: Self-pay | Admitting: Family Medicine

## 2019-10-28 VITALS — BP 122/84 | HR 75 | Wt 215.0 lb

## 2019-10-28 DIAGNOSIS — R739 Hyperglycemia, unspecified: Secondary | ICD-10-CM

## 2019-10-28 DIAGNOSIS — Z7251 High risk heterosexual behavior: Secondary | ICD-10-CM

## 2019-10-28 NOTE — Progress Notes (Signed)
Virtual Visit via Video Note  I connected with [name] on 10/28/19 at 11:40 AM EDT by a video enabled telemedicine application and verified that I am speaking with the correct person using two identifiers.  Location: Patient: home alone  Provider: home    I discussed the limitations of evaluation and management by telemedicine and the availability of in person appointments. The patient expressed understanding and agreed to proceed.  History of Present Illness: Pt is home doing well.  She is liking her schedule at work.  She would like labs rechecked since she has lost 50 lbs and she needs std testing as well    No symptoms    Observations/Objective: Vitals:   10/28/19 1101  BP: 122/84  Pulse: 75   Wt 215 lbs  Pt has lost 50 lbs --   Assessment and Plan: 1. 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 Hyperglycemia Doing well with diet  Recheck labs  - Lipid panel; Future - Hemoglobin A1c; Future - Comprehensive metabolic panel; Future - TSH; Future - HSV Type I/II IgG, IgMw/ reflex; Future - RPR; Future - Hepatitis B surface antibody,quantitative; Future   Follow Up Instructions:    I discussed the assessment and treatment plan with the patient. The patient was provided an opportunity to ask questions and all were answered. The patient agreed with the plan and demonstrated an understanding of the instructions.   The patient was advised to call back or seek an in-person evaluation if the symptoms worsen or if the condition fails to improve as anticipated.  I provided 25 minutes of non-face-to-face time during this encounter.   Ann Held, DO

## 2019-10-29 ENCOUNTER — Other Ambulatory Visit (HOSPITAL_COMMUNITY)
Admission: RE | Admit: 2019-10-29 | Discharge: 2019-10-29 | Disposition: A | Discharge status: Home / Self Care | Payer: Self-pay | Source: Ambulatory Visit | Attending: Family Medicine | Admitting: Family Medicine

## 2019-10-29 ENCOUNTER — Other Ambulatory Visit (INDEPENDENT_AMBULATORY_CARE_PROVIDER_SITE_OTHER): Payer: Self-pay

## 2019-10-29 ENCOUNTER — Other Ambulatory Visit: Payer: Self-pay

## 2019-10-29 DIAGNOSIS — R739 Hyperglycemia, unspecified: Secondary | ICD-10-CM

## 2019-10-29 DIAGNOSIS — Z7251 High risk heterosexual behavior: Secondary | ICD-10-CM

## 2019-10-29 LAB — COMPREHENSIVE METABOLIC PANEL
ALT: 16 U/L (ref 0–35)
AST: 19 U/L (ref 0–37)
Albumin: 4.1 g/dL (ref 3.5–5.2)
Alkaline Phosphatase: 62 U/L (ref 39–117)
BUN: 9 mg/dL (ref 6–23)
CO2: 26 mEq/L (ref 19–32)
Calcium: 9.1 mg/dL (ref 8.4–10.5)
Chloride: 105 mEq/L (ref 96–112)
Creatinine, Ser: 0.76 mg/dL (ref 0.40–1.20)
GFR: 104.01 mL/min (ref >60.00)
Glucose, Bld: 113 mg/dL — ABNORMAL HIGH (ref 70–99)
Potassium: 3.8 mEq/L (ref 3.5–5.1)
Sodium: 137 mEq/L (ref 135–145)
Total Bilirubin: 0.3 mg/dL (ref 0.2–1.2)
Total Protein: 6.8 g/dL (ref 6.0–8.3)

## 2019-10-29 LAB — LIPID PANEL
Cholesterol: 148 mg/dL (ref 0–200)
HDL: 35.4 mg/dL — ABNORMAL LOW (ref >39.00)
LDL Cholesterol: 94 mg/dL (ref 0–99)
NonHDL: 112.56
Total CHOL/HDL Ratio: 4
Triglycerides: 93 mg/dL (ref 0.0–149.0)
VLDL: 18.6 mg/dL (ref 0.0–40.0)

## 2019-10-29 LAB — TSH: TSH: 1.24 u[IU]/mL (ref 0.35–4.50)

## 2019-10-29 LAB — HEMOGLOBIN A1C: Hgb A1c MFr Bld: 5.8 % (ref 4.6–6.5)

## 2019-10-30 LAB — HSV TYPE I/II IGG, IGMW/ REFLEX
HSV 1 Glycoprotein G Ab, IgG: 36.8 index — ABNORMAL HIGH (ref 0.00–0.90)
HSV 1 IgM: <1:10 {titer}
HSV 2 IgG, Type Spec: <0.91 index (ref 0.00–0.90)
HSV 2 IgM: <1:10 {titer}

## 2019-10-30 LAB — HCG, SERUM, QUALITATIVE: Preg, Serum: NEGATIVE

## 2019-10-30 LAB — HIV ANTIBODY (ROUTINE TESTING W REFLEX): HIV 1&2 Ab, 4th Generation: NONREACTIVE

## 2019-10-30 LAB — HEPATITIS B SURFACE ANTIBODY, QUANTITATIVE: Hep B S AB Quant (Post): <5 m[IU]/mL — ABNORMAL LOW (ref >=10)

## 2019-11-02 LAB — URINE CYTOLOGY ANCILLARY ONLY
Bacterial Vaginitis-Urine: NEGATIVE
Candida Urine: NEGATIVE
Chlamydia: NEGATIVE
Comment: NEGATIVE
Comment: NEGATIVE
Comment: NORMAL
Neisseria Gonorrhea: NEGATIVE
Trichomonas: NEGATIVE

## 2020-01-11 ENCOUNTER — Encounter: Payer: Self-pay | Admitting: Family Medicine

## 2020-01-11 DIAGNOSIS — E1165 Type 2 diabetes mellitus with hyperglycemia: Secondary | ICD-10-CM

## 2020-01-11 MED ORDER — FAMOTIDINE 40 MG PO TABS: 40.0000 mg | ORAL_TABLET | Freq: Every day | ORAL | 3 refills | Status: DC

## 2020-01-11 MED FILL — FAMOTIDINE 40 MG TABLET: 40 | 90 days supply | Qty: 90 | Fill #0

## 2020-03-09 ENCOUNTER — Other Ambulatory Visit: Payer: Self-pay

## 2020-03-09 ENCOUNTER — Encounter: Payer: Self-pay | Admitting: Family Medicine

## 2020-03-09 DIAGNOSIS — E559 Vitamin D deficiency, unspecified: Secondary | ICD-10-CM

## 2020-03-09 DIAGNOSIS — E1165 Type 2 diabetes mellitus with hyperglycemia: Secondary | ICD-10-CM

## 2020-03-09 MED ORDER — VITAMIN D (ERGOCALCIFEROL) 1.25 MG (50000 UNIT) PO CAPS: 50000.0000 [IU] | ORAL_CAPSULE | ORAL | 0 refills | Status: DC

## 2020-03-09 MED ORDER — PRENATAL/IRON PO TABS: 1.0000 | ORAL_TABLET | Freq: Every day | ORAL | 1 refills | Status: DC

## 2020-03-09 MED FILL — PRENATAL 27-1 MG TABS: 27-1 | 30 days supply | Qty: 30 | Fill #0

## 2020-03-09 MED FILL — VIT D2 1.25 MG (50,000 UNIT: 1.25 MG | 84 days supply | Qty: 12 | Fill #0

## 2020-03-09 NOTE — Telephone Encounter (Signed)
Medications refilled to pharmacy downstairs

## 2020-03-24 ENCOUNTER — Encounter: Payer: Self-pay | Admitting: Family Medicine

## 2020-04-25 ENCOUNTER — Other Ambulatory Visit: Payer: Self-pay

## 2020-04-25 ENCOUNTER — Other Ambulatory Visit: Payer: Self-pay | Admitting: Family Medicine

## 2020-04-25 DIAGNOSIS — E1165 Type 2 diabetes mellitus with hyperglycemia: Secondary | ICD-10-CM

## 2020-04-25 MED ORDER — FAMOTIDINE 40 MG PO TABS: 40.0000 mg | ORAL_TABLET | Freq: Every day | ORAL | 3 refills | Status: DC

## 2020-04-25 MED FILL — FAMOTIDINE 40 MG TABLET: 40 | 90 days supply | Qty: 90 | Fill #0

## 2020-04-25 NOTE — Telephone Encounter (Signed)
Patient sent message requesting refill on Pepcid. Refill sent to pharmacy downstairs.

## 2020-04-28 ENCOUNTER — Other Ambulatory Visit: Payer: Self-pay | Admitting: Unknown Physician Specialty

## 2020-04-28 ENCOUNTER — Telehealth: Payer: Self-pay | Admitting: Unknown Physician Specialty

## 2020-04-28 DIAGNOSIS — R002 Palpitations: Secondary | ICD-10-CM

## 2020-04-28 NOTE — Telephone Encounter (Signed)
I connected by phone with Katherine Mcfarland on 04/28/2020 at 10:11 AM to discuss the potential use of a new treatment for mild to moderate COVID-19 viral infection in non-hospitalized patients.  This patient is a 34 y.o. female that meets the FDA criteria for Emergency Use Authorization of COVID monoclonal antibody casirivimab/imdevimab, bamlanivimab/etesevimab, or sotrovimab.  Has a (+) direct SARS-CoV-2 viral test result  Has mild or moderate COVID-19   Is NOT hospitalized due to COVID-19  Is within 10 days of symptom onset  Has at least one of the high risk factor(s) for progression to severe COVID-19 and/or hospitalization as defined in EUA.  Specific high risk criteria : BMI > 25 and Diabetes   I have spoken and communicated the following to the patient or parent/caregiver regarding COVID monoclonal antibody treatment:  1. FDA has authorized the emergency use for the treatment of mild to moderate COVID-19 in adults and pediatric patients with positive results of direct SARS-CoV-2 viral testing who are 63 years of age and older weighing at least 40 kg, and who are at high risk for progressing to severe COVID-19 and/or hospitalization.  2. The significant known and potential risks and benefits of COVID monoclonal antibody, and the extent to which such potential risks and benefits are unknown.  3. Information on available alternative treatments and the risks and benefits of those alternatives, including clinical trials.  4. Patients treated with COVID monoclonal antibody should continue to self-isolate and use infection control measures (e.g., wear mask, isolate, social distance, avoid sharing personal items, clean and disinfect "high touch" surfaces, and frequent handwashing) according to CDC guidelines.   5. The patient or parent/caregiver has the option to accept or refuse COVID monoclonal antibody treatment.  After reviewing this information with the patient, the patient has agreed  to receive one of the available covid 19 monoclonal antibodies and will be provided an appropriate fact sheet prior to infusion. Kathrine Haddock, NP 04/28/2020 10:11 AM  Sx onset 12/14

## 2020-04-29 ENCOUNTER — Ambulatory Visit (HOSPITAL_COMMUNITY)
Admission: RE | Admit: 2020-04-29 | Discharge: 2020-04-29 | Disposition: A | Discharge status: Home / Self Care | Payer: Self-pay | Source: Ambulatory Visit | Attending: Pulmonary Disease | Admitting: Pulmonary Disease

## 2020-04-29 DIAGNOSIS — R002 Palpitations: Secondary | ICD-10-CM | POA: Insufficient documentation

## 2020-04-29 MED ORDER — ALBUTEROL SULFATE HFA 108 (90 BASE) MCG/ACT IN AERS: 2.0000 | INHALATION_SPRAY | Freq: Once | RESPIRATORY_TRACT | Status: DC | PRN

## 2020-04-29 MED ORDER — METHYLPREDNISOLONE SODIUM SUCC 125 MG IJ SOLR: 125.0000 mg | Freq: Once | INTRAMUSCULAR | Status: DC | PRN

## 2020-04-29 MED ORDER — SODIUM CHLORIDE 0.9 % IV SOLN: INTRAVENOUS | Status: DC | PRN

## 2020-04-29 MED ORDER — ONDANSETRON HCL 4 MG/2ML IJ SOLN
4.0000 mg | Freq: Once | INTRAMUSCULAR | Status: AC
  Administered 2020-04-29: 4 mg via INTRAVENOUS
  Filled 2020-04-29: qty 2

## 2020-04-29 MED ORDER — SODIUM CHLORIDE 0.9 % IV SOLN: Freq: Once | INTRAVENOUS | Status: AC

## 2020-04-29 MED ORDER — FAMOTIDINE IN NACL 20-0.9 MG/50ML-% IV SOLN: 20.0000 mg | Freq: Once | INTRAVENOUS | Status: DC | PRN

## 2020-04-29 MED ORDER — EPINEPHRINE 0.3 MG/0.3ML IJ SOAJ: 0.3000 mg | Freq: Once | INTRAMUSCULAR | Status: DC | PRN

## 2020-04-29 MED ORDER — DIPHENHYDRAMINE HCL 50 MG/ML IJ SOLN: 50.0000 mg | Freq: Once | INTRAMUSCULAR | Status: DC | PRN

## 2020-04-29 NOTE — Progress Notes (Signed)
Patient reviewed Fact Sheet for Patients, Parents, and Caregivers for Emergency Use Authorization (EUA) of bamlanivimab and etesevimab for the Treatment of Coronavirus. Patient also reviewed and is agreeable to the estimated cost of treatment. Patient is agreeable to proceed.   

## 2020-04-29 NOTE — Progress Notes (Signed)
  Diagnosis: COVID-19  Physician:Dr Joya Gaskins   Procedure: [facility] Med: bamlanivimab\etesevimab infusion - Provided patient with bamlanimivab\etesevimab fact sheet for patients, parents and caregivers prior to infusion.  Complications: No immediate complications noted.  Discharge: Discharged home   Dewaine Oats 04/29/2020

## 2020-04-29 NOTE — Discharge Instructions (Signed)
10 Things You Can Do to Manage Your COVID-19 Symptoms at Home If you have possible or confirmed COVID-19: 1. Stay home from work and school. And stay away from other public places. If you must go out, avoid using any kind of public transportation, ridesharing, or taxis. 2. Monitor your symptoms carefully. If your symptoms get worse, call your healthcare provider immediately. 3. Get rest and stay hydrated. 4. If you have a medical appointment, call the healthcare provider ahead of time and tell them that you have or may have COVID-19. 5. For medical emergencies, call 911 and notify the dispatch personnel that you have or may have COVID-19. 6. Cover your cough and sneezes with a tissue or use the inside of your elbow. 7. Wash your hands often with soap and water for at least 20 seconds or clean your hands with an alcohol-based hand sanitizer that contains at least 60% alcohol. 8. As much as possible, stay in a specific room and away from other people in your home. Also, you should use a separate bathroom, if available. If you need to be around other people in or outside of the home, wear a mask. 9. Avoid sharing personal items with other people in your household, like dishes, towels, and bedding. 10. Clean all surfaces that are touched often, like counters, tabletops, and doorknobs. Use household cleaning sprays or wipes according to the label instructions. cdc.gov/coronavirus 11/12/2018 This information is not intended to replace advice given to you by your health care provider. Make sure you discuss any questions you have with your health care provider. Document Revised: 04/16/2019 Document Reviewed: 04/16/2019 Elsevier Patient Education  2020 Elsevier Inc. What types of side effects do monoclonal antibody drugs cause?  Common side effects  In general, the more common side effects caused by monoclonal antibody drugs include: . Allergic reactions, such as hives or itching . Flu-like signs and  symptoms, including chills, fatigue, fever, and muscle aches and pains . Nausea, vomiting . Diarrhea . Skin rashes . Low blood pressure   The CDC is recommending patients who receive monoclonal antibody treatments wait at least 90 days before being vaccinated.  Currently, there are no data on the safety and efficacy of mRNA COVID-19 vaccines in persons who received monoclonal antibodies or convalescent plasma as part of COVID-19 treatment. Based on the estimated half-life of such therapies as well as evidence suggesting that reinfection is uncommon in the 90 days after initial infection, vaccination should be deferred for at least 90 days, as a precautionary measure until additional information becomes available, to avoid interference of the antibody treatment with vaccine-induced immune responses. If you have any questions or concerns after the infusion please call the Advanced Practice Provider on call at [phone]. This number is ONLY intended for your use regarding questions or concerns about the infusion post-treatment side-effects.  Please do not provide this number to others for use. For return to work notes please contact your primary care provider.   If someone you know is interested in receiving treatment please have them call the COVID hotline at [phone].   

## 2020-06-07 ENCOUNTER — Encounter: Payer: Self-pay | Admitting: Family Medicine

## 2020-06-20 ENCOUNTER — Other Ambulatory Visit: Payer: Self-pay

## 2020-06-20 ENCOUNTER — Encounter: Payer: Self-pay | Admitting: Family Medicine

## 2020-06-20 ENCOUNTER — Other Ambulatory Visit (HOSPITAL_BASED_OUTPATIENT_CLINIC_OR_DEPARTMENT_OTHER): Payer: Self-pay | Admitting: Family Medicine

## 2020-06-20 ENCOUNTER — Telehealth (INDEPENDENT_AMBULATORY_CARE_PROVIDER_SITE_OTHER): Payer: Self-pay | Admitting: Family Medicine

## 2020-06-20 DIAGNOSIS — E1165 Type 2 diabetes mellitus with hyperglycemia: Secondary | ICD-10-CM

## 2020-06-20 DIAGNOSIS — J302 Other seasonal allergic rhinitis: Secondary | ICD-10-CM

## 2020-06-20 MED ORDER — FLUTICASONE PROPIONATE 50 MCG/ACT NA SUSP: 2.0000 | Freq: Every day | NASAL | 1 refills | Status: DC

## 2020-06-20 MED ORDER — LEVOCETIRIZINE DIHYDROCHLORIDE 5 MG PO TABS: 5.0000 mg | ORAL_TABLET | Freq: Two times a day (BID) | ORAL | 3 refills | Status: DC

## 2020-06-20 MED ORDER — PRENATAL/IRON PO TABS
1.0000 | ORAL_TABLET | Freq: Every day | ORAL | 1 refills | Status: DC
Start: 2020-06-20 — End: 2021-01-24

## 2020-06-20 MED ORDER — MONTELUKAST SODIUM 10 MG PO TABS: 10.0000 mg | ORAL_TABLET | Freq: Every day | ORAL | 1 refills | Status: DC

## 2020-06-20 MED ORDER — FAMOTIDINE 40 MG PO TABS: 40.0000 mg | ORAL_TABLET | Freq: Every day | ORAL | 3 refills | Status: DC

## 2020-06-20 MED FILL — MONTELUKAST SOD 10 MG TAB: 10 | 90 days supply | Qty: 90 | Fill #0

## 2020-06-20 MED FILL — LEVOCETIRIZINE 5 MG TABLET: 5 | 60 days supply | Qty: 180 | Fill #0

## 2020-06-20 MED FILL — FAMOTIDINE 40 MG TABLET: 40 | 90 days supply | Qty: 90 | Fill #0

## 2020-06-20 MED FILL — FLUTICASONE PROP 50 MCG SPR: 50 | 60 days supply | Qty: 32 | Fill #0

## 2020-06-20 MED FILL — PRENATAL VITAMIN PLUS LOW I: 27-1 | 100 days supply | Qty: 100 | Fill #0

## 2020-06-20 NOTE — Progress Notes (Signed)
Virtual Visit via Video Note  I connected with Katherine Mcfarland on 06/20/20 at 10:40 AM EST by a video enabled telemedicine application and verified that I am speaking with the correct person using two identifiers.  Location/ participants in visit  Patient: home alone  Provider: office    I discussed the limitations of evaluation and management by telemedicine and the availability of in person appointments. The patient expressed understanding and agreed to proceed.  History of Present Illness: Pt is home ---  Doing well.  She just needs refills on her meds before her ins runs out  No complaints  She is not checking bs but has lost more weight    Observations/Objective: Vitals:   06/20/20 1026  Temp: 97.7 F (36.5 C)   Wt 202 lbs  Pt is in nad No sob   Assessment and Plan: 1. Uncontrolled type 2 diabetes mellitus with hyperglycemia ([place]) Check labs  On no meds con't diet and exercise  - famotidine (PEPCID) 40 MG tablet; Take 1 tablet (40 mg total) by mouth daily.  Dispense: 90 tablet; Refill: 3 - Prenatal Multivit-Min-Fe-FA (PRENATAL/IRON) TABS; Take 1 tablet by mouth daily.  Dispense: 90 tablet; Refill: 1 - Lipid panel; Future - Hemoglobin A1c; Future - Comprehensive metabolic panel; Future  2. Seasonal allergies Stable  Refill meds  - levocetirizine (XYZAL) 5 MG tablet; Take 1 tablet (5 mg total) by mouth 2 (two) times daily.  Dispense: 180 tablet; Refill: 3 - montelukast (SINGULAIR) 10 MG tablet; Take 1 tablet (10 mg total) by mouth at bedtime.  Dispense: 90 tablet; Refill: 1 - Lipid panel; Future - Hemoglobin A1c; Future - Comprehensive metabolic panel; Future   Follow Up Instructions:    I discussed the assessment and treatment plan with the patient. The patient was provided an opportunity to ask questions and all were answered. The patient agreed with the plan and demonstrated an understanding of the instructions.   The patient was advised to call back or seek  an in-person evaluation if the symptoms worsen or if the condition fails to improve as anticipated.  I provided 25 minutes of non-face-to-face time during this encounter.   Ann Held, DO

## 2020-06-20 NOTE — Progress Notes (Deleted)
Patient ID: Katherine Mcfarland, female    DOB: [birth year]  Age: 35 y.o. MRN: [id number]    Subjective:  Subjective  HPI Melissaann KIMBERLEA SCHLAG presents for f/u and refills on allergy meds    No complaints   Review of Systems  Constitutional: Negative for appetite change, diaphoresis, fatigue and unexpected weight change.  Eyes: Negative for pain, redness and visual disturbance.  Respiratory: Negative for cough, chest tightness, shortness of breath and wheezing.   Cardiovascular: Negative for chest pain, palpitations and leg swelling.  Endocrine: Negative for cold intolerance, heat intolerance, polydipsia, polyphagia and polyuria.  Genitourinary: Negative for difficulty urinating, dysuria and frequency.  Neurological: Negative for dizziness, light-headedness, numbness and headaches.    History Past Medical History:  Diagnosis Date  . Allergy    pt. reports seasonal allergies  . Headache   . Lactose intolerance   . Palpitations   . Right knee pain   . Tinnitus     She has a past surgical history that includes Cesarean section and Wisdom tooth extraction.   Her family history includes Diabetes in her maternal grandmother; Hyperlipidemia in her mother; Hypertension in her father, maternal grandmother, and mother.She reports that she has never smoked. She has never used smokeless tobacco. She reports current alcohol use. She reports that she does not use drugs.  Current Outpatient Medications on File Prior to Visit  Medication Sig Dispense Refill  . tretinoin (RETIN-A) 0.025 % cream Apply topically at bedtime. 45 g 3  . Vitamin D, Ergocalciferol, (DRISDOL) 1.25 MG (50000 UNIT) CAPS capsule Take 1 capsule (50,000 Units total) by mouth every 7 (seven) days. 12 capsule 0   No current facility-administered medications on file prior to visit.     Objective:  Objective  Physical Exam Vitals and nursing note reviewed.  Constitutional:      Appearance: She is well-developed and  well-nourished.  HENT:     Head: Normocephalic and atraumatic.  Eyes:     Extraocular Movements: EOM normal.     Conjunctiva/sclera: Conjunctivae normal.  Neck:     Thyroid: No thyromegaly.     Vascular: No carotid bruit or JVD.  Cardiovascular:     Rate and Rhythm: Normal rate and regular rhythm.     Heart sounds: Normal heart sounds. No murmur heard.   Pulmonary:     Effort: Pulmonary effort is normal. No respiratory distress.     Breath sounds: Normal breath sounds. No wheezing or rales.  Chest:     Chest wall: No tenderness.  Musculoskeletal:        General: No edema.     Cervical back: Normal range of motion and neck supple.  Neurological:     Mental Status: She is alert and oriented to person, place, and time.  Psychiatric:        Mood and Affect: Mood and affect normal.    Temp 97.7 F (36.5 C)   Ht 5\' 6"  (1.676 m)   Wt 202 lb (91.6 kg)   BMI 32.60 kg/m  Wt Readings from Last 3 Encounters:  06/20/20 202 lb (91.6 kg)  10/28/19 215 lb (97.5 kg)  07/28/19 227 lb 6.4 oz (103.1 kg)     Lab Results  Component Value Date   WBC 6.6 02/26/2019   HGB 10.7 (L) 02/26/2019   HCT 33.0 (L) 02/26/2019   PLT 362.0 02/26/2019   GLUCOSE 113 (H) 10/29/2019   CHOL 148 10/29/2019   TRIG 93.0 10/29/2019   HDL 35.40 (L) 10/29/2019  LDLCALC 94 10/29/2019   ALT 16 10/29/2019   AST 19 10/29/2019   NA 137 10/29/2019   K 3.8 10/29/2019   CL 105 10/29/2019   CREATININE 0.76 10/29/2019   BUN 9 10/29/2019   CO2 26 10/29/2019   TSH 1.24 10/29/2019   HGBA1C 5.8 10/29/2019    No results found.   Assessment & Plan:  Plan  I am having Camyla A. Baena maintain her tretinoin, Vitamin D (Ergocalciferol), famotidine, fluticasone, levocetirizine, montelukast, and Prenatal/Iron.  Meds ordered this encounter  Medications  . famotidine (PEPCID) 40 MG tablet    Sig: Take 1 tablet (40 mg total) by mouth daily.    Dispense:  90 tablet    Refill:  3  . fluticasone (FLONASE) 50  MCG/ACT nasal spray    Sig: Place 2 sprays into both nostrils daily.    Dispense:  32 g    Refill:  1  . levocetirizine (XYZAL) 5 MG tablet    Sig: Take 1 tablet (5 mg total) by mouth 2 (two) times daily.    Dispense:  180 tablet    Refill:  3  . montelukast (SINGULAIR) 10 MG tablet    Sig: Take 1 tablet (10 mg total) by mouth at bedtime.    Dispense:  90 tablet    Refill:  1  . Prenatal Multivit-Min-Fe-FA (PRENATAL/IRON) TABS    Sig: Take 1 tablet by mouth daily.    Dispense:  90 tablet    Refill:  1    Problem List Items Addressed This Visit      Unprioritized   Seasonal allergies   Relevant Medications   levocetirizine (XYZAL) 5 MG tablet   montelukast (SINGULAIR) 10 MG tablet    Other Visit Diagnoses    Uncontrolled type 2 diabetes mellitus with hyperglycemia ([facility])       Relevant Medications   famotidine (PEPCID) 40 MG tablet   Prenatal Multivit-Min-Fe-FA (PRENATAL/IRON) TABS      Follow-up: No follow-ups on file.  Ann Held, DO

## 2020-06-28 ENCOUNTER — Other Ambulatory Visit: Payer: Self-pay

## 2020-08-08 MED FILL — PRENATAL VITAMIN PLUS LOW I: 27-1 | 100 days supply | Qty: 100 | Fill #0

## 2020-08-08 MED FILL — LEVOCETIRIZINE 5 MG TABLET: 5 | 60 days supply | Qty: 180 | Fill #0

## 2020-08-08 MED FILL — MONTELUKAST SOD 10 MG TAB: 10 | 90 days supply | Qty: 90 | Fill #0

## 2020-08-08 MED FILL — FAMOTIDINE 40 MG TABLET: 40 | 90 days supply | Qty: 90 | Fill #0

## 2020-08-08 MED FILL — FLUTICASONE PROP 50 MCG SPR: 50 | 60 days supply | Qty: 32 | Fill #0

## 2020-08-12 ENCOUNTER — Encounter: Payer: Self-pay | Admitting: Family Medicine

## 2020-08-12 NOTE — Telephone Encounter (Signed)
If its unbearable you really need to be seen somewhere-----  gyn may be able to do [place] in office maybe? But we are happy to see you too.

## 2020-09-17 IMAGING — US US BREAST*R* LIMITED INC AXILLA
1 series · 8 of 8 positions shown · non-contrast
Comparison: None

*** End of Addendum ***
COMPARISON: Previous exam(s).

Addendum:

ADDENDUM:
CLINICAL DATA: 33-year-old female presenting with new palpable
areas of concern in the bilateral breasts.
EXAM:
DIGITAL DIAGNOSTIC BILATERAL MAMMOGRAM WITH TOMO
ULTRASOUND BILATERAL BREAST

[Series 1: us breast*right* limited inc axilla · 0.07mm/px · 8 of 8 slices shown]
[im 1/8]
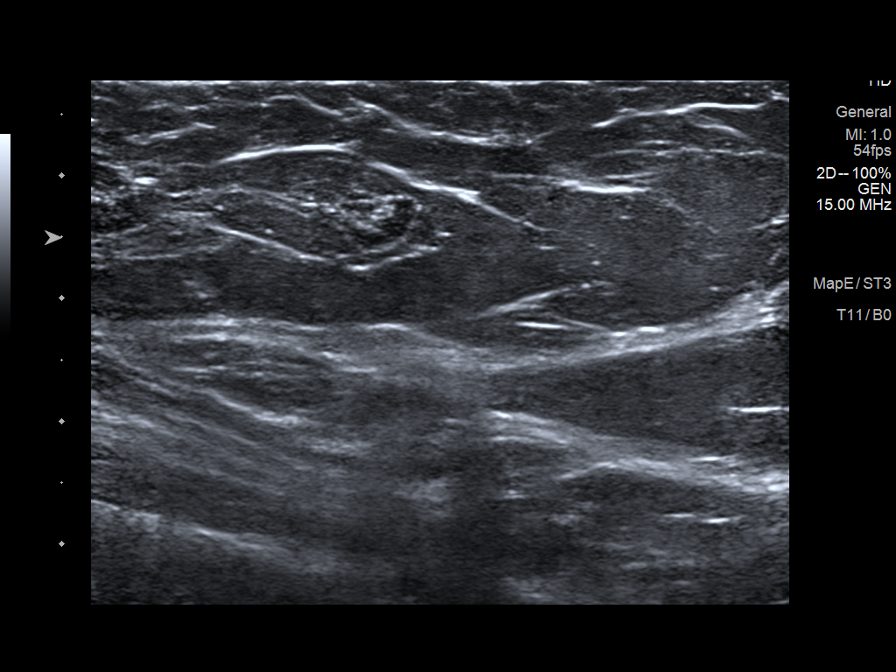
[im 2/8]
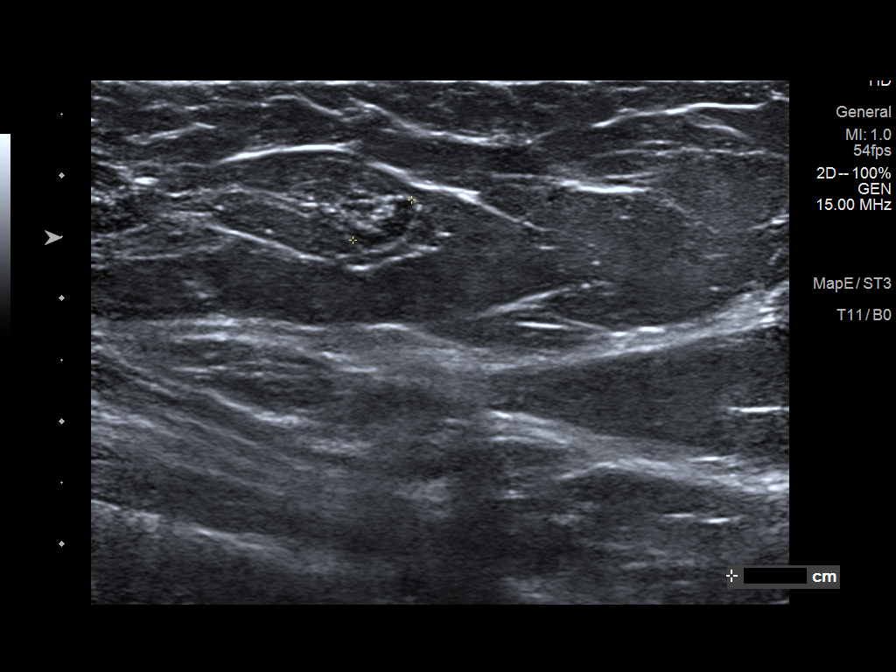
[im 3/8]
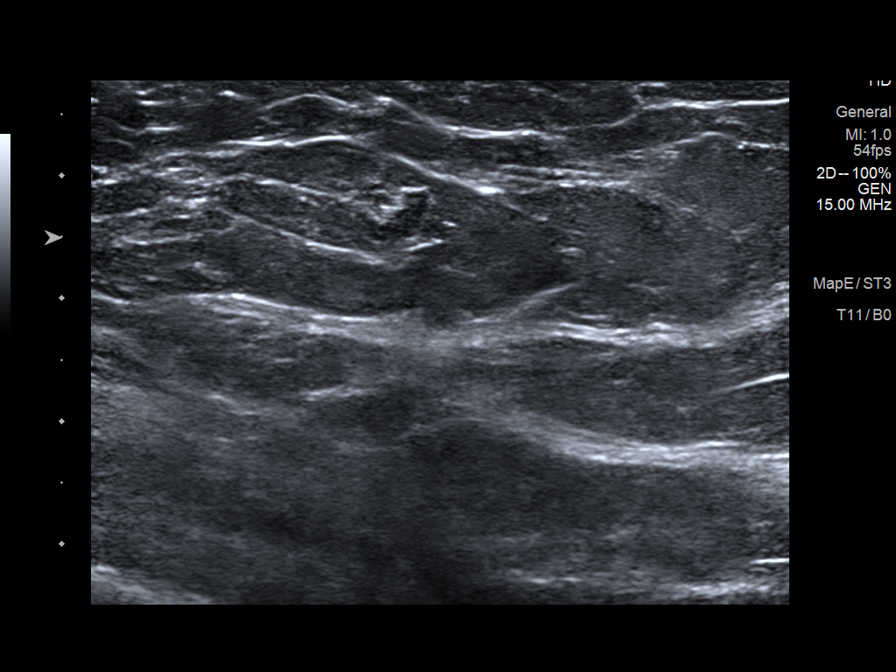
[im 4/8]
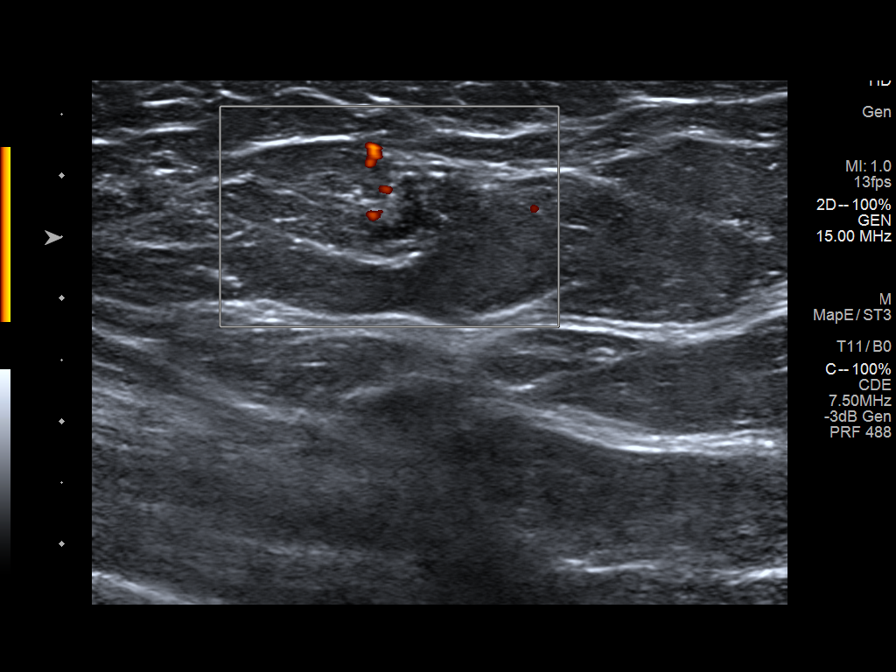
[im 5/8]
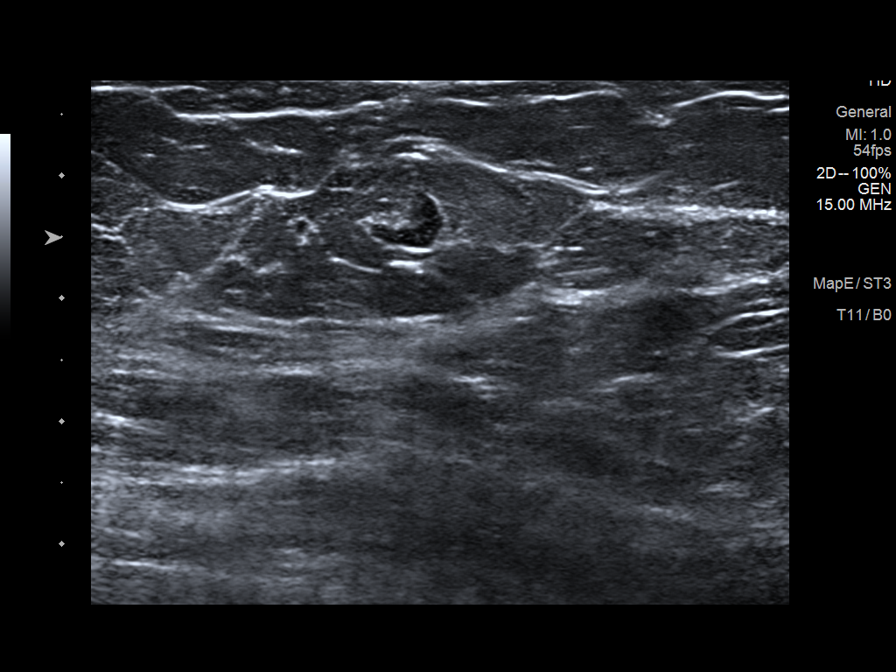
[im 6/8]
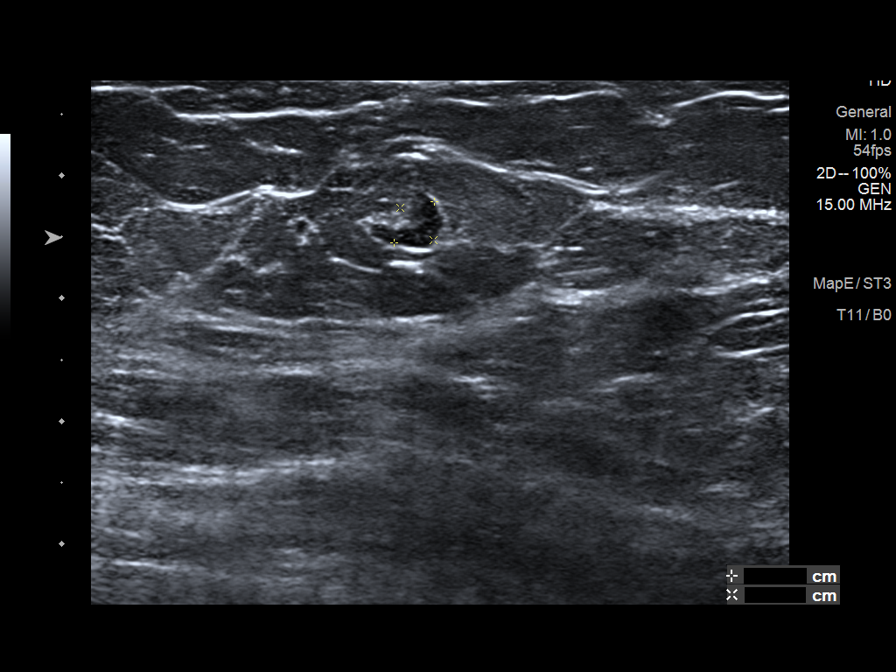
[im 7/8]
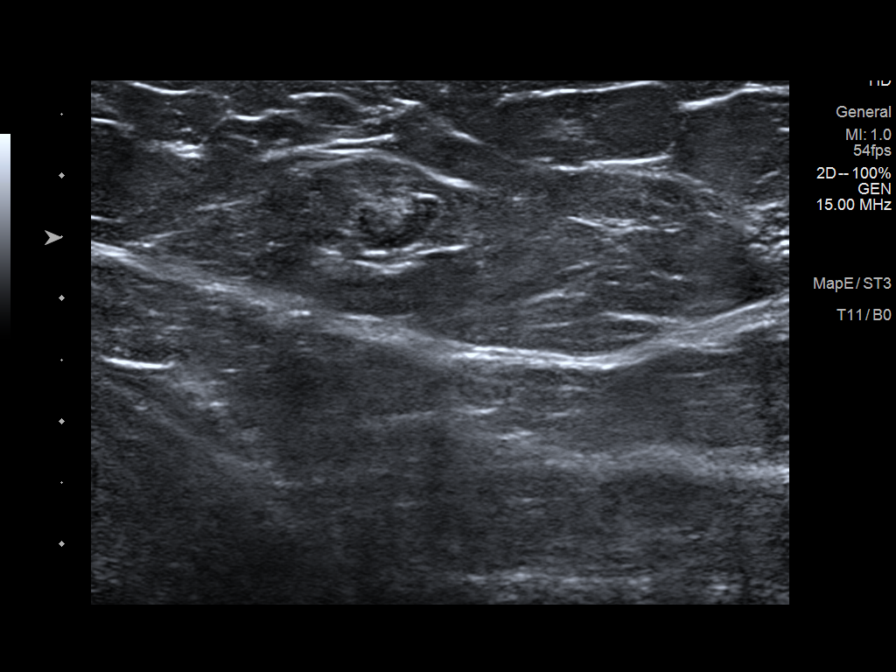
[im 8/8]
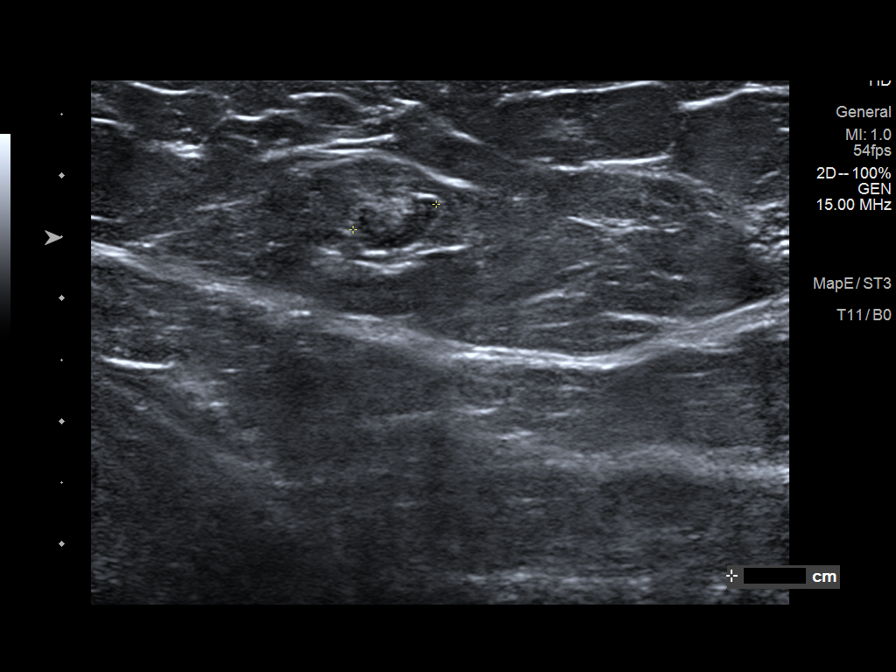

[8 of 8 positions shown; findings below may reference images not displayed]

ACR Breast Density Category b: There are scattered areas of
fibroglandular density.
FINDINGS: Mammogram:

Right breast: At the palpable site of concern in the upper outer
right breast there is an oval mass with central fat, most likely a
lymph node measuring 0.8 cm. There are no other findings in the
right breast.

Left breast: At the palpable site of concern in the upper outer left
breast there is an oval circumscribed mass measuring 0.9 cm. Also
incidentally noted in the upper outer quadrant of the left breast is
another oval circumscribed mass with suggestion of central fat
measuring 0.7 cm.

Ultrasound:

Right breast:

Targeted ultrasound is performed in the right breast at 10 o'clock 7
cm from the nipple at the palpable site of concern demonstrating a
lymph node measuring 0.5 x 0.4 x 0.7 cm. There is no cortical
thickening or change in the normal morphology. This corresponds to
the mammographic finding.

Left breast:

Targeted ultrasound is performed in the left breast at 2 o'clock 6
cm from the nipple at the palpable site of concern demonstrating a
lymph node measuring 0.5 x 0.5 x 0.9 cm. Additionally in the left
breast at 3 o'clock 6 cm from nipple there is a lymph node measuring
0.7 x 0.5 x 0.9 cm. There is no cortical thickening. Fatty hila are
retained. These correspond to the mammographic findings.
IMPRESSION: At the palpable sites of concern in the bilateral breasts there are
benign lymph nodes. No mammographic or sonographic evidence of
malignancy.

RECOMMENDATION:
Begin annual screening mammography at age 40.

I have discussed the findings and recommendations with the patient.
If applicable, a reminder letter will be sent to the patient
regarding the next appointment.

BI-RADS CATEGORY  2: Benign.

## 2020-09-17 IMAGING — US US BREAST*L* LIMITED INC AXILLA
1 series · 12 of 12 positions shown · non-contrast
Comparison: Previous exam(s).
COMPARISON: None

*** End of Addendum ***
COMPARISON: Previous exam(s).

Addendum:
CLINICAL DATA: 33-year-old female presenting with new palpable
areas of concern in the bilateral breasts.

EXAM:
DIGITAL DIAGNOSTIC BILATERAL MAMMOGRAM WITH TOMO
ULTRASOUND BILATERAL BREAST

[Series 1: us breast*left* limited inc axilla · 0.06mm/px · 12 of 12 slices shown]
[im 1/12]
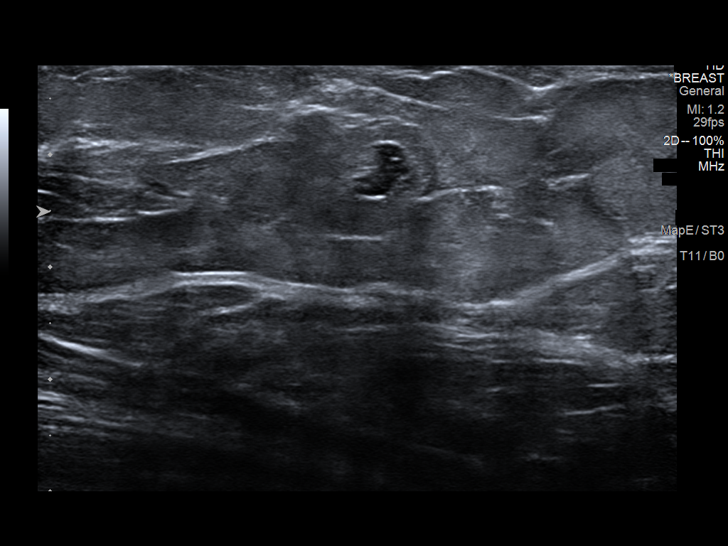
[im 2/12]
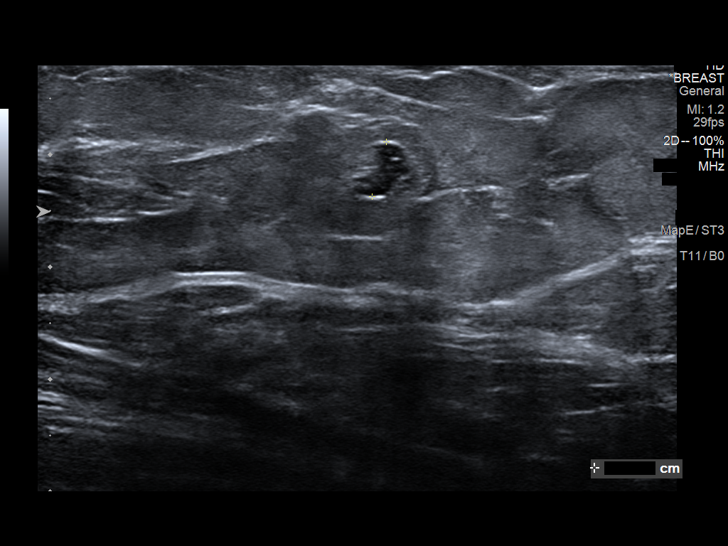
[im 3/12]
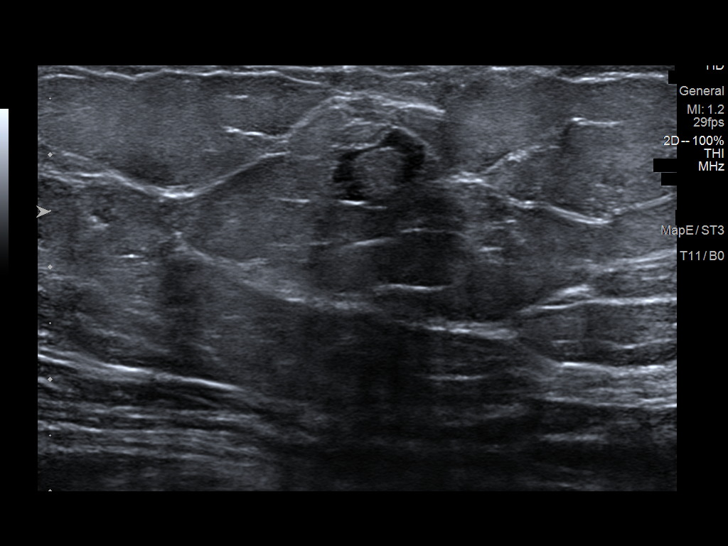
[im 4/12]
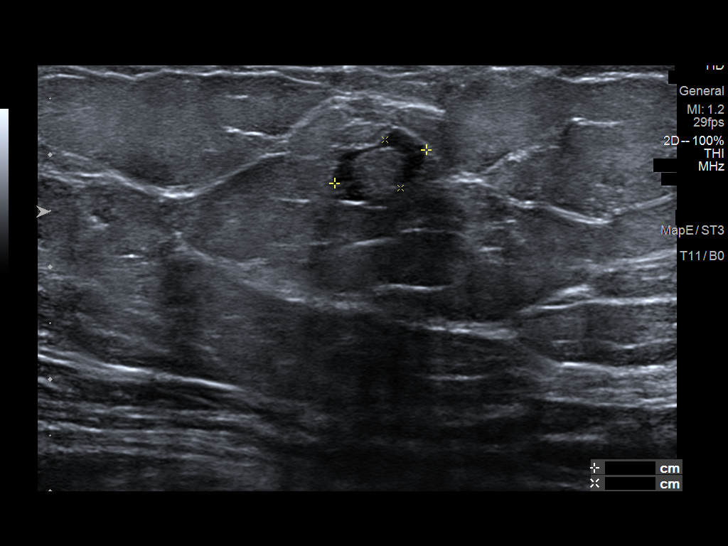
[im 5/12]
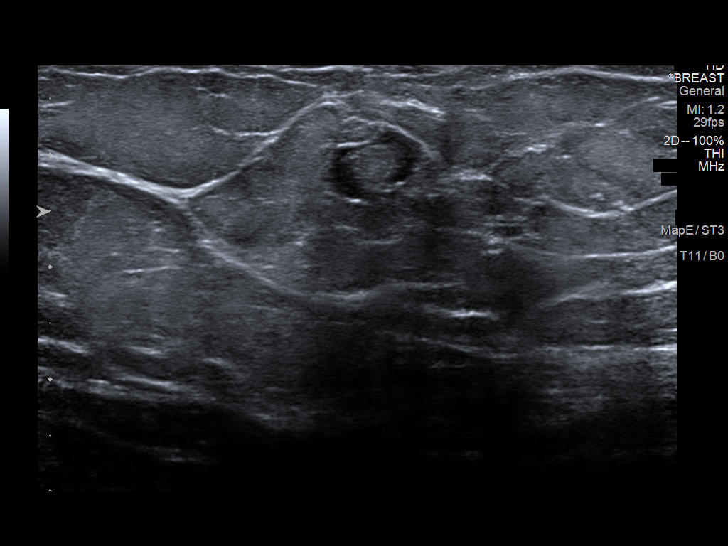
[im 6/12]
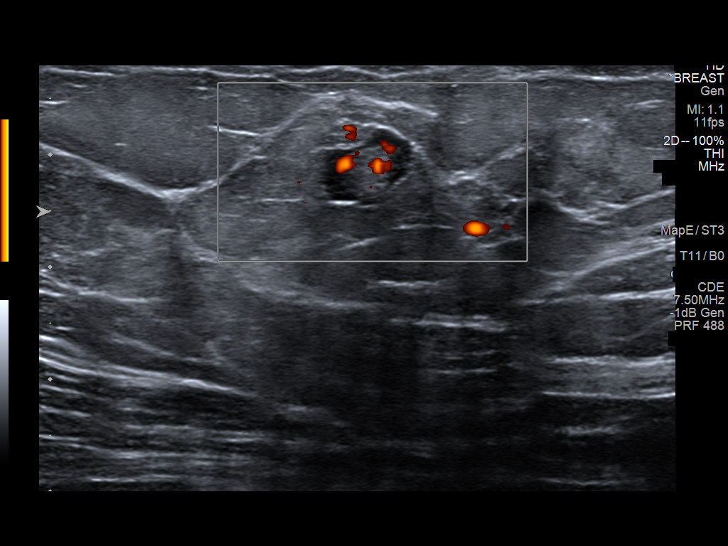
[im 7/12]
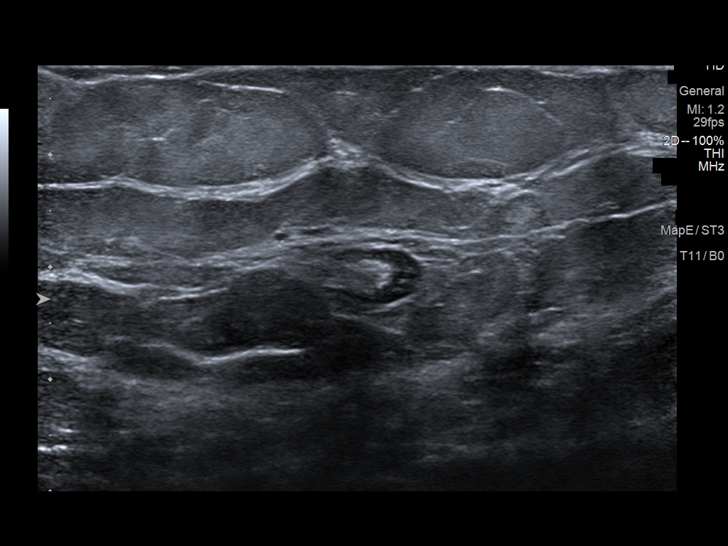
[im 8/12]
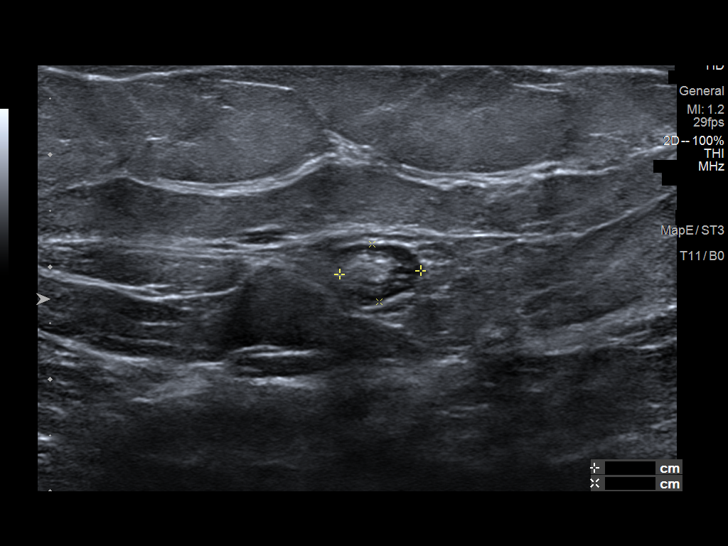
[im 9/12]
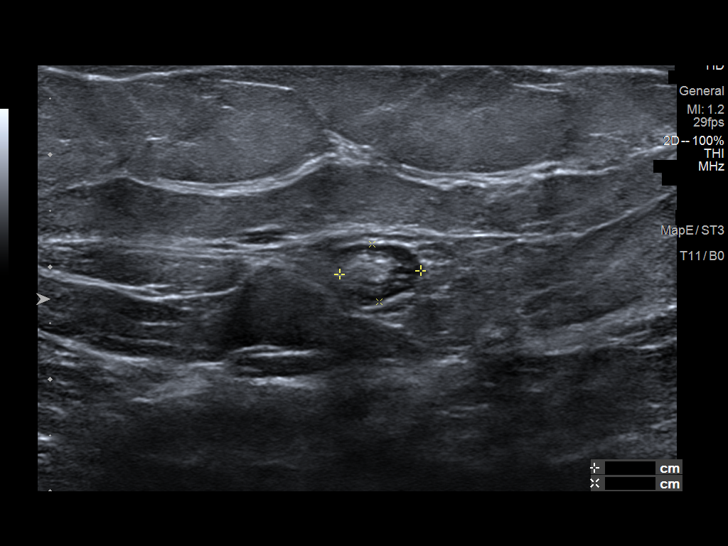
[im 10/12]
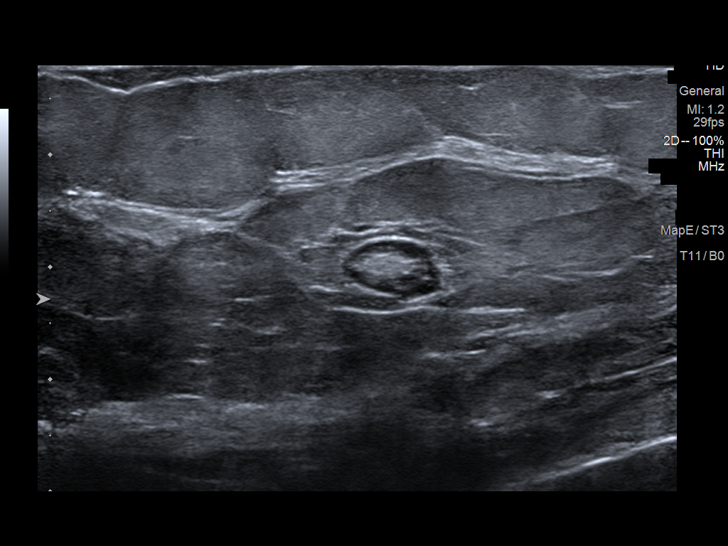
[im 11/12]
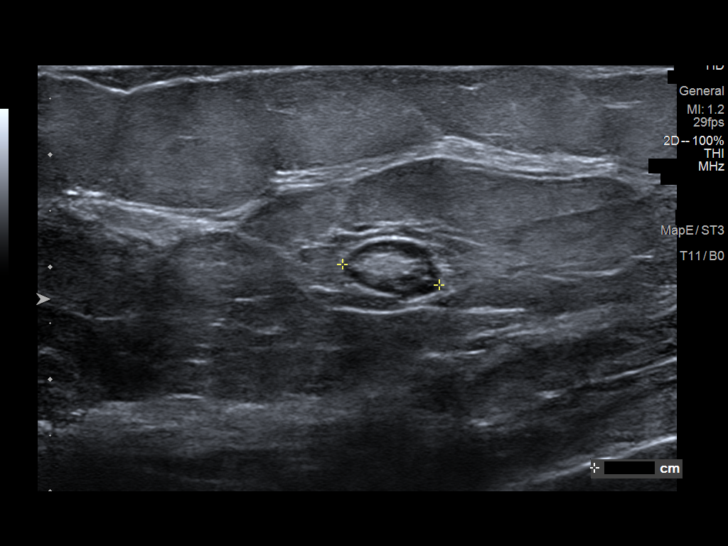
[im 12/12]
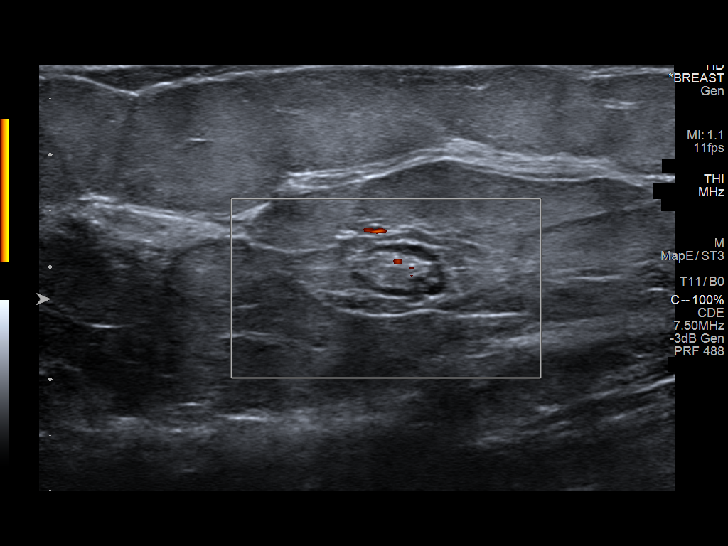

[12 of 12 positions shown; findings below may reference images not displayed]

ACR Breast Density Category b: There are scattered areas of
fibroglandular density.
FINDINGS: Mammogram:

Right breast: At the palpable site of concern in the upper outer
right breast there is an oval mass with central fat, most likely a
lymph node measuring 0.8 cm. There are no other findings in the
right breast.

Left breast: At the palpable site of concern in the upper outer left
breast there is an oval circumscribed mass measuring 0.9 cm. Also
incidentally noted in the upper outer quadrant of the left breast is
another oval circumscribed mass with suggestion of central fat
measuring 0.7 cm.

Ultrasound:

Right breast:

Targeted ultrasound is performed in the right breast at 10 o'clock 7
cm from the nipple at the palpable site of concern demonstrating a
lymph node measuring 0.5 x 0.4 x 0.7 cm. There is no cortical
thickening or change in the normal morphology. This corresponds to
the mammographic finding.

Left breast:

Targeted ultrasound is performed in the left breast at 2 o'clock 6
cm from the nipple at the palpable site of concern demonstrating a
lymph node measuring 0.5 x 0.5 x 0.9 cm. Additionally in the left
breast at 3 o'clock 6 cm from nipple there is a lymph node measuring
0.7 x 0.5 x 0.9 cm. There is no cortical thickening. Fatty hila are
retained. These correspond to the mammographic findings.
IMPRESSION: At the palpable sites of concern in the bilateral breasts there are
benign lymph nodes. No mammographic or sonographic evidence of
malignancy.

RECOMMENDATION:
Begin annual screening mammography at age 40.

I have discussed the findings and recommendations with the patient.
If applicable, a reminder letter will be sent to the patient
regarding the next appointment.

BI-RADS CATEGORY  2: Benign.

ADDENDUM:
ACR Breast Density Category b: There are scattered areas of
fibroglandular density.
FINDINGS: Mammogram:

Right breast: At the palpable site of concern in the upper outer
right breast there is an oval mass with central fat, most likely a
lymph node measuring 0.8 cm. There are no other findings in the
right breast.

Left breast: At the palpable site of concern in the upper outer left
breast there is an oval circumscribed mass measuring 0.9 cm. Also
incidentally noted in the upper outer quadrant of the left breast is
another oval circumscribed mass with suggestion of central fat
measuring 0.7 cm.

Ultrasound:

Right breast:

Targeted ultrasound is performed in the right breast at 10 o'clock 7
cm from the nipple at the palpable site of concern demonstrating a
lymph node measuring 0.5 x 0.4 x 0.7 cm. There is no cortical
thickening or change in the normal morphology. This corresponds to
the mammographic finding.

Left breast:

Targeted ultrasound is performed in the left breast at 2 o'clock 6
cm from the nipple at the palpable site of concern demonstrating a
lymph node measuring 0.5 x 0.5 x 0.9 cm. Additionally in the left
breast at 3 o'clock 6 cm from nipple there is a lymph node measuring
0.7 x 0.5 x 0.9 cm. There is no cortical thickening. Fatty hila are
retained. These correspond to the mammographic findings.
IMPRESSION: At the palpable sites of concern in the bilateral breasts there are
benign lymph nodes. No mammographic or sonographic evidence of
malignancy.

RECOMMENDATION:
Begin annual screening mammography at age 40.

I have discussed the findings and recommendations with the patient.
If applicable, a reminder letter will be sent to the patient
regarding the next appointment.

BI-RADS CATEGORY  2: Benign.

## 2020-11-18 ENCOUNTER — Telehealth (INDEPENDENT_AMBULATORY_CARE_PROVIDER_SITE_OTHER): Payer: 59 | Admitting: Family Medicine

## 2020-11-18 ENCOUNTER — Encounter: Payer: Self-pay | Admitting: Family Medicine

## 2020-11-18 ENCOUNTER — Other Ambulatory Visit (HOSPITAL_BASED_OUTPATIENT_CLINIC_OR_DEPARTMENT_OTHER): Payer: Self-pay

## 2020-11-18 DIAGNOSIS — E559 Vitamin D deficiency, unspecified: Secondary | ICD-10-CM | POA: Diagnosis not present

## 2020-11-18 DIAGNOSIS — Z0000 Encounter for general adult medical examination without abnormal findings: Secondary | ICD-10-CM

## 2020-11-18 DIAGNOSIS — K219 Gastro-esophageal reflux disease without esophagitis: Secondary | ICD-10-CM

## 2020-11-18 MED ORDER — FAMOTIDINE 40 MG PO TABS
ORAL_TABLET | Freq: Every day | ORAL | 3 refills | Status: DC
  Filled 2020-11-18: qty 90, 90d supply, fill #0

## 2020-11-18 NOTE — Assessment & Plan Note (Signed)
pepcid refilled  F/u for cpe

## 2020-11-18 NOTE — Progress Notes (Signed)
MyChart Video Visit    Virtual Visit via Video Note   This visit type was conducted due to national recommendations for restrictions regarding the COVID-19 Pandemic (e.g. social distancing) in an effort to limit this patient's exposure and mitigate transmission in our community. This patient is at least at moderate risk for complications without adequate follow up. This format is felt to be most appropriate for this patient at this time. Physical exam was limited by quality of the video and audio technology used for the visit. Shirleen Schirmer sone  was able to get the patient set up on a video visit.  Patient location: Home Patient and provider in visit Provider location: Office  I discussed the limitations of evaluation and management by telemedicine and the availability of in person appointments. The patient expressed understanding and agreed to proceed.  Visit Date: 11/18/2020  Today's healthcare provider: Ann Held, DO     Subjective:    Patient ID: Katherine Mcfarland, female    DOB: Mar 07, 1986, 35 y.o.   MRN: [id number]  No chief complaint on file.   HPI Patient is in virtual visit -- she needs a refill on her pepcid and would like to get labs done prior to her cpe next month.  No other complaints  Past Medical History:  Diagnosis Date   Allergy    pt. reports seasonal allergies   Headache    Lactose intolerance    Palpitations    Right knee pain    Tinnitus     Past Surgical History:  Procedure Laterality Date   CESAREAN SECTION     WISDOM TOOTH EXTRACTION      Family History  Problem Relation Age of Onset   Diabetes Maternal Grandmother    Hypertension Maternal Grandmother    Hypertension Mother    Hyperlipidemia Mother    Hypertension Father     Social History   Socioeconomic History   Marital status: Single    Spouse name: Not on file   Number of children: Not on file   Years of education: Not on file   Highest education level: Not on  file  Occupational History   Occupation: CMA    Employer: [employer]  Tobacco Use   Smoking status: Never   Smokeless tobacco: Never  Vaping Use   Vaping Use: Never used  Substance and Sexual Activity   Alcohol use: Yes    Comment: social   Drug use: No   Sexual activity: Yes    Birth control/protection: None  Other Topics Concern   Not on file  Social History Narrative   Not on file   Social Determinants of Health   Financial Resource Strain: Not on file  Food Insecurity: Not on file  Transportation Needs: Not on file  Physical Activity: Not on file  Stress: Not on file  Social Connections: Not on file  Intimate Partner Violence: Not on file    Outpatient Medications Prior to Visit  Medication Sig Dispense Refill   fluticasone (FLONASE) 50 MCG/ACT nasal spray PLACE 2 SPRAYS INTO BOTH NOSTRILS DAILY. 32 g 1   levocetirizine (XYZAL) 5 MG tablet TAKE 1 TABLET (5 MG TOTAL) BY MOUTH 2 (TWO) TIMES DAILY. 180 tablet 3   montelukast (SINGULAIR) 10 MG tablet TAKE 1 TABLET (10 MG TOTAL) BY MOUTH AT BEDTIME. 90 tablet 1   Prenatal 27-1 MG TABS TAKE 1 TABLET BY MOUTH DAILY. 90 tablet 1   Prenatal Multivit-Min-Fe-FA (PRENATAL/IRON) TABS Take 1  tablet by mouth daily. 90 tablet 1   Prenatal Vit-Fe Fumarate-FA (PRENATAL VITAMIN PLUS LOW IRON) 27-1 MG TABS TAKE 1 TABLET BY MOUTH DAILY. 100 tablet 1   tretinoin (RETIN-A) 0.025 % cream Apply topically at bedtime. 45 g 3   Vitamin D, Ergocalciferol, (DRISDOL) 1.25 MG (50000 UNIT) CAPS capsule TAKE 1 CAPSULE (50,000 UNITS TOTAL) BY MOUTH EVERY 7 (SEVEN) DAYS. 12 capsule 0   famotidine (PEPCID) 40 MG tablet TAKE 1 TABLET (40 MG TOTAL) BY MOUTH DAILY. 90 tablet 3   No facility-administered medications prior to visit.    Allergies  Allergen Reactions   Metronidazole Nausea And Vomiting   Mushroom Extract Complex Diarrhea    Review of Systems  Constitutional:  Negative for chills and fever.  HENT:  Negative for congestion, ear  discharge and nosebleeds.   Eyes:  Negative for pain and redness.  Respiratory:  Negative for cough.   Cardiovascular:  Negative for chest pain.  Gastrointestinal:  Negative for abdominal pain, diarrhea, nausea and vomiting.  Musculoskeletal:  Negative for back pain and neck pain.  Skin:  Negative for itching and rash.  Psychiatric/Behavioral:  Negative for substance abuse and suicidal ideas. The patient is not nervous/anxious.       Objective:    Physical Exam Vitals and nursing note reviewed.  Constitutional:      General: She is not in acute distress.    Appearance: Normal appearance. She is not ill-appearing.  Neurological:     Mental Status: She is alert.  Psychiatric:        Mood and Affect: Mood normal.        Behavior: Behavior normal.        Thought Content: Thought content normal.        Judgment: Judgment normal.    There were no vitals taken for this visit. Wt Readings from Last 3 Encounters:  06/20/20 202 lb (91.6 kg)  10/28/19 215 lb (97.5 kg)  07/28/19 227 lb 6.4 oz (103.1 kg)    Diabetic Foot Exam - Simple   No data filed    Lab Results  Component Value Date   WBC 6.6 02/26/2019   HGB 10.7 (L) 02/26/2019   HCT 33.0 (L) 02/26/2019   PLT 362.0 02/26/2019   GLUCOSE 113 (H) 10/29/2019   CHOL 148 10/29/2019   TRIG 93.0 10/29/2019   HDL 35.40 (L) 10/29/2019   LDLCALC 94 10/29/2019   ALT 16 10/29/2019   AST 19 10/29/2019   NA 137 10/29/2019   K 3.8 10/29/2019   CL 105 10/29/2019   CREATININE 0.76 10/29/2019   BUN 9 10/29/2019   CO2 26 10/29/2019   TSH 1.24 10/29/2019   HGBA1C 5.8 10/29/2019    Lab Results  Component Value Date   TSH 1.24 10/29/2019   Lab Results  Component Value Date   WBC 6.6 02/26/2019   HGB 10.7 (L) 02/26/2019   HCT 33.0 (L) 02/26/2019   MCV 83.8 02/26/2019   PLT 362.0 02/26/2019   Lab Results  Component Value Date   NA 137 10/29/2019   K 3.8 10/29/2019   CO2 26 10/29/2019   GLUCOSE 113 (H) 10/29/2019   BUN 9  10/29/2019   CREATININE 0.76 10/29/2019   BILITOT 0.3 10/29/2019   ALKPHOS 62 10/29/2019   AST 19 10/29/2019   ALT 16 10/29/2019   PROT 6.8 10/29/2019   ALBUMIN 4.1 10/29/2019   CALCIUM 9.1 10/29/2019   GFR 104.01 10/29/2019   Lab Results  Component Value  Date   CHOL 148 10/29/2019   Lab Results  Component Value Date   HDL 35.40 (L) 10/29/2019   Lab Results  Component Value Date   LDLCALC 94 10/29/2019   Lab Results  Component Value Date   TRIG 93.0 10/29/2019   Lab Results  Component Value Date   CHOLHDL 4 10/29/2019   Lab Results  Component Value Date   HGBA1C 5.8 10/29/2019       Assessment & Plan:   Problem List Items Addressed This Visit       Unprioritized   Vitamin D deficiency   Relevant Orders   Vitamin D (25 hydroxy)   Gastroesophageal reflux disease without esophagitis - Primary    pepcid refilled  F/u for cpe       Relevant Medications   famotidine (PEPCID) 40 MG tablet   Other Visit Diagnoses     Preventative health care       Relevant Orders   Lipid panel   CBC with Differential/Platelet   TSH   Comprehensive metabolic panel      Orders placed for cpe labs  F/u next month  Meds ordered this encounter  Medications   famotidine (PEPCID) 40 MG tablet    Sig: TAKE 1 TABLET (40 MG TOTAL) BY MOUTH DAILY.    Dispense:  90 tablet    Refill:  3    I discussed the assessment and treatment plan with the patient. The patient was provided an opportunity to ask questions and all were answered. The patient agreed with the plan and demonstrated an understanding of the instructions.   The patient was advised to call back or seek an in-person evaluation if the symptoms worsen or if the condition fails to improve as anticipated.    Ann Held, DO [place] [facility] [phone] (phone) [phone] (fax)  [place]

## 2020-11-21 ENCOUNTER — Telehealth: Payer: Self-pay

## 2020-11-21 ENCOUNTER — Encounter: Payer: Self-pay | Admitting: Family Medicine

## 2020-11-21 ENCOUNTER — Other Ambulatory Visit (INDEPENDENT_AMBULATORY_CARE_PROVIDER_SITE_OTHER): Payer: 59

## 2020-11-21 ENCOUNTER — Other Ambulatory Visit: Payer: Self-pay

## 2020-11-21 ENCOUNTER — Other Ambulatory Visit (HOSPITAL_BASED_OUTPATIENT_CLINIC_OR_DEPARTMENT_OTHER): Payer: Self-pay

## 2020-11-21 DIAGNOSIS — E1165 Type 2 diabetes mellitus with hyperglycemia: Secondary | ICD-10-CM

## 2020-11-21 DIAGNOSIS — E559 Vitamin D deficiency, unspecified: Secondary | ICD-10-CM

## 2020-11-21 DIAGNOSIS — Z0000 Encounter for general adult medical examination without abnormal findings: Secondary | ICD-10-CM | POA: Diagnosis not present

## 2020-11-21 DIAGNOSIS — J302 Other seasonal allergic rhinitis: Secondary | ICD-10-CM | POA: Diagnosis not present

## 2020-11-21 LAB — CBC WITH DIFFERENTIAL/PLATELET
Basophils Absolute: 0 10*3/uL (ref 0.0–0.1)
Basophils Relative: 0.8 % (ref 0.0–3.0)
Eosinophils Absolute: 0.2 10*3/uL (ref 0.0–0.7)
Eosinophils Relative: 3.8 % (ref 0.0–5.0)
HCT: 33.6 % — ABNORMAL LOW (ref 36.0–46.0)
Hemoglobin: 11 g/dL — ABNORMAL LOW (ref 12.0–15.0)
Lymphocytes Relative: 45.2 % (ref 12.0–46.0)
Lymphs Abs: 2.6 10*3/uL (ref 0.7–4.0)
MCHC: 32.6 g/dL (ref 30.0–36.0)
MCV: 82.3 fl (ref 78.0–100.0)
Monocytes Absolute: 0.4 10*3/uL (ref 0.1–1.0)
Monocytes Relative: 7.5 % (ref 3.0–12.0)
Neutro Abs: 2.4 10*3/uL (ref 1.4–7.7)
Neutrophils Relative %: 42.7 % — ABNORMAL LOW (ref 43.0–77.0)
Platelets: 347 10*3/uL (ref 150.0–400.0)
RBC: 4.08 Mil/uL (ref 3.87–5.11)
RDW: 15.5 % (ref 11.5–15.5)
WBC: 5.7 10*3/uL (ref 4.0–10.5)

## 2020-11-21 LAB — COMPREHENSIVE METABOLIC PANEL
ALT: 13 U/L (ref 0–35)
AST: 14 U/L (ref 0–37)
Albumin: 4.1 g/dL (ref 3.5–5.2)
Alkaline Phosphatase: 60 U/L (ref 39–117)
BUN: 12 mg/dL (ref 6–23)
CO2: 27 mEq/L (ref 19–32)
Calcium: 9 mg/dL (ref 8.4–10.5)
Chloride: 103 mEq/L (ref 96–112)
Creatinine, Ser: 0.79 mg/dL (ref 0.40–1.20)
GFR: 95.59 mL/min (ref >60.00)
Glucose, Bld: 85 mg/dL (ref 70–99)
Potassium: 3.8 mEq/L (ref 3.5–5.1)
Sodium: 137 mEq/L (ref 135–145)
Total Bilirubin: 0.3 mg/dL (ref 0.2–1.2)
Total Protein: 6.9 g/dL (ref 6.0–8.3)

## 2020-11-21 LAB — TSH: TSH: 1.15 u[IU]/mL (ref 0.35–5.50)

## 2020-11-21 LAB — LIPID PANEL
Cholesterol: 194 mg/dL (ref 0–200)
HDL: 50.2 mg/dL (ref >39.00)
LDL Cholesterol: 109 mg/dL — ABNORMAL HIGH (ref 0–99)
NonHDL: 143.61
Total CHOL/HDL Ratio: 4
Triglycerides: 173 mg/dL — ABNORMAL HIGH (ref 0.0–149.0)
VLDL: 34.6 mg/dL (ref 0.0–40.0)

## 2020-11-21 LAB — HEMOGLOBIN A1C: Hgb A1c MFr Bld: 5.9 % (ref 4.6–6.5)

## 2020-11-21 LAB — VITAMIN D 25 HYDROXY (VIT D DEFICIENCY, FRACTURES): VITD: 21.67 ng/mL — ABNORMAL LOW (ref 30.00–100.00)

## 2020-11-21 MED ORDER — FLUCONAZOLE 150 MG PO TABS
150.0000 mg | ORAL_TABLET | Freq: Once | ORAL | 0 refills | Status: AC
  Filled 2020-11-21: qty 12, 90d supply, fill #0
  Filled 2021-01-13: qty 12, 12d supply, fill #0

## 2020-11-21 NOTE — Telephone Encounter (Signed)
Pt requesting a years supply of Diflucan. Pt forgot to ask Lowne during last weeks visit. Pt states Lowne knows pt gets yeast infections after every period. Please advise okay to refill   Please send pharmacy downstairs.

## 2020-11-22 ENCOUNTER — Other Ambulatory Visit (HOSPITAL_BASED_OUTPATIENT_CLINIC_OR_DEPARTMENT_OTHER): Payer: Self-pay

## 2020-11-22 MED ORDER — VITAMIN D (ERGOCALCIFEROL) 1.25 MG (50000 UNIT) PO CAPS
ORAL_CAPSULE | ORAL | 2 refills | Status: DC
  Filled 2020-11-22: qty 12, 84d supply, fill #0

## 2020-11-22 NOTE — Addendum Note (Signed)
Addended by: Sanda Linger on: 11/22/2020 01:53 PM   Modules accepted: Orders

## 2020-11-29 ENCOUNTER — Other Ambulatory Visit (HOSPITAL_BASED_OUTPATIENT_CLINIC_OR_DEPARTMENT_OTHER): Payer: Self-pay

## 2020-12-26 ENCOUNTER — Other Ambulatory Visit: Payer: Self-pay

## 2020-12-27 ENCOUNTER — Encounter: Payer: Self-pay | Admitting: Family Medicine

## 2020-12-27 NOTE — Progress Notes (Incomplete)
Subjective:   By signing my name below, I, Shehryar Baig, attest that this documentation has been prepared under the direction and in the presence of Dr. Roma Schanz, DO. 12/27/2020    Patient ID: Katherine Mcfarland, female    DOB: November 15, 1985, 35 y.o.   MRN: [id number]  No chief complaint on file.   HPI Patient is in today for a comprehensive physical exam.   She denies having any fever, ear pain, congestion, sinus pain, sore throat, eye pain, chest pain, palpations, cough, SOB, wheezing, n/v/d, constipation, blood in stool, dysuria, frequency, hematuria, or headaches at this time.   Past Medical History:  Diagnosis Date   Allergy    pt. reports seasonal allergies   Headache    Lactose intolerance    Palpitations    Right knee pain    Tinnitus     Past Surgical History:  Procedure Laterality Date   CESAREAN SECTION     WISDOM TOOTH EXTRACTION      Family History  Problem Relation Age of Onset   Diabetes Maternal Grandmother    Hypertension Maternal Grandmother    Hypertension Mother    Hyperlipidemia Mother    Hypertension Father     Social History   Socioeconomic History   Marital status: Single    Spouse name: Not on file   Number of children: Not on file   Years of education: Not on file   Highest education level: Not on file  Occupational History   Occupation: CMA    Employer: [employer]  Tobacco Use   Smoking status: Never   Smokeless tobacco: Never  Vaping Use   Vaping Use: Never used  Substance and Sexual Activity   Alcohol use: Yes    Comment: social   Drug use: No   Sexual activity: Yes    Birth control/protection: None  Other Topics Concern   Not on file  Social History Narrative   Not on file   Social Determinants of Health   Financial Resource Strain: Not on file  Food Insecurity: Not on file  Transportation Needs: Not on file  Physical Activity: Not on file  Stress: Not on file  Social Connections: Not on file   Intimate Partner Violence: Not on file    Outpatient Medications Prior to Visit  Medication Sig Dispense Refill   famotidine (PEPCID) 40 MG tablet TAKE 1 TABLET (40 MG TOTAL) BY MOUTH DAILY. 90 tablet 3   fluticasone (FLONASE) 50 MCG/ACT nasal spray PLACE 2 SPRAYS INTO BOTH NOSTRILS DAILY. 32 g 1   levocetirizine (XYZAL) 5 MG tablet TAKE 1 TABLET (5 MG TOTAL) BY MOUTH 2 (TWO) TIMES DAILY. 180 tablet 3   montelukast (SINGULAIR) 10 MG tablet TAKE 1 TABLET (10 MG TOTAL) BY MOUTH AT BEDTIME. 90 tablet 1   Prenatal 27-1 MG TABS TAKE 1 TABLET BY MOUTH DAILY. 90 tablet 1   Prenatal Multivit-Min-Fe-FA (PRENATAL/IRON) TABS Take 1 tablet by mouth daily. 90 tablet 1   Prenatal Vit-Fe Fumarate-FA (PRENATAL VITAMIN PLUS LOW IRON) 27-1 MG TABS TAKE 1 TABLET BY MOUTH DAILY. 100 tablet 1   tretinoin (RETIN-A) 0.025 % cream Apply topically at bedtime. 45 g 3   Vitamin D, Ergocalciferol, (DRISDOL) 1.25 MG (50000 UNIT) CAPS capsule TAKE 1 CAPSULE (50,000 UNITS TOTAL) BY MOUTH EVERY 7 (SEVEN) DAYS. 12 capsule 2   No facility-administered medications prior to visit.    Allergies  Allergen Reactions   Metronidazole Nausea And Vomiting   Mushroom Extract Complex Diarrhea  Review of Systems  Constitutional:  Negative for fever.  HENT:  Negative for congestion, ear pain, sinus pain and sore throat.   Eyes:  Negative for pain.  Respiratory:  Negative for cough, shortness of breath and wheezing.   Cardiovascular:  Negative for chest pain and palpitations.  Gastrointestinal:  Negative for blood in stool, constipation, diarrhea, nausea and vomiting.  Genitourinary:  Negative for dysuria, frequency and hematuria.  Neurological:  Negative for headaches.  Psychiatric/Behavioral:  Negative for depression. The patient is not nervous/anxious.       Objective:    Physical Exam Constitutional:      General: She is not in acute distress.    Appearance: Normal appearance. She is not ill-appearing.  HENT:      Head: Normocephalic and atraumatic.     Right Ear: Tympanic membrane, ear canal and external ear normal.     Left Ear: Tympanic membrane, ear canal and external ear normal.  Eyes:     Extraocular Movements: Extraocular movements intact.     Pupils: Pupils are equal, round, and reactive to light.  Cardiovascular:     Rate and Rhythm: Normal rate and regular rhythm.     Heart sounds: Normal heart sounds. No murmur heard.   No gallop.  Pulmonary:     Effort: Pulmonary effort is normal. No respiratory distress.     Breath sounds: Normal breath sounds. No wheezing or rales.  Abdominal:     General: Bowel sounds are normal. There is no distension.     Palpations: Abdomen is soft. There is no mass.     Tenderness: There is no abdominal tenderness. There is no guarding or rebound.  Skin:    General: Skin is warm and dry.  Neurological:     Mental Status: She is alert and oriented to person, place, and time.  Psychiatric:        Behavior: Behavior normal.    There were no vitals taken for this visit. Wt Readings from Last 3 Encounters:  06/20/20 202 lb (91.6 kg)  10/28/19 215 lb (97.5 kg)  07/28/19 227 lb 6.4 oz (103.1 kg)    Diabetic Foot Exam - Simple   No data filed    Lab Results  Component Value Date   WBC 5.7 11/21/2020   HGB 11.0 (L) 11/21/2020   HCT 33.6 (L) 11/21/2020   PLT 347.0 11/21/2020   GLUCOSE 85 11/21/2020   CHOL 194 11/21/2020   TRIG 173.0 (H) 11/21/2020   HDL 50.20 11/21/2020   LDLCALC 109 (H) 11/21/2020   ALT 13 11/21/2020   AST 14 11/21/2020   NA 137 11/21/2020   K 3.8 11/21/2020   CL 103 11/21/2020   CREATININE 0.79 11/21/2020   BUN 12 11/21/2020   CO2 27 11/21/2020   TSH 1.15 11/21/2020   HGBA1C 5.9 11/21/2020    Lab Results  Component Value Date   TSH 1.15 11/21/2020   Lab Results  Component Value Date   WBC 5.7 11/21/2020   HGB 11.0 (L) 11/21/2020   HCT 33.6 (L) 11/21/2020   MCV 82.3 11/21/2020   PLT 347.0 11/21/2020   Lab Results   Component Value Date   NA 137 11/21/2020   K 3.8 11/21/2020   CO2 27 11/21/2020   GLUCOSE 85 11/21/2020   BUN 12 11/21/2020   CREATININE 0.79 11/21/2020   BILITOT 0.3 11/21/2020   ALKPHOS 60 11/21/2020   AST 14 11/21/2020   ALT 13 11/21/2020   PROT 6.9 11/21/2020  ALBUMIN 4.1 11/21/2020   CALCIUM 9.0 11/21/2020   GFR 95.59 11/21/2020   Lab Results  Component Value Date   CHOL 194 11/21/2020   Lab Results  Component Value Date   HDL 50.20 11/21/2020   Lab Results  Component Value Date   LDLCALC 109 (H) 11/21/2020   Lab Results  Component Value Date   TRIG 173.0 (H) 11/21/2020   Lab Results  Component Value Date   CHOLHDL 4 11/21/2020   Lab Results  Component Value Date   HGBA1C 5.9 11/21/2020   Pap Smear- Last completed 10/23/2017. Results normal. Repeat in 3 years.       Assessment & Plan:   Problem List Items Addressed This Visit   None    No orders of the defined types were placed in this encounter.   I, Dr. Roma Schanz, DO, personally preformed the services described in this documentation.  All medical record entries made by the scribe were at my direction and in my presence.  I have reviewed the chart and discharge instructions (if applicable) and agree that the record reflects my personal performance and is accurate and complete. 12/27/2020   I,Shehryar Baig,acting as a scribe for Ann Held, DO.,have documented all relevant documentation on the behalf of Ann Held, DO,as directed by  Ann Held, DO while in the presence of Ann Held, DO.   Shehryar [employer]

## 2021-01-13 ENCOUNTER — Other Ambulatory Visit (HOSPITAL_BASED_OUTPATIENT_CLINIC_OR_DEPARTMENT_OTHER): Payer: Self-pay

## 2021-01-24 ENCOUNTER — Other Ambulatory Visit: Payer: Self-pay

## 2021-01-24 ENCOUNTER — Ambulatory Visit (INDEPENDENT_AMBULATORY_CARE_PROVIDER_SITE_OTHER): Payer: 59 | Admitting: Family Medicine

## 2021-01-24 ENCOUNTER — Encounter: Payer: Self-pay | Admitting: Family Medicine

## 2021-01-24 VITALS — BP 117/60 | HR 88 | Temp 98.3°F | Resp 12 | Ht 66.0 in | Wt 217.8 lb

## 2021-01-24 DIAGNOSIS — Z0000 Encounter for general adult medical examination without abnormal findings: Secondary | ICD-10-CM

## 2021-01-24 DIAGNOSIS — J302 Other seasonal allergic rhinitis: Secondary | ICD-10-CM | POA: Diagnosis not present

## 2021-01-24 DIAGNOSIS — E1165 Type 2 diabetes mellitus with hyperglycemia: Secondary | ICD-10-CM

## 2021-01-24 DIAGNOSIS — E559 Vitamin D deficiency, unspecified: Secondary | ICD-10-CM

## 2021-01-24 DIAGNOSIS — Z1159 Encounter for screening for other viral diseases: Secondary | ICD-10-CM | POA: Diagnosis not present

## 2021-01-24 DIAGNOSIS — K219 Gastro-esophageal reflux disease without esophagitis: Secondary | ICD-10-CM | POA: Diagnosis not present

## 2021-01-24 DIAGNOSIS — E7849 Other hyperlipidemia: Secondary | ICD-10-CM

## 2021-01-24 DIAGNOSIS — L700 Acne vulgaris: Secondary | ICD-10-CM

## 2021-01-24 MED ORDER — MONTELUKAST SODIUM 10 MG PO TABS
ORAL_TABLET | Freq: Every day | ORAL | 1 refills | Status: DC
Start: 2021-01-24 — End: 2021-07-21
  Filled 2021-01-24: qty 90, 90d supply, fill #0
  Filled 2021-04-11: qty 90, 90d supply, fill #1

## 2021-01-24 MED ORDER — LEVOCETIRIZINE DIHYDROCHLORIDE 5 MG PO TABS
ORAL_TABLET | Freq: Two times a day (BID) | ORAL | 3 refills | Status: DC
  Filled 2021-01-24: qty 180, 90d supply, fill #0
  Filled 2021-04-11: qty 180, 90d supply, fill #1
  Filled 2021-07-21: qty 180, 90d supply, fill #2

## 2021-01-24 MED ORDER — FAMOTIDINE 40 MG PO TABS
ORAL_TABLET | Freq: Every day | ORAL | 3 refills | Status: DC
  Filled 2021-01-24: qty 90, 90d supply, fill #0
  Filled 2021-04-11: qty 90, 90d supply, fill #1
  Filled 2021-07-14: qty 90, 90d supply, fill #2
  Filled 2021-10-20 (×2): qty 90, 90d supply, fill #3

## 2021-01-24 MED ORDER — TRETINOIN 0.025 % EX CREA
TOPICAL_CREAM | Freq: Every day | CUTANEOUS | 3 refills | Status: DC
  Filled 2021-01-24: qty 45, 30d supply, fill #0

## 2021-01-24 MED ORDER — PRENATAL/IRON PO TABS
1.0000 | ORAL_TABLET | Freq: Every day | ORAL | 1 refills | Status: DC
  Filled 2021-01-24: qty 90, 90d supply, fill #0
  Filled 2021-04-11: qty 90, 90d supply, fill #1

## 2021-01-24 MED ORDER — VITAMIN D (ERGOCALCIFEROL) 1.25 MG (50000 UNIT) PO CAPS
ORAL_CAPSULE | ORAL | 2 refills | Status: DC
  Filled 2021-01-24: qty 12, 84d supply, fill #0
  Filled 2021-04-11: qty 12, 84d supply, fill #1
  Filled 2021-07-17: qty 12, 84d supply, fill #2

## 2021-01-24 NOTE — Progress Notes (Signed)
Subjective:   By signing my name below, I, Shehryar Baig, attest that this documentation has been prepared under the direction and in the presence of Ann Held, DO  01/24/2021    Patient ID: Katherine Mcfarland, female    DOB: [birth year], 35 y.o.   MRN: [id number]  Chief Complaint  Patient presents with   Annual Exam    HPI Patient is in today for a office visit. She is requesting a refill on 40 mg Pepcid daily PO, 5 mg xyzal daily PO, prenatal vitamin supplements with iron, 0.025% Retin-A cream, 10 mg Singulair daily PO, vitamin D supplements.  She is reports losing weight since starting her diet. She weighs 215 lb's at this time. Her goal is to weight less than 200 lb's.  She denies having any fever, ear pain, muscle pain, joint pain, new moles, congestion, sinus pain, sore throat, eye pain, chest pain, palpations, cough, SOB, wheezing, n/v/d, constipation, blood in stool, dysuria, frequency, hematuria, or headaches at this time. She does not have the Covid-19 vaccines. She is not interested in getting the flu vaccine at this time.    Past Medical History:  Diagnosis Date   Allergy    pt. reports seasonal allergies   Headache    Lactose intolerance    Palpitations    Right knee pain    Tinnitus     Past Surgical History:  Procedure Laterality Date   CESAREAN SECTION     WISDOM TOOTH EXTRACTION      Family History  Problem Relation Age of Onset   Diabetes Maternal Grandmother    Hypertension Maternal Grandmother    Hypertension Mother    Hyperlipidemia Mother    Hypertension Father     Social History   Socioeconomic History   Marital status: Single    Spouse name: Not on file   Number of children: Not on file   Years of education: Not on file   Highest education level: Not on file  Occupational History   Occupation: CMA    Employer: [employer]  Tobacco Use   Smoking status: Never   Smokeless tobacco: Never  Vaping Use   Vaping Use: Never used   Substance and Sexual Activity   Alcohol use: Yes    Comment: social   Drug use: No   Sexual activity: Yes    Birth control/protection: None  Other Topics Concern   Not on file  Social History Narrative   Not on file   Social Determinants of Health   Financial Resource Strain: Not on file  Food Insecurity: Not on file  Transportation Needs: Not on file  Physical Activity: Not on file  Stress: Not on file  Social Connections: Not on file  Intimate Partner Violence: Not on file    Outpatient Medications Prior to Visit  Medication Sig Dispense Refill   fluconazole (DIFLUCAN) 150 MG tablet Take 1 tablet (150 mg total) by mouth once for 1 dose. Repeat in one week if needed.  Use as needed. 12 tablet 0   fluticasone (FLONASE) 50 MCG/ACT nasal spray PLACE 2 SPRAYS INTO BOTH NOSTRILS DAILY. 32 g 1   Prenatal 27-1 MG TABS TAKE 1 TABLET BY MOUTH DAILY. 90 tablet 1   Prenatal Vit-Fe Fumarate-FA (PRENATAL VITAMIN PLUS LOW IRON) 27-1 MG TABS TAKE 1 TABLET BY MOUTH DAILY. 100 tablet 1   famotidine (PEPCID) 40 MG tablet TAKE 1 TABLET (40 MG TOTAL) BY MOUTH DAILY. 90 tablet 3   levocetirizine (  XYZAL) 5 MG tablet TAKE 1 TABLET (5 MG TOTAL) BY MOUTH 2 (TWO) TIMES DAILY. 180 tablet 3   montelukast (SINGULAIR) 10 MG tablet TAKE 1 TABLET (10 MG TOTAL) BY MOUTH AT BEDTIME. 90 tablet 1   Prenatal Multivit-Min-Fe-FA (PRENATAL/IRON) TABS Take 1 tablet by mouth daily. 90 tablet 1   tretinoin (RETIN-A) 0.025 % cream Apply topically at bedtime. 45 g 3   Vitamin D, Ergocalciferol, (DRISDOL) 1.25 MG (50000 UNIT) CAPS capsule TAKE 1 CAPSULE (50,000 UNITS TOTAL) BY MOUTH EVERY 7 (SEVEN) DAYS. 12 capsule 2   No facility-administered medications prior to visit.    Allergies  Allergen Reactions   Metronidazole Nausea And Vomiting   Mushroom Extract Complex Diarrhea    Review of Systems  Constitutional:  Negative for fever.  HENT:  Negative for congestion, ear pain, sinus pain and sore throat.   Eyes:   Negative for pain.  Respiratory:  Negative for cough, shortness of breath and wheezing.   Cardiovascular:  Negative for chest pain and palpitations.  Gastrointestinal:  Negative for blood in stool, constipation, diarrhea, nausea and vomiting.  Genitourinary:  Negative for dysuria, frequency and hematuria.  Musculoskeletal:  Negative for joint pain and myalgias.  Skin:        (-)New moles  Neurological:  Negative for headaches.      Objective:    Physical Exam Constitutional:      General: She is not in acute distress.    Appearance: Normal appearance. She is not ill-appearing.  HENT:     Head: Normocephalic and atraumatic.     Right Ear: External ear normal.     Left Ear: External ear normal.  Eyes:     Extraocular Movements: Extraocular movements intact.     Pupils: Pupils are equal, round, and reactive to light.  Cardiovascular:     Rate and Rhythm: Normal rate and regular rhythm.     Heart sounds: Normal heart sounds. No murmur heard.   No gallop.  Pulmonary:     Effort: Pulmonary effort is normal. No respiratory distress.     Breath sounds: Normal breath sounds. No wheezing or rales.  Skin:    General: Skin is warm and dry.  Neurological:     Mental Status: She is alert and oriented to person, place, and time.  Psychiatric:        Behavior: Behavior normal.        Judgment: Judgment normal.    BP 117/60 (BP Location: Right Arm, Cuff Size: Normal)   Pulse 88   Temp 98.3 F (36.8 C) (Oral)   Resp 12   Ht '5\' 6"'$  (1.676 m)   Wt 217 lb 12.8 oz (98.8 kg)   LMP 01/24/2021   SpO2 100%   BMI 35.15 kg/m  Wt Readings from Last 3 Encounters:  01/24/21 217 lb 12.8 oz (98.8 kg)  06/20/20 202 lb (91.6 kg)  10/28/19 215 lb (97.5 kg)    Diabetic Foot Exam - Simple   No data filed    Lab Results  Component Value Date   WBC 5.7 11/21/2020   HGB 11.0 (L) 11/21/2020   HCT 33.6 (L) 11/21/2020   PLT 347.0 11/21/2020   GLUCOSE 85 11/21/2020   CHOL 194 11/21/2020   TRIG  173.0 (H) 11/21/2020   HDL 50.20 11/21/2020   LDLCALC 109 (H) 11/21/2020   ALT 13 11/21/2020   AST 14 11/21/2020   NA 137 11/21/2020   K 3.8 11/21/2020   CL 103 11/21/2020  CREATININE 0.79 11/21/2020   BUN 12 11/21/2020   CO2 27 11/21/2020   TSH 1.15 11/21/2020   HGBA1C 5.9 11/21/2020    Lab Results  Component Value Date   TSH 1.15 11/21/2020   Lab Results  Component Value Date   WBC 5.7 11/21/2020   HGB 11.0 (L) 11/21/2020   HCT 33.6 (L) 11/21/2020   MCV 82.3 11/21/2020   PLT 347.0 11/21/2020   Lab Results  Component Value Date   NA 137 11/21/2020   K 3.8 11/21/2020   CO2 27 11/21/2020   GLUCOSE 85 11/21/2020   BUN 12 11/21/2020   CREATININE 0.79 11/21/2020   BILITOT 0.3 11/21/2020   ALKPHOS 60 11/21/2020   AST 14 11/21/2020   ALT 13 11/21/2020   PROT 6.9 11/21/2020   ALBUMIN 4.1 11/21/2020   CALCIUM 9.0 11/21/2020   GFR 95.59 11/21/2020   Lab Results  Component Value Date   CHOL 194 11/21/2020   Lab Results  Component Value Date   HDL 50.20 11/21/2020   Lab Results  Component Value Date   LDLCALC 109 (H) 11/21/2020   Lab Results  Component Value Date   TRIG 173.0 (H) 11/21/2020   Lab Results  Component Value Date   CHOLHDL 4 11/21/2020   Lab Results  Component Value Date   HGBA1C 5.9 11/21/2020   Mammogram- Last completed 05/20/2019. Results are normal. Repeat after age 45.  Pap Smear- Last completed 10/23/2017. Results are normal. Repeat in one year. Due.      Assessment & Plan:   Problem List Items Addressed This Visit       Unprioritized   Vitamin D deficiency   Relevant Medications   Vitamin D, Ergocalciferol, (DRISDOL) 1.25 MG (50000 UNIT) CAPS capsule   Other Relevant Orders   VITAMIN D 25 Hydroxy (Vit-D Deficiency, Fractures)   Seasonal allergies   Relevant Medications   levocetirizine (XYZAL) 5 MG tablet   montelukast (SINGULAIR) 10 MG tablet   Gastroesophageal reflux disease without esophagitis    Refill pepcid       Relevant Medications   famotidine (PEPCID) 40 MG tablet   Morbid obesity (HCC)    Pt losing weight with goli apple cidar vinegar supp and diet       Other hyperlipidemia    Encourage heart healthy diet such as MIND or DASH diet, increase exercise, avoid trans fats, simple carbohydrates and processed foods, consider a krill or fish or flaxseed oil cap daily.       Preventative health care - Primary    ghm utd Check labs See avs      Relevant Orders   CBC with Differential/Platelet   Lipid panel   TSH   Comprehensive metabolic panel   Hemoglobin A1c   Other Visit Diagnoses     Need for hepatitis C screening test       Relevant Orders   Hepatitis C antibody   Uncontrolled type 2 diabetes mellitus with hyperglycemia (HCC)       Relevant Medications   Prenatal Multivit-Min-Fe-FA (PRENATAL/IRON) TABS   Acne vulgaris       Relevant Medications   tretinoin (RETIN-A) 0.025 % cream        Meds ordered this encounter  Medications   famotidine (PEPCID) 40 MG tablet    Sig: TAKE 1 TABLET (40 MG TOTAL) BY MOUTH DAILY.    Dispense:  90 tablet    Refill:  3   levocetirizine (XYZAL) 5 MG tablet    Sig:  TAKE 1 TABLET (5 MG TOTAL) BY MOUTH 2 (TWO) TIMES DAILY.    Dispense:  180 tablet    Refill:  3   montelukast (SINGULAIR) 10 MG tablet    Sig: TAKE 1 TABLET (10 MG TOTAL) BY MOUTH AT BEDTIME.    Dispense:  90 tablet    Refill:  1   Prenatal Multivit-Min-Fe-FA (PRENATAL/IRON) TABS    Sig: Take 1 tablet by mouth daily.    Dispense:  90 tablet    Refill:  1   tretinoin (RETIN-A) 0.025 % cream    Sig: Apply topically at bedtime.    Dispense:  45 g    Refill:  3   Vitamin D, Ergocalciferol, (DRISDOL) 1.25 MG (50000 UNIT) CAPS capsule    Sig: TAKE 1 CAPSULE (50,000 UNITS TOTAL) BY MOUTH EVERY 7 (SEVEN) DAYS.    Dispense:  12 capsule    Refill:  2    I, Ann Held, DO, personally preformed the services described in this documentation.  All medical record entries  made by the scribe were at my direction and in my presence.  I have reviewed the chart and discharge instructions (if applicable) and agree that the record reflects my personal performance and is accurate and complete. 01/24/2021   I,Shehryar Baig,acting as a Education administrator for [employer], DO.,have documented all relevant documentation on the behalf of Ann Held, DO,as directed by  Ann Held, DO while in the presence of Ann Held, DO.   Ann Held, DO

## 2021-01-24 NOTE — Assessment & Plan Note (Signed)
Pt losing weight with goli apple cidar vinegar supp and diet

## 2021-01-24 NOTE — Assessment & Plan Note (Signed)
Encourage heart healthy diet such as MIND or DASH diet, increase exercise, avoid trans fats, simple carbohydrates and processed foods, consider a krill or fish or flaxseed oil cap daily.  °

## 2021-01-24 NOTE — Assessment & Plan Note (Signed)
ghm utd Check labs  See avs  

## 2021-01-24 NOTE — Assessment & Plan Note (Signed)
Refill pepcid

## 2021-01-24 NOTE — Patient Instructions (Signed)
Preventive Care 21-37 Years Old, Female Preventive care refers to lifestyle choices and visits with your health care provider that can promote health and wellness. This includes: A yearly physical exam. This is also called an annual wellness visit. Regular dental and eye exams. Immunizations. Screening for certain conditions. Healthy lifestyle choices, such as: Eating a healthy diet. Getting regular exercise. Not using drugs or products that contain nicotine and tobacco. Limiting alcohol use. What can I expect for my preventive care visit? Physical exam Your health care provider may check your: Height and weight. These may be used to calculate your BMI (body mass index). BMI is a measurement that tells if you are at a healthy weight. Heart rate and blood pressure. Body temperature. Skin for abnormal spots. Counseling Your health care provider may ask you questions about your: Past medical problems. Family's medical history. Alcohol, tobacco, and drug use. Emotional well-being. Home life and relationship well-being. Sexual activity. Diet, exercise, and sleep habits. Work and work environment. Access to firearms. Method of birth control. Menstrual cycle. Pregnancy history. What immunizations do I need? Vaccines are usually given at various ages, according to a schedule. Your health care provider will recommend vaccines for you based on your age, medical history, and lifestyle or other factors, such as travel or where you work. What tests do I need? Blood tests Lipid and cholesterol levels. These may be checked every 5 years starting at age 20. Hepatitis C test. Hepatitis B test. Screening Diabetes screening. This is done by checking your blood sugar (glucose) after you have not eaten for a while (fasting). STD (sexually transmitted disease) testing, if you are at risk. BRCA-related cancer screening. This may be done if you have a family history of breast, ovarian, tubal, or  peritoneal cancers. Pelvic exam and Pap test. This may be done every 3 years starting at age 21. Starting at age 30, this may be done every 5 years if you have a Pap test in combination with an HPV test. Talk with your health care provider about your test results, treatment options, and if necessary, the need for more tests. Follow these instructions at home: Eating and drinking  Eat a healthy diet that includes fresh fruits and vegetables, whole grains, lean protein, and low-fat dairy products. Take vitamin and mineral supplements as recommended by your health care provider. Do not drink alcohol if: Your health care provider tells you not to drink. You are pregnant, may be pregnant, or are planning to become pregnant. If you drink alcohol: Limit how much you have to 0-1 drink a day. Be aware of how much alcohol is in your drink. In the [place] one drink equals one 12 oz bottle of beer (355 mL), one 5 oz glass of wine (148 mL), or one 1 oz glass of hard liquor (44 mL). Lifestyle Take daily care of your teeth and gums. Brush your teeth every morning and night with fluoride toothpaste. Floss one time each day. Stay active. Exercise for at least 30 minutes 5 or more days each week. Do not use any products that contain nicotine or tobacco, such as cigarettes, e-cigarettes, and chewing tobacco. If you need help quitting, ask your health care provider. Do not use drugs. If you are sexually active, practice safe sex. Use a condom or other form of protection to prevent STIs (sexually transmitted infections). If you do not wish to become pregnant, use a form of birth control. If you plan to become pregnant, see your health care provider   for a prepregnancy visit. Find healthy ways to cope with stress, such as: Meditation, yoga, or listening to music. Journaling. Talking to a trusted person. Spending time with friends and family. Safety Always wear your seat belt while driving or riding in a  vehicle. Do not drive: If you have been drinking alcohol. Do not ride with someone who has been drinking. When you are tired or distracted. While texting. Wear a helmet and other protective equipment during sports activities. If you have firearms in your house, make sure you follow all gun safety procedures. Seek help if you have been physically or sexually abused. What's next? Go to your health care provider once a year for an annual wellness visit. Ask your health care provider how often you should have your eyes and teeth checked. Stay up to date on all vaccines. This information is not intended to replace advice given to you by your health care provider. Make sure you discuss any questions you have with your health care provider. Document Revised: 07/08/2020 Document Reviewed: 01/09/2018 Elsevier Patient Education  2022 Else[employer] Inc.  

## 2021-01-25 ENCOUNTER — Other Ambulatory Visit (HOSPITAL_BASED_OUTPATIENT_CLINIC_OR_DEPARTMENT_OTHER): Payer: Self-pay

## 2021-01-25 LAB — COMPREHENSIVE METABOLIC PANEL
ALT: 13 U/L (ref 0–35)
AST: 15 U/L (ref 0–37)
Albumin: 4 g/dL (ref 3.5–5.2)
Alkaline Phosphatase: 57 U/L (ref 39–117)
BUN: 13 mg/dL (ref 6–23)
CO2: 26 mEq/L (ref 19–32)
Calcium: 8.8 mg/dL (ref 8.4–10.5)
Chloride: 105 mEq/L (ref 96–112)
Creatinine, Ser: 0.89 mg/dL (ref 0.40–1.20)
GFR: 82.75 mL/min (ref >60.00)
Glucose, Bld: 87 mg/dL (ref 70–99)
Potassium: 4.2 mEq/L (ref 3.5–5.1)
Sodium: 139 mEq/L (ref 135–145)
Total Bilirubin: 0.1 mg/dL — ABNORMAL LOW (ref 0.2–1.2)
Total Protein: 7.1 g/dL (ref 6.0–8.3)

## 2021-01-25 LAB — CBC WITH DIFFERENTIAL/PLATELET
Basophils Absolute: 0.1 10*3/uL (ref 0.0–0.1)
Basophils Relative: 0.9 % (ref 0.0–3.0)
Eosinophils Absolute: 0.4 10*3/uL (ref 0.0–0.7)
Eosinophils Relative: 6.3 % — ABNORMAL HIGH (ref 0.0–5.0)
HCT: 34.1 % — ABNORMAL LOW (ref 36.0–46.0)
Hemoglobin: 10.9 g/dL — ABNORMAL LOW (ref 12.0–15.0)
Lymphocytes Relative: 46.6 % — ABNORMAL HIGH (ref 12.0–46.0)
Lymphs Abs: 2.8 10*3/uL (ref 0.7–4.0)
MCHC: 31.9 g/dL (ref 30.0–36.0)
MCV: 84.4 fl (ref 78.0–100.0)
Monocytes Absolute: 0.5 10*3/uL (ref 0.1–1.0)
Monocytes Relative: 7.7 % (ref 3.0–12.0)
Neutro Abs: 2.3 10*3/uL (ref 1.4–7.7)
Neutrophils Relative %: 38.5 % — ABNORMAL LOW (ref 43.0–77.0)
Platelets: 325 10*3/uL (ref 150.0–400.0)
RBC: 4.04 Mil/uL (ref 3.87–5.11)
RDW: 15.5 % (ref 11.5–15.5)
WBC: 6.1 10*3/uL (ref 4.0–10.5)

## 2021-01-25 LAB — LIPID PANEL
Cholesterol: 166 mg/dL (ref 0–200)
HDL: 42.7 mg/dL (ref >39.00)
LDL Cholesterol: 91 mg/dL (ref 0–99)
NonHDL: 122.97
Total CHOL/HDL Ratio: 4
Triglycerides: 158 mg/dL — ABNORMAL HIGH (ref 0.0–149.0)
VLDL: 31.6 mg/dL (ref 0.0–40.0)

## 2021-01-25 LAB — HEMOGLOBIN A1C: Hgb A1c MFr Bld: 5.8 % (ref 4.6–6.5)

## 2021-01-25 LAB — HEPATITIS C ANTIBODY
Hepatitis C Ab: NONREACTIVE
SIGNAL TO CUT-OFF: 0.01 (ref <1.00)

## 2021-01-25 LAB — TSH: TSH: 0.92 u[IU]/mL (ref 0.35–5.50)

## 2021-01-26 ENCOUNTER — Other Ambulatory Visit (HOSPITAL_BASED_OUTPATIENT_CLINIC_OR_DEPARTMENT_OTHER): Payer: Self-pay

## 2021-01-31 ENCOUNTER — Other Ambulatory Visit (HOSPITAL_BASED_OUTPATIENT_CLINIC_OR_DEPARTMENT_OTHER): Payer: Self-pay

## 2021-02-14 ENCOUNTER — Telehealth: Payer: Self-pay
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
## 2021-04-11 ENCOUNTER — Other Ambulatory Visit (HOSPITAL_BASED_OUTPATIENT_CLINIC_OR_DEPARTMENT_OTHER): Payer: Self-pay

## 2021-04-12 ENCOUNTER — Other Ambulatory Visit (HOSPITAL_BASED_OUTPATIENT_CLINIC_OR_DEPARTMENT_OTHER): Payer: Self-pay

## 2021-04-17 ENCOUNTER — Other Ambulatory Visit: Payer: Self-pay | Admitting: Family Medicine

## 2021-04-17 ENCOUNTER — Other Ambulatory Visit (HOSPITAL_BASED_OUTPATIENT_CLINIC_OR_DEPARTMENT_OTHER): Payer: Self-pay

## 2021-04-17 ENCOUNTER — Encounter: Payer: Self-pay | Admitting: Family Medicine

## 2021-04-17 MED ORDER — FLUCONAZOLE 150 MG PO TABS
ORAL_TABLET | ORAL | 5 refills | Status: DC
  Filled 2021-04-17: qty 2, 3d supply, fill #0
  Filled 2021-07-21: qty 2, 3d supply, fill #1
  Filled 2021-08-22: qty 2, 3d supply, fill #2

## 2021-06-15 ENCOUNTER — Encounter: Payer: Self-pay | Admitting: Family Medicine

## 2021-06-15 ENCOUNTER — Other Ambulatory Visit (HOSPITAL_BASED_OUTPATIENT_CLINIC_OR_DEPARTMENT_OTHER): Payer: Self-pay

## 2021-06-15 ENCOUNTER — Ambulatory Visit (INDEPENDENT_AMBULATORY_CARE_PROVIDER_SITE_OTHER): Payer: 59 | Admitting: Family Medicine

## 2021-06-15 VITALS — BP 134/86 | HR 104 | Temp 98.6°F | Resp 18 | Ht 66.0 in | Wt 219.0 lb

## 2021-06-15 DIAGNOSIS — R000 Tachycardia, unspecified: Secondary | ICD-10-CM

## 2021-06-15 DIAGNOSIS — R531 Weakness: Secondary | ICD-10-CM | POA: Diagnosis not present

## 2021-06-15 DIAGNOSIS — R7303 Prediabetes: Secondary | ICD-10-CM

## 2021-06-15 DIAGNOSIS — R42 Dizziness and giddiness: Secondary | ICD-10-CM | POA: Diagnosis not present

## 2021-06-15 DIAGNOSIS — R739 Hyperglycemia, unspecified: Secondary | ICD-10-CM | POA: Diagnosis not present

## 2021-06-15 DIAGNOSIS — I471 Supraventricular tachycardia: Secondary | ICD-10-CM

## 2021-06-15 DIAGNOSIS — M545 Low back pain, unspecified: Secondary | ICD-10-CM | POA: Diagnosis not present

## 2021-06-15 DIAGNOSIS — N912 Amenorrhea, unspecified: Secondary | ICD-10-CM

## 2021-06-15 DIAGNOSIS — I10 Essential (primary) hypertension: Secondary | ICD-10-CM

## 2021-06-15 DIAGNOSIS — E1165 Type 2 diabetes mellitus with hyperglycemia: Secondary | ICD-10-CM | POA: Insufficient documentation

## 2021-06-15 DIAGNOSIS — E7849 Other hyperlipidemia: Secondary | ICD-10-CM

## 2021-06-15 MED ORDER — METOPROLOL SUCCINATE ER 25 MG PO TB24
25.0000 mg | ORAL_TABLET | Freq: Every evening | ORAL | 1 refills | Status: DC
  Filled 2021-06-15: qty 90, 90d supply, fill #0

## 2021-06-15 MED ORDER — CYCLOBENZAPRINE HCL 10 MG PO TABS
10.0000 mg | ORAL_TABLET | Freq: Three times a day (TID) | ORAL | 0 refills | Status: DC | PRN
  Filled 2021-06-15: qty 30, 10d supply, fill #0

## 2021-06-15 MED ORDER — MELOXICAM 15 MG PO TABS
15.0000 mg | ORAL_TABLET | Freq: Every day | ORAL | 0 refills | Status: DC
  Filled 2021-06-15: qty 30, 30d supply, fill #0

## 2021-06-15 NOTE — Assessment & Plan Note (Signed)
Pt stopped metoprolol Refill and f/u 3 months
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
## 2021-06-15 NOTE — Assessment & Plan Note (Signed)
Encourage heart healthy diet such as MIND or DASH diet, increase exercise, avoid trans fats, simple carbohydrates and processed foods, consider a krill or fish or flaxseed oil cap daily.  °

## 2021-06-15 NOTE — Assessment & Plan Note (Signed)
Poorly controlled will alter medications, encouraged DASH diet, minimize caffeine and obtain adequate sleep. Report concerning symptoms and follow up as directed and as needed 
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
## 2021-06-15 NOTE — Assessment & Plan Note (Signed)
Check labs today.

## 2021-06-16 LAB — CBC WITH DIFFERENTIAL/PLATELET
Basophils Absolute: 0 10*3/uL (ref 0.0–0.1)
Basophils Relative: 0.5 % (ref 0.0–3.0)
Eosinophils Absolute: 0.2 10*3/uL (ref 0.0–0.7)
Eosinophils Relative: 2.9 % (ref 0.0–5.0)
HCT: 36.2 % (ref 36.0–46.0)
Hemoglobin: 11.7 g/dL — ABNORMAL LOW (ref 12.0–15.0)
Lymphocytes Relative: 47.4 % — ABNORMAL HIGH (ref 12.0–46.0)
Lymphs Abs: 3.2 10*3/uL (ref 0.7–4.0)
MCHC: 32.4 g/dL (ref 30.0–36.0)
MCV: 84.9 fl (ref 78.0–100.0)
Monocytes Absolute: 0.5 10*3/uL (ref 0.1–1.0)
Monocytes Relative: 7.5 % (ref 3.0–12.0)
Neutro Abs: 2.8 10*3/uL (ref 1.4–7.7)
Neutrophils Relative %: 41.7 % — ABNORMAL LOW (ref 43.0–77.0)
Platelets: 352 10*3/uL (ref 150.0–400.0)
RBC: 4.27 Mil/uL (ref 3.87–5.11)
RDW: 14.6 % (ref 11.5–15.5)
WBC: 6.6 10*3/uL (ref 4.0–10.5)

## 2021-06-16 LAB — LIPID PANEL
Cholesterol: 167 mg/dL (ref 0–200)
HDL: 45.4 mg/dL (ref >39.00)
LDL Cholesterol: 96 mg/dL (ref 0–99)
NonHDL: 122.06
Total CHOL/HDL Ratio: 4
Triglycerides: 130 mg/dL (ref 0.0–149.0)
VLDL: 26 mg/dL (ref 0.0–40.0)

## 2021-06-16 LAB — COMPREHENSIVE METABOLIC PANEL
ALT: 9 U/L (ref 0–35)
AST: 13 U/L (ref 0–37)
Albumin: 4.1 g/dL (ref 3.5–5.2)
Alkaline Phosphatase: 55 U/L (ref 39–117)
BUN: 12 mg/dL (ref 6–23)
CO2: 30 mEq/L (ref 19–32)
Calcium: 9.4 mg/dL (ref 8.4–10.5)
Chloride: 103 mEq/L (ref 96–112)
Creatinine, Ser: 0.9 mg/dL (ref 0.40–1.20)
GFR: 81.42 mL/min (ref >60.00)
Glucose, Bld: 99 mg/dL (ref 70–99)
Potassium: 4.5 mEq/L (ref 3.5–5.1)
Sodium: 137 mEq/L (ref 135–145)
Total Bilirubin: 0.2 mg/dL (ref 0.2–1.2)
Total Protein: 7 g/dL (ref 6.0–8.3)

## 2021-06-16 LAB — HCG, SERUM, QUALITATIVE: Preg, Serum: NEGATIVE

## 2021-06-16 LAB — VITAMIN D 25 HYDROXY (VIT D DEFICIENCY, FRACTURES): VITD: 40.41 ng/mL (ref 30.00–100.00)

## 2021-06-16 LAB — HEMOGLOBIN A1C: Hgb A1c MFr Bld: 5.7 % (ref 4.6–6.5)

## 2021-06-16 LAB — VITAMIN B12: Vitamin B-12: 487 pg/mL (ref 211–911)

## 2021-06-16 LAB — TSH: TSH: 0.95 u[IU]/mL (ref 0.35–5.50)

## 2021-07-14 ENCOUNTER — Other Ambulatory Visit (HOSPITAL_BASED_OUTPATIENT_CLINIC_OR_DEPARTMENT_OTHER): Payer: Self-pay

## 2021-07-17 ENCOUNTER — Other Ambulatory Visit (HOSPITAL_BASED_OUTPATIENT_CLINIC_OR_DEPARTMENT_OTHER): Payer: Self-pay

## 2021-07-17 ENCOUNTER — Other Ambulatory Visit: Payer: Self-pay | Admitting: Family Medicine

## 2021-07-17 DIAGNOSIS — E1165 Type 2 diabetes mellitus with hyperglycemia: Secondary | ICD-10-CM

## 2021-07-17 MED ORDER — PRENATAL 27-0.8 MG PO TABS
1.0000 | ORAL_TABLET | Freq: Every day | ORAL | 3 refills | Status: DC
  Filled 2021-07-17: qty 90, 90d supply, fill #0
  Filled 2021-07-17: qty 100, 100d supply, fill #0
  Filled 2021-10-20: qty 90, 90d supply, fill #1
  Filled 2022-01-31: qty 100, 100d supply, fill #1

## 2021-07-18 ENCOUNTER — Other Ambulatory Visit (HOSPITAL_BASED_OUTPATIENT_CLINIC_OR_DEPARTMENT_OTHER): Payer: Self-pay

## 2021-07-21 ENCOUNTER — Other Ambulatory Visit: Payer: Self-pay | Admitting: Family Medicine

## 2021-07-21 ENCOUNTER — Other Ambulatory Visit (HOSPITAL_BASED_OUTPATIENT_CLINIC_OR_DEPARTMENT_OTHER): Payer: Self-pay

## 2021-07-21 DIAGNOSIS — J302 Other seasonal allergic rhinitis: Secondary | ICD-10-CM

## 2021-07-21 MED ORDER — MONTELUKAST SODIUM 10 MG PO TABS
ORAL_TABLET | Freq: Every day | ORAL | 1 refills | Status: DC
  Filled 2021-07-21: qty 90, 90d supply, fill #0

## 2021-08-22 ENCOUNTER — Other Ambulatory Visit (HOSPITAL_BASED_OUTPATIENT_CLINIC_OR_DEPARTMENT_OTHER): Payer: Self-pay

## 2021-08-25 ENCOUNTER — Other Ambulatory Visit (HOSPITAL_BASED_OUTPATIENT_CLINIC_OR_DEPARTMENT_OTHER): Payer: Self-pay

## 2021-09-12 ENCOUNTER — Other Ambulatory Visit (HOSPITAL_BASED_OUTPATIENT_CLINIC_OR_DEPARTMENT_OTHER): Payer: Self-pay

## 2021-09-12 ENCOUNTER — Encounter: Payer: Self-pay | Admitting: Family Medicine

## 2021-09-12 ENCOUNTER — Ambulatory Visit (INDEPENDENT_AMBULATORY_CARE_PROVIDER_SITE_OTHER): Payer: 59 | Admitting: Family Medicine

## 2021-09-12 VITALS — BP 120/80 | HR 86 | Temp 98.7°F | Resp 18 | Ht 66.0 in | Wt 227.6 lb

## 2021-09-12 DIAGNOSIS — I10 Essential (primary) hypertension: Secondary | ICD-10-CM

## 2021-09-12 MED ORDER — METOPROLOL SUCCINATE ER 25 MG PO TB24
25.0000 mg | ORAL_TABLET | Freq: Every evening | ORAL | 1 refills | Status: DC
Start: 2021-09-12 — End: 2021-12-12
  Filled 2021-09-12: qty 90, 90d supply, fill #0
  Filled 2021-10-20 (×2): qty 90, 90d supply, fill #1

## 2021-09-12 MED ORDER — FLUCONAZOLE 150 MG PO TABS
ORAL_TABLET | ORAL | 3 refills | Status: DC
  Filled 2021-09-12: qty 12, 36d supply, fill #0
  Filled 2022-03-14: qty 12, 36d supply, fill #1
  Filled 2022-09-18: qty 12, 36d supply, fill #2

## 2021-09-12 NOTE — Progress Notes (Signed)
? ?Subjective:  ? ?By signing my name below, I, Zite Okoli, attest that this documentation has been prepared under the direction and in the presence of Ann Held, DO. 09/12/2021 ? ? Patient ID: Katherine Mcfarland, female    DOB: 02/25/1986, 36 y.o.   MRN: [id number] ? ?Chief Complaint  ?Patient presents with  ? Hypertension  ? Follow-up  ? ? ?HPI ?Patient is in today for an office visit and f/u. ? ?She is doing better at this time. ? ?Her blood pressure is stable at this time. ?BP Readings from Last 3 Encounters:  ?09/12/21 120/80  ?06/15/21 134/86  ?01/24/21 117/60  ?  ?She is using 25 mg metoprolol succinate to manage her blood pressure. ? ?Past Medical History:  ?Diagnosis Date  ? Allergy   ? pt. reports seasonal allergies  ? Headache   ? Lactose intolerance   ? Palpitations   ? Right knee pain   ? Tinnitus   ? ? ?Past Surgical History:  ?Procedure Laterality Date  ? CESAREAN SECTION    ? WISDOM TOOTH EXTRACTION    ? ? ?Family History  ?Problem Relation Age of Onset  ? Diabetes Maternal Grandmother   ? Hypertension Maternal Grandmother   ? Hypertension Mother   ? Hyperlipidemia Mother   ? Hypertension Father   ? ? ?Social History  ? ?Socioeconomic History  ? Marital status: Single  ?  Spouse name: Not on file  ? Number of children: Not on file  ? Years of education: Not on file  ? Highest education level: Not on file  ?Occupational History  ? Occupation: CMA  ?  Employer: [employer]  ?Tobacco Use  ? Smoking status: Never  ? Smokeless tobacco: Never  ?Vaping Use  ? Vaping Use: Never used  ?Substance and Sexual Activity  ? Alcohol use: Yes  ?  Comment: social  ? Drug use: No  ? Sexual activity: Yes  ?  Birth control/protection: None  ?Other Topics Concern  ? Not on file  ?Social History Narrative  ? Not on file  ? ?Social Determinants of Health  ? ?Financial Resource Strain: Not on file  ?Food Insecurity: Not on file  ?Transportation Needs: Not on file  ?Physical Activity: Not on file  ?Stress: Not on  file  ?Social Connections: Not on file  ?Intimate Partner Violence: Not on file  ? ? ?Outpatient Medications Prior to Visit  ?Medication Sig Dispense Refill  ? cyclobenzaprine (FLEXERIL) 10 MG tablet Take 1 tablet (10 mg total) by mouth 3 (three) times daily as needed for muscle spasms. 30 tablet 0  ? famotidine (PEPCID) 40 MG tablet TAKE 1 TABLET (40 MG TOTAL) BY MOUTH DAILY. 90 tablet 3  ? levocetirizine (XYZAL) 5 MG tablet TAKE 1 TABLET (5 MG TOTAL) BY MOUTH 2 (TWO) TIMES DAILY. 180 tablet 3  ? meloxicam (MOBIC) 15 MG tablet Take 1 tablet (15 mg total) by mouth daily. 30 tablet 0  ? montelukast (SINGULAIR) 10 MG tablet TAKE 1 TABLET (10 MG TOTAL) BY MOUTH AT BEDTIME. 90 tablet 1  ? Prenatal Vit-Fe Fumarate-FA (MULTIVITAMIN-PRENATAL) 27-0.8 MG TABS tablet Take 1 tablet by mouth daily. 90 tablet 3  ? tretinoin (RETIN-A) 0.025 % cream Apply topically at bedtime. 45 g 3  ? Vitamin D, Ergocalciferol, (DRISDOL) 1.25 MG (50000 UNIT) CAPS capsule TAKE 1 CAPSULE (50,000 UNITS TOTAL) BY MOUTH EVERY 7 (SEVEN) DAYS. 12 capsule 2  ? metoprolol succinate (TOPROL-XL) 25 MG 24 hr tablet Take 1  tablet (25 mg total) by mouth every evening. 90 tablet 1  ? fluconazole (DIFLUCAN) 150 MG tablet Take 1 tablet by mouth once. May repeat in 3 days if needed (Patient not taking: Reported on 09/12/2021) 2 tablet 5  ? ?No facility-administered medications prior to visit.  ? ? ?Allergies  ?Allergen Reactions  ? Metronidazole Nausea And Vomiting  ? Mushroom Extract Complex Diarrhea  ? ? ?Review of Systems  ?Constitutional:  Negative for fever.  ?HENT:  Negative for congestion, ear pain, hearing loss, sinus pain and sore throat.   ?Eyes:  Negative for blurred vision and pain.  ?Respiratory:  Negative for cough, sputum production, shortness of breath and wheezing.   ?Cardiovascular:  Negative for chest pain and palpitations.  ?Gastrointestinal:  Negative for blood in stool, constipation, diarrhea, nausea and vomiting.  ?Genitourinary:  Negative  for dysuria, frequency, hematuria and urgency.  ?Musculoskeletal:  Negative for back pain, falls and myalgias.  ?Neurological:  Negative for dizziness, sensory change, loss of consciousness, weakness and headaches.  ?Endo/Heme/Allergies:  Negative for environmental allergies. Does not bruise/bleed easily.  ?Psychiatric/Behavioral:  Negative for depression and suicidal ideas. The patient is not nervous/anxious and does not have insomnia.   ? ?   ?Objective:  ?  ?Physical Exam ?Constitutional:   ?   General: She is not in acute distress. ?   Appearance: Normal appearance. She is not ill-appearing.  ?HENT:  ?   Head: Normocephalic and atraumatic.  ?   Right Ear: External ear normal.  ?   Left Ear: External ear normal.  ?Eyes:  ?   Extraocular Movements: Extraocular movements intact.  ?   Pupils: Pupils are equal, round, and reactive to light.  ?Cardiovascular:  ?   Rate and Rhythm: Normal rate and regular rhythm.  ?   Pulses: Normal pulses.  ?   Heart sounds: Normal heart sounds. No murmur heard. ?  No gallop.  ?Pulmonary:  ?   Effort: Pulmonary effort is normal. No respiratory distress.  ?   Breath sounds: Normal breath sounds. No wheezing, rhonchi or rales.  ?Abdominal:  ?   General: Bowel sounds are normal. There is no distension.  ?   Palpations: Abdomen is soft. There is no mass.  ?   Tenderness: There is no abdominal tenderness. There is no guarding or rebound.  ?   Hernia: No hernia is present.  ?Musculoskeletal:  ?   Cervical back: Normal range of motion and neck supple.  ?Lymphadenopathy:  ?   Cervical: No cervical adenopathy.  ?Skin: ?   General: Skin is warm and dry.  ?Neurological:  ?   Mental Status: She is alert and oriented to person, place, and time.  ?Psychiatric:     ?   Behavior: Behavior normal.  ? ? ?BP 120/80 (BP Location: Left Arm, Patient Position: Sitting, Cuff Size: Large)   Pulse 86   Temp 98.7 ?F (37.1 ?C) (Oral)   Resp 18   Ht '5\' 6"'$  (1.676 m)   Wt 227 lb 9.6 oz (103.2 kg)   SpO2 97%    BMI 36.74 kg/m?  ?Wt Readings from Last 3 Encounters:  ?09/12/21 227 lb 9.6 oz (103.2 kg)  ?06/15/21 219 lb (99.3 kg)  ?01/24/21 217 lb 12.8 oz (98.8 kg)  ? ? ?Diabetic Foot Exam - Simple   ?No data filed ?  ? ?Lab Results  ?Component Value Date  ? WBC 6.6 06/15/2021  ? HGB 11.7 (L) 06/15/2021  ? HCT 36.2 06/15/2021  ?  PLT 352.0 06/15/2021  ? GLUCOSE 99 06/15/2021  ? CHOL 167 06/15/2021  ? TRIG 130.0 06/15/2021  ? HDL 45.40 06/15/2021  ? [place] 96 06/15/2021  ? ALT 9 06/15/2021  ? AST 13 06/15/2021  ? NA 137 06/15/2021  ? K 4.5 06/15/2021  ? CL 103 06/15/2021  ? CREATININE 0.90 06/15/2021  ? BUN 12 06/15/2021  ? CO2 30 06/15/2021  ? TSH 0.95 06/15/2021  ? HGBA1C 5.7 06/15/2021  ? ? ?Lab Results  ?Component Value Date  ? TSH 0.95 06/15/2021  ? ?Lab Results  ?Component Value Date  ? WBC 6.6 06/15/2021  ? HGB 11.7 (L) 06/15/2021  ? HCT 36.2 06/15/2021  ? MCV 84.9 06/15/2021  ? PLT 352.0 06/15/2021  ? ?Lab Results  ?Component Value Date  ? NA 137 06/15/2021  ? K 4.5 06/15/2021  ? CO2 30 06/15/2021  ? GLUCOSE 99 06/15/2021  ? BUN 12 06/15/2021  ? CREATININE 0.90 06/15/2021  ? BILITOT 0.2 06/15/2021  ? ALKPHOS 55 06/15/2021  ? AST 13 06/15/2021  ? ALT 9 06/15/2021  ? PROT 7.0 06/15/2021  ? ALBUMIN 4.1 06/15/2021  ? CALCIUM 9.4 06/15/2021  ? GFR 81.42 06/15/2021  ? ?Lab Results  ?Component Value Date  ? CHOL 167 06/15/2021  ? ?Lab Results  ?Component Value Date  ? HDL 45.40 06/15/2021  ? ?Lab Results  ?Component Value Date  ? [place] 96 06/15/2021  ? ?Lab Results  ?Component Value Date  ? TRIG 130.0 06/15/2021  ? ?Lab Results  ?Component Value Date  ? CHOLHDL 4 06/15/2021  ? ?Lab Results  ?Component Value Date  ? HGBA1C 5.7 06/15/2021  ? ? ?   ?Assessment & Plan:  ? ?Problem List Items Addressed This Visit   ? ?  ? Unprioritized  ? Primary hypertension - Primary  ?  Well controlled, no changes to meds. Encouraged heart healthy diet such as the DASH diet and exercise as tolerated.  ? ?  ?  ? Relevant Medications  ?  metoprolol succinate (TOPROL-XL) 25 MG 24 hr tablet  ? ? ? ?Meds ordered this encounter  ?Medications  ? metoprolol succinate (TOPROL-XL) 25 MG 24 hr tablet  ?  Sig: Take 1 tablet (25 mg total) by mouth every evening

## 2021-09-12 NOTE — Patient Instructions (Signed)

## 2021-09-18 NOTE — Assessment & Plan Note (Signed)
Well controlled, no changes to meds. Encouraged heart healthy diet such as the DASH diet and exercise as tolerated.  °

## 2021-10-20 ENCOUNTER — Other Ambulatory Visit (HOSPITAL_COMMUNITY): Payer: Self-pay

## 2021-10-20 ENCOUNTER — Other Ambulatory Visit (HOSPITAL_BASED_OUTPATIENT_CLINIC_OR_DEPARTMENT_OTHER): Payer: Self-pay

## 2021-12-12 ENCOUNTER — Encounter: Payer: Self-pay | Admitting: Family Medicine

## 2021-12-12 ENCOUNTER — Other Ambulatory Visit (HOSPITAL_BASED_OUTPATIENT_CLINIC_OR_DEPARTMENT_OTHER): Payer: Self-pay

## 2021-12-12 ENCOUNTER — Ambulatory Visit (INDEPENDENT_AMBULATORY_CARE_PROVIDER_SITE_OTHER): Payer: Commercial Managed Care - HMO | Admitting: Family Medicine

## 2021-12-12 DIAGNOSIS — E559 Vitamin D deficiency, unspecified: Secondary | ICD-10-CM | POA: Diagnosis not present

## 2021-12-12 DIAGNOSIS — E1165 Type 2 diabetes mellitus with hyperglycemia: Secondary | ICD-10-CM

## 2021-12-12 DIAGNOSIS — J302 Other seasonal allergic rhinitis: Secondary | ICD-10-CM

## 2021-12-12 DIAGNOSIS — K219 Gastro-esophageal reflux disease without esophagitis: Secondary | ICD-10-CM | POA: Diagnosis not present

## 2021-12-12 DIAGNOSIS — I10 Essential (primary) hypertension: Secondary | ICD-10-CM

## 2021-12-12 MED ORDER — VITAMIN D (ERGOCALCIFEROL) 1.25 MG (50000 UNIT) PO CAPS
50000.0000 [IU] | ORAL_CAPSULE | ORAL | 2 refills | Status: DC
Start: 2021-12-12 — End: 2022-07-19
  Filled 2021-12-12: qty 12, 84d supply, fill #0
  Filled 2022-03-14: qty 12, 84d supply, fill #1
  Filled 2022-06-22: qty 12, 84d supply, fill #2

## 2021-12-12 MED ORDER — METOPROLOL SUCCINATE ER 25 MG PO TB24
25.0000 mg | ORAL_TABLET | Freq: Every evening | ORAL | 3 refills | Status: DC
  Filled 2021-12-12 (×2): qty 90, 90d supply, fill #0
  Filled 2022-03-14: qty 90, 90d supply, fill #1
  Filled 2022-04-15: qty 90, 90d supply, fill #2

## 2021-12-12 MED ORDER — FAMOTIDINE 40 MG PO TABS
ORAL_TABLET | Freq: Every day | ORAL | 3 refills | Status: DC
  Filled 2021-12-12 (×2): qty 90, 90d supply, fill #0
  Filled 2022-04-09: qty 90, 90d supply, fill #1
  Filled 2022-06-22: qty 90, 90d supply, fill #2

## 2021-12-12 MED ORDER — MONTELUKAST SODIUM 10 MG PO TABS
ORAL_TABLET | Freq: Every day | ORAL | 1 refills | Status: DC
  Filled 2021-12-12: qty 90, 90d supply, fill #0
  Filled 2022-03-14: qty 90, 90d supply, fill #1

## 2021-12-12 NOTE — Assessment & Plan Note (Signed)
Well controlled, no changes to meds. Encouraged heart healthy diet such as the DASH diet and exercise as tolerated.  °

## 2021-12-12 NOTE — Progress Notes (Addendum)
Subjective:   By signing my name below, I, Luiz Ochoa, attest that this documentation has been prepared under the direction and in the presence of Ann Held, DO 12/12/2021   Patient ID: Katherine Mcfarland, female    DOB: Jul 08, 1985, 36 y.o.   MRN: [id number]  Chief Complaint  Patient presents with   Hypertension   Follow-up    HPI Patient is in today for follow up visit.  She has been having difficulties losing weight despite eating the apple cider vinegar gummies and fasting once week. She has been taking the multivitamin with iron supplements.   Her blood pressure has been well controlled. She also requests a refill of medication to treat her heartburn and Vitamin D.   Past Medical History:  Diagnosis Date   Allergy    pt. reports seasonal allergies   Headache    Lactose intolerance    Palpitations    Right knee pain    Tinnitus     Past Surgical History:  Procedure Laterality Date   CESAREAN SECTION     WISDOM TOOTH EXTRACTION      Family History  Problem Relation Age of Onset   Diabetes Maternal Grandmother    Hypertension Maternal Grandmother    Hypertension Mother    Hyperlipidemia Mother    Hypertension Father     Social History   Socioeconomic History   Marital status: Single    Spouse name: Not on file   Number of children: Not on file   Years of education: Not on file   Highest education level: Not on file  Occupational History   Occupation: CMA    Employer: [employer]  Tobacco Use   Smoking status: Never   Smokeless tobacco: Never  Vaping Use   Vaping Use: Never used  Substance and Sexual Activity   Alcohol use: Yes    Comment: social   Drug use: No   Sexual activity: Yes    Birth control/protection: None  Other Topics Concern   Not on file  Social History Narrative   Not on file   Social Determinants of Health   Financial Resource Strain: Not on file  Food Insecurity: Not on file  Transportation Needs: Not on  file  Physical Activity: Not on file  Stress: Not on file  Social Connections: Not on file  Intimate Partner Violence: Not on file    Outpatient Medications Prior to Visit  Medication Sig Dispense Refill   cyclobenzaprine (FLEXERIL) 10 MG tablet Take 1 tablet (10 mg total) by mouth 3 (three) times daily as needed for muscle spasms. 30 tablet 0   fluconazole (DIFLUCAN) 150 MG tablet Take 1 tablet by mouth once. May repeat in 3 days if needed 12 tablet 3   levocetirizine (XYZAL) 5 MG tablet TAKE 1 TABLET (5 MG TOTAL) BY MOUTH 2 (TWO) TIMES DAILY. 180 tablet 3   meloxicam (MOBIC) 15 MG tablet Take 1 tablet (15 mg total) by mouth daily. 30 tablet 0   Prenatal Vit-Fe Fumarate-FA (MULTIVITAMIN-PRENATAL) 27-0.8 MG TABS tablet Take 1 tablet by mouth daily. 90 tablet 3   tretinoin (RETIN-A) 0.025 % cream Apply topically at bedtime. 45 g 3   famotidine (PEPCID) 40 MG tablet TAKE 1 TABLET (40 MG TOTAL) BY MOUTH DAILY. 90 tablet 3   metoprolol succinate (TOPROL-XL) 25 MG 24 hr tablet Take 1 tablet (25 mg total) by mouth every evening. 90 tablet 1   montelukast (SINGULAIR) 10 MG tablet TAKE 1 TABLET (  10 MG TOTAL) BY MOUTH AT BEDTIME. 90 tablet 1   Vitamin D, Ergocalciferol, (DRISDOL) 1.25 MG (50000 UNIT) CAPS capsule TAKE 1 CAPSULE (50,000 UNITS TOTAL) BY MOUTH EVERY 7 (SEVEN) DAYS. 12 capsule 2   No facility-administered medications prior to visit.    Allergies  Allergen Reactions   Metronidazole Nausea And Vomiting   Mushroom Extract Complex Diarrhea    Review of Systems  Constitutional:  Negative for fever and malaise/fatigue.  HENT:  Negative for congestion.   Eyes:  Negative for blurred vision.  Respiratory:  Negative for cough and shortness of breath.   Cardiovascular:  Negative for chest pain, palpitations and leg swelling.  Gastrointestinal:  Positive for heartburn. Negative for vomiting.  Musculoskeletal:  Negative for back pain.  Skin:  Negative for rash.  Neurological:  Negative  for loss of consciousness and headaches.       Objective:    Physical Exam Vitals and nursing note reviewed.  Constitutional:      Appearance: Normal appearance. She is not ill-appearing.  HENT:     Head: Normocephalic and atraumatic.     Right Ear: External ear normal.     Left Ear: External ear normal.  Eyes:     Extraocular Movements: Extraocular movements intact.     Pupils: Pupils are equal, round, and reactive to light.  Cardiovascular:     Rate and Rhythm: Normal rate and regular rhythm.     Pulses: Normal pulses.     Heart sounds: Normal heart sounds. No murmur heard.    No gallop.  Pulmonary:     Effort: Pulmonary effort is normal. No respiratory distress.     Breath sounds: Normal breath sounds. No wheezing or rales.  Skin:    General: Skin is warm and dry.  Neurological:     Mental Status: She is alert and oriented to person, place, and time.  Psychiatric:        Judgment: Judgment normal.     BP 118/78 (BP Location: Right Arm, Patient Position: Sitting, Cuff Size: Large)   Pulse 78   Temp 98.8 F (37.1 C) (Oral)   Resp 18   Ht '5\' 6"'$  (1.676 m)   Wt 227 lb 6.4 oz (103.1 kg)   SpO2 98%   BMI 36.70 kg/m  Wt Readings from Last 3 Encounters:  12/12/21 227 lb 6.4 oz (103.1 kg)  09/12/21 227 lb 9.6 oz (103.2 kg)  06/15/21 219 lb (99.3 kg)    Diabetic Foot Exam - Simple   No data filed    Lab Results  Component Value Date   WBC 6.6 06/15/2021   HGB 11.7 (L) 06/15/2021   HCT 36.2 06/15/2021   PLT 352.0 06/15/2021   GLUCOSE 99 06/15/2021   CHOL 167 06/15/2021   TRIG 130.0 06/15/2021   HDL 45.40 06/15/2021   LDLCALC 96 06/15/2021   ALT 9 06/15/2021   AST 13 06/15/2021   NA 137 06/15/2021   K 4.5 06/15/2021   CL 103 06/15/2021   CREATININE 0.90 06/15/2021   BUN 12 06/15/2021   CO2 30 06/15/2021   TSH 0.95 06/15/2021   HGBA1C 5.7 06/15/2021    Lab Results  Component Value Date   TSH 0.95 06/15/2021   Lab Results  Component Value Date    WBC 6.6 06/15/2021   HGB 11.7 (L) 06/15/2021   HCT 36.2 06/15/2021   MCV 84.9 06/15/2021   PLT 352.0 06/15/2021   Lab Results  Component Value Date   NA  137 06/15/2021   K 4.5 06/15/2021   CO2 30 06/15/2021   GLUCOSE 99 06/15/2021   BUN 12 06/15/2021   CREATININE 0.90 06/15/2021   BILITOT 0.2 06/15/2021   ALKPHOS 55 06/15/2021   AST 13 06/15/2021   ALT 9 06/15/2021   PROT 7.0 06/15/2021   ALBUMIN 4.1 06/15/2021   CALCIUM 9.4 06/15/2021   GFR 81.42 06/15/2021   Lab Results  Component Value Date   CHOL 167 06/15/2021   Lab Results  Component Value Date   HDL 45.40 06/15/2021   Lab Results  Component Value Date   LDLCALC 96 06/15/2021   Lab Results  Component Value Date   TRIG 130.0 06/15/2021   Lab Results  Component Value Date   CHOLHDL 4 06/15/2021   Lab Results  Component Value Date   HGBA1C 5.7 06/15/2021       Assessment & Plan:   Problem List Items Addressed This Visit       Unprioritized   Vitamin D deficiency   Relevant Medications   Vitamin D, Ergocalciferol, (DRISDOL) 1.25 MG (50000 UNIT) CAPS capsule   Seasonal allergies   Relevant Medications   montelukast (SINGULAIR) 10 MG tablet   Gastroesophageal reflux disease without esophagitis   Relevant Medications   famotidine (PEPCID) 40 MG tablet   Uncontrolled type 2 diabetes mellitus with hyperglycemia (HCC)    Diet controlled Lab Results  Component Value Date   HGBA1C 5.7 06/15/2021         Primary hypertension    Well controlled, no changes to meds. Encouraged heart healthy diet such as the DASH diet and exercise as tolerated.       Relevant Medications   metoprolol succinate (TOPROL-XL) 25 MG 24 hr tablet     Meds ordered this encounter  Medications   metoprolol succinate (TOPROL-XL) 25 MG 24 hr tablet    Sig: Take 1 tablet (25 mg total) by mouth every evening.    Dispense:  90 tablet    Refill:  3   famotidine (PEPCID) 40 MG tablet    Sig: TAKE 1 TABLET (40 MG TOTAL)  BY MOUTH DAILY.    Dispense:  90 tablet    Refill:  3   montelukast (SINGULAIR) 10 MG tablet    Sig: TAKE 1 TABLET (10 MG TOTAL) BY MOUTH AT BEDTIME.    Dispense:  90 tablet    Refill:  1   Vitamin D, Ergocalciferol, (DRISDOL) 1.25 MG (50000 UNIT) CAPS capsule    Sig: TAKE 1 CAPSULE (50,000 UNITS TOTAL) BY MOUTH EVERY 7 (SEVEN) DAYS.    Dispense:  12 capsule    Refill:  2    I, Ann Held, DO, personally preformed the services described in this documentation.  All medical record entries made by the scribe were at my direction and in my presence.  I have reviewed the chart and discharge instructions (if applicable) and agree that the record reflects my personal performance and is accurate and complete. 12/12/2021   I,Tinashe Williams,acting as a scribe for Ann Held, DO.,have documented all relevant documentation on the behalf of Ann Held, DO,as directed by  Ann Held, DO while in the presence of Ann Held, DO.    Ann Held, DO

## 2021-12-12 NOTE — Patient Instructions (Signed)

## 2021-12-12 NOTE — Assessment & Plan Note (Signed)
Diet controlled Lab Results  Component Value Date   HGBA1C 5.7 06/15/2021

## 2022-01-09 ENCOUNTER — Ambulatory Visit (INDEPENDENT_AMBULATORY_CARE_PROVIDER_SITE_OTHER): Payer: Commercial Managed Care - HMO | Admitting: Family

## 2022-01-09 ENCOUNTER — Encounter: Payer: Self-pay | Admitting: Family

## 2022-01-09 VITALS — BP 130/80 | HR 85 | Temp 98.2°F | Ht 66.0 in | Wt 224.0 lb

## 2022-01-09 DIAGNOSIS — N926 Irregular menstruation, unspecified: Secondary | ICD-10-CM

## 2022-01-09 DIAGNOSIS — R519 Headache, unspecified: Secondary | ICD-10-CM | POA: Diagnosis not present

## 2022-01-09 LAB — POCT URINE PREGNANCY: Preg Test, Ur: NEGATIVE

## 2022-01-09 MED ORDER — KETOROLAC TROMETHAMINE 60 MG/2ML IM SOLN
60.0000 mg | Freq: Once | INTRAMUSCULAR | Status: AC
  Administered 2022-01-09: 60 mg via INTRAMUSCULAR

## 2022-01-09 NOTE — Progress Notes (Signed)
Katherine Mcfarland is a 36 y.o. female with the following history as recorded in Epic[facility]:  Patient Active Problem List   Diagnosis Date Noted   Tachycardia 06/15/2021   Primary hypertension 06/15/2021   Uncontrolled type 2 diabetes mellitus with hyperglycemia ([place]) 06/15/2021   Preventative health care 01/24/2021   Gastroesophageal reflux disease without esophagitis 11/18/2020   Depression with anxiety 07/28/2019   Vaginal odor 07/28/2019   Late period 07/28/2019   Seasonal allergies 04/03/2019   PSVT (paroxysmal supraventricular tachycardia) ([place]) 01/27/2019   Infertility counseling 10/18/2017   Other hyperlipidemia 01/09/2017   Vitamin D deficiency 11/07/2016   Prediabetes 11/07/2016   Class 2 obesity with serious comorbidity and body mass index (BMI) of 37.0 to 37.9 in adult 11/07/2016   Anxiety 09/25/2016   Palpitations 02/07/2016   Morbid obesity ([place]) 02/07/2016   Tinnitus of right ear 01/25/2016   Migraine 10/26/2014    Current Outpatient Medications  Medication Sig Dispense Refill   famotidine (PEPCID) 40 MG tablet TAKE 1 TABLET (40 MG TOTAL) BY MOUTH DAILY. 90 tablet 3   fluconazole (DIFLUCAN) 150 MG tablet Take 1 tablet by mouth once. May repeat in 3 days if needed 12 tablet 3   levocetirizine (XYZAL) 5 MG tablet TAKE 1 TABLET (5 MG TOTAL) BY MOUTH 2 (TWO) TIMES DAILY. 180 tablet 3   metoprolol succinate (TOPROL-XL) 25 MG 24 hr tablet Take 1 tablet (25 mg total) by mouth every evening. 90 tablet 3   montelukast (SINGULAIR) 10 MG tablet TAKE 1 TABLET (10 MG TOTAL) BY MOUTH AT BEDTIME. 90 tablet 1   Prenatal Vit-Fe Fumarate-FA (MULTIVITAMIN-PRENATAL) 27-0.8 MG TABS tablet Take 1 tablet by mouth daily. 90 tablet 3   tretinoin (RETIN-A) 0.025 % cream Apply topically at bedtime. 45 g 3   Vitamin D, Ergocalciferol, (DRISDOL) 1.25 MG (50000 UNIT) CAPS capsule Take 1 capsule (50,000 Units total) by mouth once a week. 12 capsule 2   cyclobenzaprine (FLEXERIL) 10 MG tablet Take 1  tablet (10 mg total) by mouth 3 (three) times daily as needed for muscle spasms. (Patient not taking: Reported on 01/09/2022) 30 tablet 0   meloxicam (MOBIC) 15 MG tablet Take 1 tablet (15 mg total) by mouth daily. (Patient not taking: Reported on 01/09/2022) 30 tablet 0   No current facility-administered medications for this visit.    Allergies: Metronidazole and Mushroom extract complex  Past Medical History:  Diagnosis Date   Allergy    pt. reports seasonal allergies   Headache    Lactose intolerance    Palpitations    Right knee pain    Tinnitus     Past Surgical History:  Procedure Laterality Date   CESAREAN SECTION     WISDOM TOOTH EXTRACTION      Family History  Problem Relation Age of Onset   Diabetes Maternal Grandmother    Hypertension Maternal Grandmother    Hypertension Mother    Hyperlipidemia Mother    Hypertension Father     Social History   Tobacco Use   Smoking status: Never   Smokeless tobacco: Never  Substance Use Topics   Alcohol use: Yes    Comment: social    Subjective:   History of migraine headaches since high school; persisting headache on and off x 1 week- "pressure and throbbing." Has tried OTC medications which help but headache "just won't go away." Would like Toradol injection- has done well in the past;   LMP- due now/ no protection- admits pregnancy could be possible;  Objective:  Vitals:   01/09/22 0953  BP: 130/80  Pulse: 85  Temp: 98.2 F (36.8 C)  TempSrc: Oral  SpO2: 99%  Weight: 224 lb (101.6 kg)  Height: '5\' 6"'$  (1.676 m)    General: Well developed, well nourished, in no acute distress  Skin : Warm and dry.  Head: Normocephalic and atraumatic  Lungs: Respirations unlabored;  Neurologic: Alert and oriented; speech intact; face symmetrical; moves all extremities well; CNII-XII intact without focal deficit   Assessment:  1. Nonintractable headache, unspecified chronicity pattern, unspecified headache type   2.  Irregular menses     Plan:  Pregnancy test negative; okay to give Toradol IM 60 mg- patient tolerated well; follow up if headache persists/ returns;   No follow-ups on file.  Orders Placed This Encounter  Procedures   POCT urine pregnancy    Requested Prescriptions    No prescriptions requested or ordered in this encounter

## 2022-01-31 ENCOUNTER — Other Ambulatory Visit (HOSPITAL_BASED_OUTPATIENT_CLINIC_OR_DEPARTMENT_OTHER): Payer: Self-pay

## 2022-02-01 ENCOUNTER — Other Ambulatory Visit (HOSPITAL_BASED_OUTPATIENT_CLINIC_OR_DEPARTMENT_OTHER): Payer: Self-pay

## 2022-02-02 ENCOUNTER — Other Ambulatory Visit (HOSPITAL_BASED_OUTPATIENT_CLINIC_OR_DEPARTMENT_OTHER): Payer: Self-pay

## 2022-02-12 ENCOUNTER — Other Ambulatory Visit (HOSPITAL_BASED_OUTPATIENT_CLINIC_OR_DEPARTMENT_OTHER): Payer: Self-pay

## 2022-03-07 ENCOUNTER — Other Ambulatory Visit (HOSPITAL_BASED_OUTPATIENT_CLINIC_OR_DEPARTMENT_OTHER): Payer: Self-pay

## 2022-03-07 ENCOUNTER — Other Ambulatory Visit: Payer: Self-pay | Admitting: Family Medicine

## 2022-03-07 DIAGNOSIS — J302 Other seasonal allergic rhinitis: Secondary | ICD-10-CM

## 2022-03-07 MED ORDER — LEVOCETIRIZINE DIHYDROCHLORIDE 5 MG PO TABS
5.0000 mg | ORAL_TABLET | Freq: Two times a day (BID) | ORAL | 3 refills | Status: DC
  Filled 2022-03-14: qty 180, 90d supply, fill #0
  Filled 2022-04-15 – 2022-06-22 (×2): qty 180, 90d supply, fill #1

## 2022-03-14 ENCOUNTER — Other Ambulatory Visit (HOSPITAL_BASED_OUTPATIENT_CLINIC_OR_DEPARTMENT_OTHER): Payer: Self-pay

## 2022-04-09 ENCOUNTER — Other Ambulatory Visit (HOSPITAL_BASED_OUTPATIENT_CLINIC_OR_DEPARTMENT_OTHER): Payer: Self-pay

## 2022-04-16 ENCOUNTER — Other Ambulatory Visit (HOSPITAL_BASED_OUTPATIENT_CLINIC_OR_DEPARTMENT_OTHER): Payer: Self-pay

## 2022-05-21 ENCOUNTER — Other Ambulatory Visit (HOSPITAL_BASED_OUTPATIENT_CLINIC_OR_DEPARTMENT_OTHER): Payer: Self-pay

## 2022-06-14 ENCOUNTER — Encounter: Payer: Commercial Managed Care - HMO | Admitting: Family Medicine

## 2022-06-22 ENCOUNTER — Emergency Department (HOSPITAL_BASED_OUTPATIENT_CLINIC_OR_DEPARTMENT_OTHER)
Admission: EM | Admit: 2022-06-22 | Discharge: 2022-06-22 | Disposition: A | Discharge status: Home / Self Care | Payer: Commercial Managed Care - HMO | Attending: Emergency Medicine | Admitting: Emergency Medicine

## 2022-06-22 ENCOUNTER — Other Ambulatory Visit: Payer: Self-pay

## 2022-06-22 ENCOUNTER — Other Ambulatory Visit (HOSPITAL_BASED_OUTPATIENT_CLINIC_OR_DEPARTMENT_OTHER): Payer: Self-pay

## 2022-06-22 ENCOUNTER — Other Ambulatory Visit: Payer: Self-pay | Admitting: Family Medicine

## 2022-06-22 ENCOUNTER — Emergency Department (HOSPITAL_BASED_OUTPATIENT_CLINIC_OR_DEPARTMENT_OTHER): Payer: Commercial Managed Care - HMO

## 2022-06-22 DIAGNOSIS — E119 Type 2 diabetes mellitus without complications: Secondary | ICD-10-CM | POA: Insufficient documentation

## 2022-06-22 DIAGNOSIS — J302 Other seasonal allergic rhinitis: Secondary | ICD-10-CM

## 2022-06-22 DIAGNOSIS — U071 COVID-19: Secondary | ICD-10-CM | POA: Diagnosis not present

## 2022-06-22 DIAGNOSIS — R519 Headache, unspecified: Secondary | ICD-10-CM | POA: Diagnosis present

## 2022-06-22 LAB — RESP PANEL BY RT-PCR (RSV, FLU A&B, COVID)  RVPGX2
Influenza A by PCR: NEGATIVE
Influenza B by PCR: NEGATIVE
Resp Syncytial Virus by PCR: NEGATIVE
SARS Coronavirus 2 by RT PCR: POSITIVE — AB

## 2022-06-22 NOTE — ED Provider Notes (Signed)
[facility] Provider Note   CSN: [id number] Arrival date & time: 06/22/22  1640     History {Add pertinent medical, surgical, social history, OB history to HPI:1} Chief Complaint  Patient presents with   Generalized Body Aches    Katherine Mcfarland is a 37 y.o. female.  HPI     Home Medications Prior to Admission medications   Medication Sig Start Date End Date Taking? Authorizing Provider  cyclobenzaprine (FLEXERIL) 10 MG tablet Take 1 tablet (10 mg total) by mouth 3 (three) times daily as needed for muscle spasms. Patient not taking: Reported on 01/09/2022 06/15/21   Carollee Herter, Alferd Apa, DO  famotidine (PEPCID) 40 MG tablet TAKE 1 TABLET (40 MG TOTAL) BY MOUTH DAILY. 12/12/21 12/12/22  Ann Held, DO  fluconazole (DIFLUCAN) 150 MG tablet Take 1 tablet by mouth once. May repeat in 3 days if needed 09/12/21   Carollee Herter, Alferd Apa, DO  levocetirizine (XYZAL) 5 MG tablet Take 1 tablet (5 mg total) by mouth 2 (two) times daily. 03/07/22   Ann Held, DO  meloxicam (MOBIC) 15 MG tablet Take 1 tablet (15 mg total) by mouth daily. Patient not taking: Reported on 01/09/2022 06/15/21   Roma Schanz R, DO  metoprolol succinate (TOPROL-XL) 25 MG 24 hr tablet Take 1 tablet (25 mg total) by mouth every evening. 12/12/21   Carollee Herter, Yvonne R, DO  montelukast (SINGULAIR) 10 MG tablet TAKE 1 TABLET (10 MG TOTAL) BY MOUTH AT BEDTIME. 12/12/21 12/12/22  Ann Held, DO  Prenatal Vit-Fe Fumarate-FA (MULTIVITAMIN-PRENATAL) 27-0.8 MG TABS tablet Take 1 tablet by mouth daily. 07/17/21   Ann Held, DO  tretinoin (RETIN-A) 0.025 % cream Apply topically at bedtime. 01/24/21   Ann Held, DO  Vitamin D, Ergocalciferol, (DRISDOL) 1.25 MG (50000 UNIT) CAPS capsule Take 1 capsule (50,000 Units total) by mouth once a week. 12/12/21 12/12/22  Ann Held, DO      Allergies    Metronidazole and Mushroom  extract complex    Review of Systems   Review of Systems  Physical Exam Updated Vital Signs BP (!) 152/105 (BP Location: Left Arm)   Pulse 88   Temp 99.3 F (37.4 C) (Oral)   Resp 16   Ht 5' 6"$  (1.676 m)   Wt 96.2 kg   SpO2 100%   BMI 34.22 kg/m  Physical Exam  ED Results / Procedures / Treatments   Labs (all labs ordered are listed, but only abnormal results are displayed) Labs Reviewed  RESP PANEL BY RT-PCR (RSV, FLU A&B, COVID)  RVPGX2 - Abnormal; Notable for the following components:      Result Value   SARS Coronavirus 2 by RT PCR POSITIVE (*)    All other components within normal limits    EKG None  Radiology No results found.  Procedures Procedures  {Document cardiac monitor, telemetry assessment procedure when appropriate:1}  Medications Ordered in ED Medications - No data to display  ED Course/ Medical Decision Making/ A&P   {   Click here for ABCD2, HEART and other calculatorsREFRESH Note before signing :1}                          Medical Decision Making  ***  {Document critical care time when appropriate:1} {Document review of labs and clinical decision tools ie heart score, Chads2Vasc2 etc:1}  {Document  your independent review of radiology images, and any outside records:1} {Document your discussion with family members, caretakers, and with consultants:1} {Document social determinants of health affecting pt's care:1} {Document your decision making why or why not admission, treatments were needed:1} Final Clinical Impression(s) / ED Diagnoses Final diagnoses:  None    Rx / DC Orders ED Discharge Orders     None

## 2022-06-22 NOTE — ED Triage Notes (Signed)
Patient presents to ED via POV from home. Here with generalized body aches and "not feeling well". Reports her coworker recently tested positive for COVID.

## 2022-06-22 NOTE — Discharge Instructions (Addendum)
You tested positive for COVID-19.  Please quarantine until day 5 of your symptoms.  After this, please wear a mask in public for the next 5 days or until symptom-free for at least 24 hours.  Viral Illness TREATMENT  Treatment is directed at relieving symptoms. There is no cure. Antibiotics are not effective because the infection is caused by a virus, not by bacteria. Treatment may include:  Increased fluid intake. Sports drinks offer valuable electrolytes, sugars, and fluids.  Breathing heated mist or steam (vaporizer or shower).  Eating chicken soup or other clear broths, and maintaining good nutrition.  Getting plenty of rest.  Using gargles or lozenges for comfort.  Increasing usage of your inhaler if you have asthma.  Return to work when your temperature has returned to normal.  Gargle warm salt water and spit it out for sore throat. Take benadryl to decrease sinus secretions. Continue to alternate between Tylenol and ibuprofen for pain and fever control.  Follow Up: Follow up with your primary care doctor in 5-7 days for recheck of ongoing symptoms.  Return to emergency department for emergent changing or worsening of symptoms.

## 2022-06-25 ENCOUNTER — Other Ambulatory Visit (HOSPITAL_BASED_OUTPATIENT_CLINIC_OR_DEPARTMENT_OTHER): Payer: Self-pay

## 2022-06-25 ENCOUNTER — Encounter: Payer: Commercial Managed Care - HMO | Admitting: Family Medicine

## 2022-06-25 ENCOUNTER — Telehealth: Payer: Self-pay

## 2022-06-25 MED ORDER — MONTELUKAST SODIUM 10 MG PO TABS
10.0000 mg | ORAL_TABLET | Freq: Every day | ORAL | 3 refills | Status: DC
  Filled 2022-06-25: qty 90, 90d supply, fill #0

## 2022-06-25 NOTE — Transitions of Care (Post Inpatient/ED Visit) (Signed)
   06/25/2022  Name: JASEMINE NAWAZ MRN: [id number] DOB: 01/02/1986  Today's TOC FU Call Status: Today's TOC FU Call Status:: Successful TOC FU Call Competed TOC FU Call Complete Date: 06/25/22  Transition Care Management Follow-up Telephone Call Date of Discharge: 06/22/22 Discharge Facility: [facility] Type of Discharge: Emergency Department Reason for ED Visit: Other: (COVID) How have you been since you were released from the hospital?: Better Any questions or concerns?: No  Items Reviewed: Did you receive and understand the discharge instructions provided?: Yes Medications obtained and verified?: Yes (Medications Reviewed) Any new allergies since your discharge?: No Dietary orders reviewed?: No Do you have support at home?: Yes People in Home: spouse  Home Care and Equipment/Supplies: [place] Ordered?: No Any new equipment or medical supplies ordered?: No  Functional Questionnaire: Do you need assistance with bathing/showering or dressing?: No Do you need assistance with meal preparation?: No Do you need assistance with eating?: No Do you have difficulty maintaining continence: No Do you need assistance with getting out of bed/getting out of a chair/moving?: No Do you have difficulty managing or taking your medications?: No  Folllow up appointments reviewed: PCP Follow-up appointment confirmed?: No (Declines appt at this time) MD Provider Line Number:[phone] Given: Yes [facility] Follow-up appointment confirmed?: NA Do you need transportation to your follow-up appointment?: No Do you understand care options if your condition(s) worsen?: Yes-patient verbalized understanding    SIGNATURE. Gerilyn Nestle

## 2022-07-02 ENCOUNTER — Other Ambulatory Visit (HOSPITAL_BASED_OUTPATIENT_CLINIC_OR_DEPARTMENT_OTHER): Payer: Self-pay

## 2022-07-19 ENCOUNTER — Other Ambulatory Visit (HOSPITAL_BASED_OUTPATIENT_CLINIC_OR_DEPARTMENT_OTHER): Payer: Self-pay

## 2022-07-19 ENCOUNTER — Ambulatory Visit (INDEPENDENT_AMBULATORY_CARE_PROVIDER_SITE_OTHER): Payer: Commercial Managed Care - HMO | Admitting: Family Medicine

## 2022-07-19 ENCOUNTER — Encounter: Payer: Self-pay | Admitting: Family Medicine

## 2022-07-19 VITALS — BP 120/80 | HR 84 | Temp 98.3°F | Resp 18 | Ht 66.0 in | Wt 221.0 lb

## 2022-07-19 DIAGNOSIS — Z0001 Encounter for general adult medical examination with abnormal findings: Secondary | ICD-10-CM

## 2022-07-19 DIAGNOSIS — K219 Gastro-esophageal reflux disease without esophagitis: Secondary | ICD-10-CM

## 2022-07-19 DIAGNOSIS — E1165 Type 2 diabetes mellitus with hyperglycemia: Secondary | ICD-10-CM | POA: Diagnosis not present

## 2022-07-19 DIAGNOSIS — I471 Supraventricular tachycardia, unspecified: Secondary | ICD-10-CM

## 2022-07-19 DIAGNOSIS — J302 Other seasonal allergic rhinitis: Secondary | ICD-10-CM | POA: Diagnosis not present

## 2022-07-19 DIAGNOSIS — I10 Essential (primary) hypertension: Secondary | ICD-10-CM

## 2022-07-19 DIAGNOSIS — Z0000 Encounter for general adult medical examination without abnormal findings: Secondary | ICD-10-CM

## 2022-07-19 DIAGNOSIS — E559 Vitamin D deficiency, unspecified: Secondary | ICD-10-CM

## 2022-07-19 DIAGNOSIS — S161XXA Strain of muscle, fascia and tendon at neck level, initial encounter: Secondary | ICD-10-CM

## 2022-07-19 DIAGNOSIS — R739 Hyperglycemia, unspecified: Secondary | ICD-10-CM

## 2022-07-19 DIAGNOSIS — R7303 Prediabetes: Secondary | ICD-10-CM

## 2022-07-19 DIAGNOSIS — E7849 Other hyperlipidemia: Secondary | ICD-10-CM

## 2022-07-19 MED ORDER — VITAMIN D (ERGOCALCIFEROL) 1.25 MG (50000 UNIT) PO CAPS
50000.0000 [IU] | ORAL_CAPSULE | ORAL | 2 refills | Status: DC
  Filled 2022-07-19 – 2022-09-18 (×2): qty 12, 84d supply, fill #0
  Filled 2023-03-20: qty 12, 84d supply, fill #1
  Filled 2023-06-24: qty 12, 84d supply, fill #2

## 2022-07-19 MED ORDER — METOPROLOL SUCCINATE ER 25 MG PO TB24
25.0000 mg | ORAL_TABLET | Freq: Every evening | ORAL | 3 refills | Status: DC
  Filled 2022-07-19 – 2022-09-18 (×2): qty 90, 90d supply, fill #0
  Filled 2022-12-31: qty 90, 90d supply, fill #1
  Filled 2023-03-20: qty 90, 90d supply, fill #2
  Filled 2023-06-24: qty 90, 90d supply, fill #3

## 2022-07-19 MED ORDER — FAMOTIDINE 40 MG PO TABS
40.0000 mg | ORAL_TABLET | Freq: Every day | ORAL | 3 refills | Status: DC
  Filled 2022-07-19 – 2022-10-22 (×4): qty 90, 90d supply, fill #0
  Filled 2023-03-20: qty 90, 90d supply, fill #1
  Filled 2023-06-24: qty 90, 90d supply, fill #2

## 2022-07-19 MED ORDER — MONTELUKAST SODIUM 10 MG PO TABS
10.0000 mg | ORAL_TABLET | Freq: Every day | ORAL | 3 refills | Status: DC
  Filled 2022-07-19 – 2022-09-18 (×2): qty 90, 90d supply, fill #0
  Filled 2023-03-20: qty 90, 90d supply, fill #1
  Filled 2023-06-24: qty 90, 90d supply, fill #2

## 2022-07-19 NOTE — Assessment & Plan Note (Signed)
Ghm utd Check labs  See AVS  Health Maintenance Due  Topic Date Due   COVID-19 Vaccine (1) Never done   FOOT EXAM  Never done   OPHTHALMOLOGY EXAM  Never done   Diabetic kidney evaluation - Urine ACR  Never done   PAP SMEAR-Modifier  10/23/2020   HEMOGLOBIN A1C  12/13/2021   Diabetic kidney evaluation - eGFR measurement  06/15/2022

## 2022-07-19 NOTE — Assessment & Plan Note (Signed)
Stable  Con't pepcid

## 2022-07-19 NOTE — Assessment & Plan Note (Signed)
Check labs  Con't diet

## 2022-07-19 NOTE — Assessment & Plan Note (Signed)
Encourage heart healthy diet such as MIND or DASH diet, increase exercise, avoid trans fats, simple carbohydrates and processed foods, consider a krill or fish or flaxseed oil cap daily.  °

## 2022-07-19 NOTE — Progress Notes (Signed)
Subjective:   By signing my name below, I, Katherine Mcfarland, attest that this documentation has been prepared under the direction and in the presence of Roma Schanz R, DO. 07/19/2022.   Patient ID: Katherine Mcfarland, female    DOB: [birth year], 37 y.o.   MRN: [id number]  Chief Complaint  Patient presents with   Annual Exam    HPI Patient is in today for a complete physical exam.  Refills: She is requesting a refill for Metoprolol, Vitamin D, Singulair, and Pepcid.  Blood pressure: Generally she is feeling okay today. Her blood pressure is stable in clinic. BP Readings from Last 3 Encounters:  07/19/22 120/80  06/22/22 (!) 142/98  01/09/22 130/80   Palpitations: Every now and then she still feels rapid palpitations. These have been managed with her metoprolol.  Neck stiffness: She complains of ongoing neck stiffness. She has been following with a chiropractor for 6 months with limited relief.  Weight loss: She continues to work on weight loss. Lately her weight has been stable around 220 lbs. She reports losing some weight while she was sick, initially 2 weeks prior to her positive COVID infection. She is also wearing abdominal compression. Wt Readings from Last 3 Encounters:  07/19/22 221 lb (100.2 kg)  06/22/22 212 lb (96.2 kg)  01/09/22 224 lb (101.6 kg)   COVID-19: She had a Covid infection 06/22/22 and appears to have recovered well.  Social history:  She denies any recent changes to her family history.  Pap Smear:  Last completed 10/23/2017. She will schedule this at a later date. Mammogram:  Last completed 05/20/2019. Immunizations: Influenza vaccine last received 02/11/2019.  Tdap last received 11/29/2015. Exercise: Every day she is walking up 2 flights of stairs multiple times a day. Vision: She reports that she only needs to wear her glasses if she is driving, or at night.  Denies having any fever, new muscle pain, joint pain, new moles, congestion, sinus pain,  sore throat, chest pain, cough, SOB, wheezing, n/v/d, constipation, blood in stool, dysuria, frequency, hematuria, at this time.  Past Medical History:  Diagnosis Date   Allergy    pt. reports seasonal allergies   Headache    Lactose intolerance    Palpitations    Right knee pain    Tinnitus     Past Surgical History:  Procedure Laterality Date   CESAREAN SECTION     WISDOM TOOTH EXTRACTION      Family History  Problem Relation Age of Onset   Diabetes Maternal Grandmother    Hypertension Maternal Grandmother    Hypertension Mother    Hyperlipidemia Mother    Hypertension Father     Social History   Socioeconomic History   Marital status: Single    Spouse name: Not on file   Number of children: Not on file   Years of education: Not on file   Highest education level: Not on file  Occupational History   Occupation: CMA    Employer: [employer]  Tobacco Use   Smoking status: Never   Smokeless tobacco: Never  Vaping Use   Vaping Use: Never used  Substance and Sexual Activity   Alcohol use: Yes    Comment: social   Drug use: No   Sexual activity: Yes    Birth control/protection: None  Other Topics Concern   Not on file  Social History Narrative   Not on file   Social Determinants of Health   Financial Resource Strain: Not on  file  Food Insecurity: Not on file  Transportation Needs: Not on file  Physical Activity: Not on file  Stress: Not on file  Social Connections: Not on file  Intimate Partner Violence: Not on file    Outpatient Medications Prior to Visit  Medication Sig Dispense Refill   fluconazole (DIFLUCAN) 150 MG tablet Take 1 tablet by mouth once. May repeat in 3 days if needed 12 tablet 3   levocetirizine (XYZAL) 5 MG tablet Take 1 tablet (5 mg total) by mouth 2 (two) times daily. 180 tablet 3   Prenatal Vit-Fe Fumarate-FA (MULTIVITAMIN-PRENATAL) 27-0.8 MG TABS tablet Take 1 tablet by mouth daily. 90 tablet 3   tretinoin (RETIN-A) 0.025 %  cream Apply topically at bedtime. 45 g 3   cyclobenzaprine (FLEXERIL) 10 MG tablet Take 1 tablet (10 mg total) by mouth 3 (three) times daily as needed for muscle spasms. (Patient not taking: Reported on 01/09/2022) 30 tablet 0   famotidine (PEPCID) 40 MG tablet TAKE 1 TABLET (40 MG TOTAL) BY MOUTH DAILY. 90 tablet 3   meloxicam (MOBIC) 15 MG tablet Take 1 tablet (15 mg total) by mouth daily. (Patient not taking: Reported on 01/09/2022) 30 tablet 0   metoprolol succinate (TOPROL-XL) 25 MG 24 hr tablet Take 1 tablet (25 mg total) by mouth every evening. 90 tablet 3   montelukast (SINGULAIR) 10 MG tablet Take 1 tablet (10 mg total) by mouth at bedtime. 90 tablet 3   Vitamin D, Ergocalciferol, (DRISDOL) 1.25 MG (50000 UNIT) CAPS capsule Take 1 capsule (50,000 Units total) by mouth once a week. 12 capsule 2   No facility-administered medications prior to visit.    Allergies  Allergen Reactions   Metronidazole Nausea And Vomiting   Mushroom Extract Complex Diarrhea    Review of Systems  Constitutional:  Negative for fever and malaise/fatigue.  HENT:  Negative for congestion, sinus pain and sore throat.   Eyes:  Negative for blurred vision.  Respiratory:  Negative for cough, shortness of breath and wheezing.   Cardiovascular:  Positive for palpitations (Intermittent). Negative for chest pain and leg swelling.  Gastrointestinal:  Negative for abdominal pain, blood in stool, constipation, diarrhea, nausea and vomiting.  Genitourinary:  Negative for dysuria, frequency and hematuria.  Musculoskeletal:  Positive for neck pain (Stiffness). Negative for falls, joint pain and myalgias.  Skin:  Negative for rash.  Neurological:  Negative for dizziness, loss of consciousness and headaches.  Endo/Heme/Allergies:  Negative for environmental allergies.  Psychiatric/Behavioral:  Negative for depression. The patient is not nervous/anxious.        Objective:    Physical Exam Vitals and nursing note  reviewed.  Constitutional:      Appearance: Normal appearance.  HENT:     Head: Normocephalic and atraumatic.     Right Ear: Tympanic membrane, ear canal and external ear normal.     Left Ear: Tympanic membrane, ear canal and external ear normal.  Eyes:     Extraocular Movements: Extraocular movements intact.     Pupils: Pupils are equal, round, and reactive to light.  Cardiovascular:     Rate and Rhythm: Normal rate and regular rhythm.     Heart sounds: Normal heart sounds. No murmur heard.    No gallop.  Pulmonary:     Effort: Pulmonary effort is normal. No respiratory distress.     Breath sounds: Normal breath sounds. No wheezing or rales.  Abdominal:     Comments: Not evaluated due to abdominal compression in place.  Musculoskeletal:        General: Normal range of motion.  Skin:    General: Skin is warm and dry.  Neurological:     General: No focal deficit present.     Mental Status: She is alert and oriented to person, place, and time.  Psychiatric:        Mood and Affect: Mood normal.        Behavior: Behavior normal.     BP 120/80 (BP Location: Left Arm, Patient Position: Sitting, Cuff Size: Large)   Pulse 84   Temp 98.3 F (36.8 C) (Oral)   Resp 18   Ht '5\' 6"'$  (1.676 m)   Wt 221 lb (100.2 kg)   LMP 07/04/2022   SpO2 98%   BMI 35.67 kg/m  Wt Readings from Last 3 Encounters:  07/19/22 221 lb (100.2 kg)  06/22/22 212 lb (96.2 kg)  01/09/22 224 lb (101.6 kg)    Diabetic Foot Exam - Simple   No data filed    Lab Results  Component Value Date   WBC 6.6 06/15/2021   HGB 11.7 (L) 06/15/2021   HCT 36.2 06/15/2021   PLT 352.0 06/15/2021   GLUCOSE 99 06/15/2021   CHOL 167 06/15/2021   TRIG 130.0 06/15/2021   HDL 45.40 06/15/2021   LDLCALC 96 06/15/2021   ALT 9 06/15/2021   AST 13 06/15/2021   NA 137 06/15/2021   K 4.5 06/15/2021   CL 103 06/15/2021   CREATININE 0.90 06/15/2021   BUN 12 06/15/2021   CO2 30 06/15/2021   TSH 0.95 06/15/2021   HGBA1C  5.7 06/15/2021    Lab Results  Component Value Date   TSH 0.95 06/15/2021   Lab Results  Component Value Date   WBC 6.6 06/15/2021   HGB 11.7 (L) 06/15/2021   HCT 36.2 06/15/2021   MCV 84.9 06/15/2021   PLT 352.0 06/15/2021   Lab Results  Component Value Date   NA 137 06/15/2021   K 4.5 06/15/2021   CO2 30 06/15/2021   GLUCOSE 99 06/15/2021   BUN 12 06/15/2021   CREATININE 0.90 06/15/2021   BILITOT 0.2 06/15/2021   ALKPHOS 55 06/15/2021   AST 13 06/15/2021   ALT 9 06/15/2021   PROT 7.0 06/15/2021   ALBUMIN 4.1 06/15/2021   CALCIUM 9.4 06/15/2021   GFR 81.42 06/15/2021   Lab Results  Component Value Date   CHOL 167 06/15/2021   Lab Results  Component Value Date   HDL 45.40 06/15/2021   Lab Results  Component Value Date   LDLCALC 96 06/15/2021   Lab Results  Component Value Date   TRIG 130.0 06/15/2021   Lab Results  Component Value Date   CHOLHDL 4 06/15/2021   Lab Results  Component Value Date   HGBA1C 5.7 06/15/2021       Assessment & Plan:   Problem List Items Addressed This Visit       Unprioritized   Vitamin D deficiency   Relevant Medications   Vitamin D, Ergocalciferol, (DRISDOL) 1.25 MG (50000 UNIT) CAPS capsule   Other Relevant Orders   VITAMIN D 25 Hydroxy (Vit-D Deficiency, Fractures)   Uncontrolled type 2 diabetes mellitus with hyperglycemia (HCC)   Seasonal allergies   Relevant Medications   montelukast (SINGULAIR) 10 MG tablet   PSVT (paroxysmal supraventricular tachycardia)   Relevant Medications   metoprolol succinate (TOPROL-XL) 25 MG 24 hr tablet   Primary hypertension    Well controlled, no changes to meds. Encouraged  heart healthy diet such as the DASH diet and exercise as tolerated.        Relevant Medications   metoprolol succinate (TOPROL-XL) 25 MG 24 hr tablet   Preventative health care - Primary    Ghm utd Check labs  See AVS  Health Maintenance Due  Topic Date Due   COVID-19 Vaccine (1) Never done    FOOT EXAM  Never done   OPHTHALMOLOGY EXAM  Never done   Diabetic kidney evaluation - Urine ACR  Never done   PAP SMEAR-Modifier  10/23/2020   HEMOGLOBIN A1C  12/13/2021   Diabetic kidney evaluation - eGFR measurement  06/15/2022        Relevant Orders   CBC with Differential/Platelet   Comprehensive metabolic panel   Lipid panel   TSH   Prediabetes    Check labs  Con't diet       Other hyperlipidemia    Encourage heart healthy diet such as MIND or DASH diet, increase exercise, avoid trans fats, simple carbohydrates and processed foods, consider a krill or fish or flaxseed oil cap daily.        Relevant Medications   metoprolol succinate (TOPROL-XL) 25 MG 24 hr tablet   Morbid obesity (HCC)    Con't diet and exercise       Relevant Orders   Vitamin B12   VITAMIN D 25 Hydroxy (Vit-D Deficiency, Fractures)   Insulin, random   Gastroesophageal reflux disease without esophagitis    Stable  Con't pepcid       Relevant Medications   famotidine (PEPCID) 40 MG tablet   Other Visit Diagnoses     Hyperglycemia       Relevant Orders   Hemoglobin A1c   Strain of neck muscle, initial encounter       Relevant Orders   Ambulatory referral to Chiropractic        Meds ordered this encounter  Medications   famotidine (PEPCID) 40 MG tablet    Sig: Take 1 tablet (40 mg total) by mouth daily.    Dispense:  90 tablet    Refill:  3   metoprolol succinate (TOPROL-XL) 25 MG 24 hr tablet    Sig: Take 1 tablet (25 mg total) by mouth every evening.    Dispense:  90 tablet    Refill:  3   montelukast (SINGULAIR) 10 MG tablet    Sig: Take 1 tablet (10 mg total) by mouth at bedtime.    Dispense:  90 tablet    Refill:  3   Vitamin D, Ergocalciferol, (DRISDOL) 1.25 MG (50000 UNIT) CAPS capsule    Sig: Take 1 capsule (50,000 Units total) by mouth once a week.    Dispense:  12 capsule    Refill:  2    I, Ann Held, DO, personally preformed the services described in  this documentation.  All medical record entries made by the scribe were at my direction and in my presence.  I have reviewed the chart and discharge instructions (if applicable) and agree that the record reflects my personal performance and is accurate and complete.  07/19/2022.  I,Mathew Stumpf,acting as a Education administrator for [employer], DO.,have documented all relevant documentation on the behalf of Ann Held, DO,as directed by  Ann Held, DO while in the presence of Ann Held, DO.   Ann Held, DO

## 2022-07-19 NOTE — Patient Instructions (Signed)

## 2022-07-19 NOTE — Assessment & Plan Note (Signed)
Well controlled, no changes to meds. Encouraged heart healthy diet such as the DASH diet and exercise as tolerated.  °

## 2022-07-19 NOTE — Progress Notes (Signed)
Subjective:   By signing my name below, I, Katherine Mcfarland, attest that this documentation has been prepared under the direction and in the presence of Ann Held, DO. 07/19/2022   Patient ID: Katherine Mcfarland, female    DOB: 11/15/1985, 37 y.o.   MRN: [id number]  No chief complaint on file.   HPI Patient is in today for a comprehensive physical exam.    Social history:   Immunizations:   Diet:   Exercise:   Pap Smear: Last completed on 10/23/2017. Results are normal. Repeat in three years.   Dental:   Vision:    Past Medical History:  Diagnosis Date   Allergy    pt. reports seasonal allergies   Headache    Lactose intolerance    Palpitations    Right knee pain    Tinnitus     Past Surgical History:  Procedure Laterality Date   CESAREAN SECTION     WISDOM TOOTH EXTRACTION      Family History  Problem Relation Age of Onset   Diabetes Maternal Grandmother    Hypertension Maternal Grandmother    Hypertension Mother    Hyperlipidemia Mother    Hypertension Father     Social History   Socioeconomic History   Marital status: Single    Spouse name: Not on file   Number of children: Not on file   Years of education: Not on file   Highest education level: Not on file  Occupational History   Occupation: CMA    Employer: [employer]  Tobacco Use   Smoking status: Never   Smokeless tobacco: Never  Vaping Use   Vaping Use: Never used  Substance and Sexual Activity   Alcohol use: Yes    Comment: social   Drug use: No   Sexual activity: Yes    Birth control/protection: None  Other Topics Concern   Not on file  Social History Narrative   Not on file   Social Determinants of Health   Financial Resource Strain: Not on file  Food Insecurity: Not on file  Transportation Needs: Not on file  Physical Activity: Not on file  Stress: Not on file  Social Connections: Not on file  Intimate Partner Violence: Not on file    Outpatient  Medications Prior to Visit  Medication Sig Dispense Refill   cyclobenzaprine (FLEXERIL) 10 MG tablet Take 1 tablet (10 mg total) by mouth 3 (three) times daily as needed for muscle spasms. (Patient not taking: Reported on 01/09/2022) 30 tablet 0   famotidine (PEPCID) 40 MG tablet TAKE 1 TABLET (40 MG TOTAL) BY MOUTH DAILY. 90 tablet 3   fluconazole (DIFLUCAN) 150 MG tablet Take 1 tablet by mouth once. May repeat in 3 days if needed 12 tablet 3   levocetirizine (XYZAL) 5 MG tablet Take 1 tablet (5 mg total) by mouth 2 (two) times daily. 180 tablet 3   meloxicam (MOBIC) 15 MG tablet Take 1 tablet (15 mg total) by mouth daily. (Patient not taking: Reported on 01/09/2022) 30 tablet 0   metoprolol succinate (TOPROL-XL) 25 MG 24 hr tablet Take 1 tablet (25 mg total) by mouth every evening. 90 tablet 3   montelukast (SINGULAIR) 10 MG tablet Take 1 tablet (10 mg total) by mouth at bedtime. 90 tablet 3   Prenatal Vit-Fe Fumarate-FA (MULTIVITAMIN-PRENATAL) 27-0.8 MG TABS tablet Take 1 tablet by mouth daily. 90 tablet 3   tretinoin (RETIN-A) 0.025 % cream Apply topically at bedtime. 45 g  3   Vitamin D, Ergocalciferol, (DRISDOL) 1.25 MG (50000 UNIT) CAPS capsule Take 1 capsule (50,000 Units total) by mouth once a week. 12 capsule 2   No facility-administered medications prior to visit.    Allergies  Allergen Reactions   Metronidazole Nausea And Vomiting   Mushroom Extract Complex Diarrhea    ROS     Objective:    Physical Exam Constitutional:      General: She is not in acute distress.    Appearance: Normal appearance.  HENT:     Head: Normocephalic.     Right Ear: Tympanic membrane, ear canal and external ear normal.     Left Ear: Tympanic membrane, ear canal and external ear normal.  Eyes:     Extraocular Movements: Extraocular movements intact.     Right eye: No nystagmus.     Left eye: No nystagmus.     Pupils: Pupils are equal, round, and reactive to light.  Cardiovascular:     Rate  and Rhythm: Normal rate and regular rhythm.     Heart sounds: Normal heart sounds. No murmur heard.    No gallop.  Pulmonary:     Effort: Pulmonary effort is normal. No respiratory distress.     Breath sounds: Normal breath sounds. No wheezing or rales.  Abdominal:     General: Bowel sounds are normal. There is no distension.     Tenderness: There is no abdominal tenderness. There is no guarding.  Musculoskeletal:     Cervical back: Normal range of motion.     Comments: (+) 5/5 strength in upper and lower extremities  Lymphadenopathy:     Cervical: No cervical adenopathy.  Skin:    General: Skin is warm.  Neurological:     Mental Status: She is alert and oriented to person, place, and time.     Deep Tendon Reflexes:     Reflex Scores:      Patellar reflexes are 2+ on the right side and 2+ on the left side. Psychiatric:        Judgment: Judgment normal.     There were no vitals taken for this visit. Wt Readings from Last 3 Encounters:  06/22/22 212 lb (96.2 kg)  01/09/22 224 lb (101.6 kg)  12/12/21 227 lb 6.4 oz (103.1 kg)       Assessment & Plan:  There are no diagnoses linked to this encounter.  I, Katherine Mcfarland, personally preformed the services described in this documentation.  All medical record entries made by the scribe were at my direction and in my presence.  I have reviewed the chart and discharge instructions (if applicable) and agree that the record reflects my personal performance and is accurate and complete. 07/19/2022  Katherine Mcfarland   Lacretia Leigh as a scribe for Ann Held, DO.,have documented all relevant documentation on the behalf of Ann Held, DO,as directed by  Ann Held, DO while in the presence of Ann Held, DO.

## 2022-07-19 NOTE — Assessment & Plan Note (Signed)
Con't diet and exercise

## 2022-07-20 LAB — CBC WITH DIFFERENTIAL/PLATELET
Basophils Absolute: 0.1 10*3/uL (ref 0.0–0.1)
Basophils Relative: 1.1 % (ref 0.0–3.0)
Eosinophils Absolute: 0.2 10*3/uL (ref 0.0–0.7)
Eosinophils Relative: 3.7 % (ref 0.0–5.0)
HCT: 28.2 % — ABNORMAL LOW (ref 36.0–46.0)
Hemoglobin: 8.8 g/dL — ABNORMAL LOW (ref 12.0–15.0)
Lymphocytes Relative: 40.3 % (ref 12.0–46.0)
Lymphs Abs: 2.4 10*3/uL (ref 0.7–4.0)
MCHC: 31.2 g/dL (ref 30.0–36.0)
MCV: 70 fl — ABNORMAL LOW (ref 78.0–100.0)
Monocytes Absolute: 0.5 10*3/uL (ref 0.1–1.0)
Monocytes Relative: 7.9 % (ref 3.0–12.0)
Neutro Abs: 2.8 10*3/uL (ref 1.4–7.7)
Neutrophils Relative %: 47 % (ref 43.0–77.0)
Platelets: 481 10*3/uL — ABNORMAL HIGH (ref 150.0–400.0)
RBC: 4.03 Mil/uL (ref 3.87–5.11)
RDW: 19.2 % — ABNORMAL HIGH (ref 11.5–15.5)
WBC: 5.9 10*3/uL (ref 4.0–10.5)

## 2022-07-20 LAB — LIPID PANEL
Cholesterol: 169 mg/dL (ref 0–200)
HDL: 43.7 mg/dL (ref >39.00)
LDL Cholesterol: 99 mg/dL (ref 0–99)
NonHDL: 124.93
Total CHOL/HDL Ratio: 4
Triglycerides: 131 mg/dL (ref 0.0–149.0)
VLDL: 26.2 mg/dL (ref 0.0–40.0)

## 2022-07-20 LAB — COMPREHENSIVE METABOLIC PANEL
ALT: 11 U/L (ref 0–35)
AST: 14 U/L (ref 0–37)
Albumin: 3.9 g/dL (ref 3.5–5.2)
Alkaline Phosphatase: 57 U/L (ref 39–117)
BUN: 15 mg/dL (ref 6–23)
CO2: 26 mEq/L (ref 19–32)
Calcium: 9.2 mg/dL (ref 8.4–10.5)
Chloride: 105 mEq/L (ref 96–112)
Creatinine, Ser: 0.87 mg/dL (ref 0.40–1.20)
GFR: 84.15 mL/min (ref >60.00)
Glucose, Bld: 89 mg/dL (ref 70–99)
Potassium: 4.5 mEq/L (ref 3.5–5.1)
Sodium: 137 mEq/L (ref 135–145)
Total Bilirubin: 0.2 mg/dL (ref 0.2–1.2)
Total Protein: 6.8 g/dL (ref 6.0–8.3)

## 2022-07-20 LAB — INSULIN, RANDOM: Insulin: 13.2 u[IU]/mL

## 2022-07-20 LAB — HEMOGLOBIN A1C: Hgb A1c MFr Bld: 5.9 % (ref 4.6–6.5)

## 2022-07-21 LAB — VITAMIN D 25 HYDROXY (VIT D DEFICIENCY, FRACTURES): VITD: 19.17 ng/mL — ABNORMAL LOW (ref 30.00–100.00)

## 2022-07-21 LAB — TSH: TSH: 1.24 u[IU]/mL (ref 0.35–5.50)

## 2022-07-21 LAB — VITAMIN B12: Vitamin B-12: 375 pg/mL (ref 211–911)

## 2022-07-26 ENCOUNTER — Other Ambulatory Visit (HOSPITAL_BASED_OUTPATIENT_CLINIC_OR_DEPARTMENT_OTHER): Payer: Self-pay

## 2022-09-18 ENCOUNTER — Other Ambulatory Visit (HOSPITAL_BASED_OUTPATIENT_CLINIC_OR_DEPARTMENT_OTHER): Payer: Self-pay

## 2022-09-18 ENCOUNTER — Other Ambulatory Visit: Payer: Self-pay | Admitting: Family Medicine

## 2022-09-18 ENCOUNTER — Other Ambulatory Visit: Payer: Self-pay

## 2022-09-18 MED ORDER — FLUCONAZOLE 150 MG PO TABS
ORAL_TABLET | ORAL | 3 refills | Status: DC
  Filled 2022-09-18: qty 12, 30d supply, fill #0

## 2022-10-01 ENCOUNTER — Other Ambulatory Visit (HOSPITAL_BASED_OUTPATIENT_CLINIC_OR_DEPARTMENT_OTHER): Payer: Self-pay

## 2022-10-15 ENCOUNTER — Other Ambulatory Visit (HOSPITAL_BASED_OUTPATIENT_CLINIC_OR_DEPARTMENT_OTHER): Payer: Self-pay

## 2022-10-22 ENCOUNTER — Other Ambulatory Visit (HOSPITAL_BASED_OUTPATIENT_CLINIC_OR_DEPARTMENT_OTHER): Payer: Self-pay

## 2022-12-31 ENCOUNTER — Other Ambulatory Visit (HOSPITAL_BASED_OUTPATIENT_CLINIC_OR_DEPARTMENT_OTHER): Payer: Self-pay

## 2023-03-20 ENCOUNTER — Other Ambulatory Visit: Payer: Self-pay | Admitting: Family Medicine

## 2023-03-20 ENCOUNTER — Other Ambulatory Visit (HOSPITAL_BASED_OUTPATIENT_CLINIC_OR_DEPARTMENT_OTHER): Payer: Self-pay

## 2023-03-20 ENCOUNTER — Other Ambulatory Visit: Payer: Self-pay

## 2023-03-20 DIAGNOSIS — J302 Other seasonal allergic rhinitis: Secondary | ICD-10-CM

## 2023-03-20 MED ORDER — LEVOCETIRIZINE DIHYDROCHLORIDE 5 MG PO TABS
5.0000 mg | ORAL_TABLET | Freq: Two times a day (BID) | ORAL | 3 refills | Status: DC
  Filled 2023-03-20: qty 90, 90d supply, fill #0
  Filled 2023-06-24: qty 90, 90d supply, fill #1
  Filled 2023-10-01: qty 90, 90d supply, fill #2

## 2023-05-06 ENCOUNTER — Other Ambulatory Visit: Payer: Self-pay | Admitting: Family

## 2023-05-06 ENCOUNTER — Other Ambulatory Visit (HOSPITAL_BASED_OUTPATIENT_CLINIC_OR_DEPARTMENT_OTHER): Payer: Self-pay

## 2023-05-06 ENCOUNTER — Encounter: Payer: Self-pay | Admitting: Family Medicine

## 2023-05-06 MED ORDER — CYCLOBENZAPRINE HCL 5 MG PO TABS
5.0000 mg | ORAL_TABLET | Freq: Three times a day (TID) | ORAL | 0 refills | Status: DC | PRN
  Filled 2023-05-06: qty 30, 10d supply, fill #0

## 2023-06-24 ENCOUNTER — Encounter (HOSPITAL_BASED_OUTPATIENT_CLINIC_OR_DEPARTMENT_OTHER): Payer: Self-pay

## 2023-06-24 ENCOUNTER — Other Ambulatory Visit (HOSPITAL_BASED_OUTPATIENT_CLINIC_OR_DEPARTMENT_OTHER): Payer: Self-pay

## 2023-07-22 ENCOUNTER — Other Ambulatory Visit (HOSPITAL_BASED_OUTPATIENT_CLINIC_OR_DEPARTMENT_OTHER): Payer: Self-pay

## 2023-07-22 ENCOUNTER — Ambulatory Visit (INDEPENDENT_AMBULATORY_CARE_PROVIDER_SITE_OTHER): Payer: Commercial Managed Care - HMO | Admitting: Family Medicine

## 2023-07-22 ENCOUNTER — Encounter: Payer: Self-pay | Admitting: Family Medicine

## 2023-07-22 VITALS — BP 160/100 | HR 80 | Temp 99.3°F | Resp 18 | Ht 66.0 in | Wt 218.0 lb

## 2023-07-22 DIAGNOSIS — E559 Vitamin D deficiency, unspecified: Secondary | ICD-10-CM

## 2023-07-22 DIAGNOSIS — Z0000 Encounter for general adult medical examination without abnormal findings: Secondary | ICD-10-CM

## 2023-07-22 DIAGNOSIS — R739 Hyperglycemia, unspecified: Secondary | ICD-10-CM | POA: Diagnosis not present

## 2023-07-22 DIAGNOSIS — K219 Gastro-esophageal reflux disease without esophagitis: Secondary | ICD-10-CM

## 2023-07-22 DIAGNOSIS — D509 Iron deficiency anemia, unspecified: Secondary | ICD-10-CM | POA: Diagnosis not present

## 2023-07-22 DIAGNOSIS — J302 Other seasonal allergic rhinitis: Secondary | ICD-10-CM

## 2023-07-22 LAB — HEMOGLOBIN A1C: Hgb A1c MFr Bld: 5.9 % (ref 4.6–6.5)

## 2023-07-22 LAB — COMPREHENSIVE METABOLIC PANEL
ALT: 10 U/L (ref 0–35)
AST: 13 U/L (ref 0–37)
Albumin: 4.3 g/dL (ref 3.5–5.2)
Alkaline Phosphatase: 62 U/L (ref 39–117)
BUN: 16 mg/dL (ref 6–23)
CO2: 27 meq/L (ref 19–32)
Calcium: 9.4 mg/dL (ref 8.4–10.5)
Chloride: 104 meq/L (ref 96–112)
Creatinine, Ser: 0.8 mg/dL (ref 0.40–1.20)
GFR: 92.41 mL/min (ref >60.00)
Glucose, Bld: 95 mg/dL (ref 70–99)
Potassium: 4.3 meq/L (ref 3.5–5.1)
Sodium: 137 meq/L (ref 135–145)
Total Bilirubin: 0.2 mg/dL (ref 0.2–1.2)
Total Protein: 7.3 g/dL (ref 6.0–8.3)

## 2023-07-22 LAB — VITAMIN D 25 HYDROXY (VIT D DEFICIENCY, FRACTURES): VITD: 35.47 ng/mL (ref 30.00–100.00)

## 2023-07-22 LAB — LIPID PANEL
Cholesterol: 163 mg/dL (ref 0–200)
HDL: 48.1 mg/dL (ref >39.00)
LDL Cholesterol: 98 mg/dL (ref 0–99)
NonHDL: 114.83
Total CHOL/HDL Ratio: 3
Triglycerides: 84 mg/dL (ref 0.0–149.0)
VLDL: 16.8 mg/dL (ref 0.0–40.0)

## 2023-07-22 LAB — CBC WITH DIFFERENTIAL/PLATELET
Basophils Absolute: 0 10*3/uL (ref 0.0–0.1)
Basophils Relative: 0.7 % (ref 0.0–3.0)
Eosinophils Absolute: 0.2 10*3/uL (ref 0.0–0.7)
Eosinophils Relative: 3.1 % (ref 0.0–5.0)
HCT: 28.4 % — ABNORMAL LOW (ref 36.0–46.0)
Hemoglobin: 8.5 g/dL — ABNORMAL LOW (ref 12.0–15.0)
Lymphocytes Relative: 49.6 % — ABNORMAL HIGH (ref 12.0–46.0)
Lymphs Abs: 2.4 10*3/uL (ref 0.7–4.0)
MCHC: 29.9 g/dL — ABNORMAL LOW (ref 30.0–36.0)
MCV: 67.7 fl — ABNORMAL LOW (ref 78.0–100.0)
Monocytes Absolute: 0.3 10*3/uL (ref 0.1–1.0)
Monocytes Relative: 6.8 % (ref 3.0–12.0)
Neutro Abs: 1.9 10*3/uL (ref 1.4–7.7)
Neutrophils Relative %: 39.8 % — ABNORMAL LOW (ref 43.0–77.0)
Platelets: 468 10*3/uL — ABNORMAL HIGH (ref 150.0–400.0)
RBC: 4.2 Mil/uL (ref 3.87–5.11)
RDW: 18.6 % — ABNORMAL HIGH (ref 11.5–15.5)
WBC: 4.8 10*3/uL (ref 4.0–10.5)

## 2023-07-22 LAB — TSH: TSH: 1.58 u[IU]/mL (ref 0.35–5.50)

## 2023-07-22 LAB — VITAMIN B12: Vitamin B-12: 506 pg/mL (ref 211–911)

## 2023-07-22 MED ORDER — MONTELUKAST SODIUM 10 MG PO TABS
10.0000 mg | ORAL_TABLET | Freq: Every day | ORAL | 3 refills | Status: DC
  Filled 2023-07-22 – 2023-10-01 (×2): qty 90, 90d supply, fill #0

## 2023-07-22 MED ORDER — VITAMIN D (ERGOCALCIFEROL) 1.25 MG (50000 UNIT) PO CAPS
50000.0000 [IU] | ORAL_CAPSULE | ORAL | 2 refills | Status: DC
  Filled 2023-07-22 – 2023-10-01 (×2): qty 12, 84d supply, fill #0

## 2023-07-22 MED ORDER — METOPROLOL TARTRATE 50 MG PO TABS
50.0000 mg | ORAL_TABLET | Freq: Every day | ORAL | 0 refills | Status: DC
  Filled 2023-07-22 – 2023-08-06 (×2): qty 90, 90d supply, fill #0

## 2023-07-22 MED ORDER — FAMOTIDINE 40 MG PO TABS
40.0000 mg | ORAL_TABLET | Freq: Every day | ORAL | 3 refills | Status: DC
  Filled 2023-07-22 – 2023-10-01 (×2): qty 90, 90d supply, fill #0

## 2023-07-22 NOTE — Patient Instructions (Signed)
 Preventive Care 16-40 Years Old, Female  Preventive care refers to lifestyle choices and visits with your health care provider that can promote health and wellness. Preventive care visits are also called wellness exams.  What can I expect for my preventive care visit?  Counseling  Your health care provider may ask you questions about your:  Medical history, including:  Past medical problems.  Family medical history.  Pregnancy history.  Current health, including:  Menstrual cycle.  Method of birth control.  Emotional well-being.  Home life and relationship well-being.  Sexual activity and sexual health.  Lifestyle, including:  Alcohol, nicotine or tobacco, and drug use.  Access to firearms.  Diet, exercise, and sleep habits.  Work and work Astronomer.  Sunscreen use.  Safety issues such as seatbelt and bike helmet use.  Physical exam  Your health care provider will check your:  Height and weight. These may be used to calculate your BMI (body mass index). BMI is a measurement that tells if you are at a healthy weight.  Waist circumference. This measures the distance around your waistline. This measurement also tells if you are at a healthy weight and may help predict your risk of certain diseases, such as type 2 diabetes and high blood pressure.  Heart rate and blood pressure.  Body temperature.  Skin for abnormal spots.  What immunizations do I need?    Vaccines are usually given at various ages, according to a schedule. Your health care provider will recommend vaccines for you based on your age, medical history, and lifestyle or other factors, such as travel or where you work.  What tests do I need?  Screening  Your health care provider may recommend screening tests for certain conditions. This may include:  Lipid and cholesterol levels.  Diabetes screening. This is done by checking your blood sugar (glucose) after you have not eaten for a while (fasting).  Pelvic exam and Pap test.  Hepatitis B test.  Hepatitis C  test.  HIV (human immunodeficiency virus) test.  STI (sexually transmitted infection) testing, if you are at risk.  Lung cancer screening.  Colorectal cancer screening.  Mammogram. Talk with your health care provider about when you should start having regular mammograms. This may depend on whether you have a family history of breast cancer.  BRCA-related cancer screening. This may be done if you have a family history of breast, ovarian, tubal, or peritoneal cancers.  Bone density scan. This is done to screen for osteoporosis.  Talk with your health care provider about your test results, treatment options, and if necessary, the need for more tests.  Follow these instructions at home:  Eating and drinking    Eat a diet that includes fresh fruits and vegetables, whole grains, lean protein, and low-fat dairy products.  Take vitamin and mineral supplements as recommended by your health care provider.  Do not drink alcohol if:  Your health care provider tells you not to drink.  You are pregnant, may be pregnant, or are planning to become pregnant.  If you drink alcohol:  Limit how much you have to 0-1 drink a day.  Know how much alcohol is in your drink. In the [place] one drink equals one 12 oz bottle of beer (355 mL), one 5 oz glass of wine (148 mL), or one 1 oz glass of hard liquor (44 mL).  Lifestyle  Brush your teeth every morning and night with fluoride toothpaste. Floss one time each day.  Exercise for at least  30 minutes 5 or more days each week.  Do not use any products that contain nicotine or tobacco. These products include cigarettes, chewing tobacco, and vaping devices, such as e-cigarettes. If you need help quitting, ask your health care provider.  Do not use drugs.  If you are sexually active, practice safe sex. Use a condom or other form of protection to prevent STIs.  If you do not wish to become pregnant, use a form of birth control. If you plan to become pregnant, see your health care provider for a  prepregnancy visit.  Take aspirin only as told by your health care provider. Make sure that you understand how much to take and what form to take. Work with your health care provider to find out whether it is safe and beneficial for you to take aspirin daily.  Find healthy ways to manage stress, such as:  Meditation, yoga, or listening to music.  Journaling.  Talking to a trusted person.  Spending time with friends and family.  Minimize exposure to UV radiation to reduce your risk of skin cancer.  Safety  Always wear your seat belt while driving or riding in a vehicle.  Do not drive:  If you have been drinking alcohol. Do not ride with someone who has been drinking.  When you are tired or distracted.  While texting.  If you have been using any mind-altering substances or drugs.  Wear a helmet and other protective equipment during sports activities.  If you have firearms in your house, make sure you follow all gun safety procedures.  Seek help if you have been physically or sexually abused.  What's next?  Visit your health care provider once a year for an annual wellness visit.  Ask your health care provider how often you should have your eyes and teeth checked.  Stay up to date on all vaccines.  This information is not intended to replace advice given to you by your health care provider. Make sure you discuss any questions you have with your health care provider.  Document Revised: 10/26/2020 Document Reviewed: 10/26/2020  Elsevier Patient Education  2024 [employer].

## 2023-07-22 NOTE — Progress Notes (Signed)
 Established Patient Office Visit  Subjective   Patient ID: Katherine Mcfarland, female    DOB: 06-15-1985  Age: 38 y.o. MRN: [id number]  Chief Complaint  Patient presents with   Annual Exam    Pt states not fasting.     HPI Discussed the use of AI scribe software for clinical note transcription with the patient, who gave verbal consent to proceed.  History of Present Illness   The patient presents for an annual physical exam.  She is scheduled to have a myomectomy and ablation in two days due to a 10 cm fibroid located in the middle of her uterus, which is causing uterine stretching.  She has a history of elevated blood pressure, with a recent reading of 140/90 mmHg. She takes 25 mg of antihypertensive medication daily but does not monitor her blood pressure at home. Her blood pressure was not noted as high during a recent gynecological visit.  She has iron deficiency anemia and consumes ice daily, indicating possible pica. She takes prenatal vitamins and vitamin D weekly. She has been consuming sea moss for the past three months, which she believes may increase her hemoglobin. She wants her A1c checked due to a previous low reading of 8.8.  She experiences neck pain and missed a chiropractor appointment due to caregiving responsibilities. She also had a migraine on Saturday.  She lives with a nonverbal individual who requires full care, including bathing, dressing, and exercise assistance. 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 Her children have recently moved out, but she still has a 67 and 40 year old at home who help with caregiving tasks.      Patient Active Problem List   Diagnosis Date Noted   Tachycardia 06/15/2021   Primary hypertension 06/15/2021   Uncontrolled type 2 diabetes mellitus with hyperglycemia (HCC) 06/15/2021   Preventative health care 01/24/2021   Gastroesophageal reflux disease without esophagitis 11/18/2020    Depression with anxiety 07/28/2019   Vaginal odor 07/28/2019   Late period 07/28/2019   Seasonal allergies 04/03/2019   PSVT (paroxysmal supraventricular tachycardia) (HCC) 01/27/2019   Infertility counseling 10/18/2017   Other hyperlipidemia 01/09/2017   Vitamin D deficiency 11/07/2016   Prediabetes 11/07/2016   Class 2 obesity with serious comorbidity and body mass index (BMI) of 37.0 to 37.9 in adult 11/07/2016   Anxiety 09/25/2016   Palpitations 02/07/2016   Morbid obesity (HCC) 02/07/2016   Tinnitus of right ear 01/25/2016   Migraine 10/26/2014   Past Medical History:  Diagnosis Date   Allergy    pt. reports seasonal allergies   Headache    Lactose intolerance    Palpitations    Right knee pain    Tinnitus    Past Surgical History:  Procedure Laterality Date   CESAREAN SECTION     WISDOM TOOTH EXTRACTION     Social History   Tobacco Use   Smoking status: Never   Smokeless tobacco: Never  Vaping Use   Vaping status: Never Used  Substance Use Topics   Alcohol use: Yes    Comment: social   Drug use: No   Social History   Socioeconomic History   Marital status: Single    Spouse name: Not on file   Number of children: Not on file   Years of education: Not on file   Highest education level: Not on file  Occupational History   Occupation: CMA    Employer: [employer]  Tobacco Use   Smoking status: Never   Smokeless tobacco: Never  Vaping Use   Vaping status: Never Used  Substance and Sexual Activity   Alcohol use: Yes    Comment: social   Drug use: No   Sexual activity: Yes    Birth control/protection: None  Other Topics Concern   Not on file  Social History Narrative   Not on file   Social Drivers of Health   Financial Resource Strain: Not on file  Food Insecurity: Not on file  Transportation Needs: Not on file  Physical Activity: Not on file  Stress: Not on file  Social Connections: Not on file  Intimate Partner Violence: Not on file    Family Status  Relation Name Status   MGM  Alive   Mother  Alive   Father  Alive   Sister  Alive   Brother  Alive  No partnership data on file   Family History  Problem Relation Age of Onset   Diabetes Maternal Grandmother    Hypertension Maternal Grandmother    Hypertension Mother    Hyperlipidemia Mother    Hypertension Father    Allergies  Allergen Reactions   Metronidazole Nausea And Vomiting   Mushroom Extract Complex (Obsolete) Diarrhea      Review of Systems  Constitutional:  Negative for chills, fever and malaise/fatigue.  HENT:  Negative for congestion and hearing loss.   Eyes:  Negative for discharge.  Respiratory:  Negative for cough, sputum production and shortness of breath.   Cardiovascular:  Negative for chest pain, palpitations and leg swelling.  Gastrointestinal:  Negative for abdominal pain, blood in stool, constipation, diarrhea, heartburn, nausea and vomiting.  Genitourinary:  Negative for dysuria, frequency, hematuria and urgency.  Musculoskeletal:  Negative for back pain, falls and myalgias.  Skin:  Negative for rash.  Neurological:  Negative for dizziness, sensory change, loss of consciousness, weakness and headaches.  Endo/Heme/Allergies:  Negative for environmental allergies. Does not bruise/bleed easily.  Psychiatric/Behavioral:  Negative for depression and suicidal ideas. The patient is not nervous/anxious and does not have insomnia.       Objective:     BP (!) 160/100   Pulse 80   Temp 99.3 F (37.4 C) (Oral)   Resp 18   Ht 5\' 6"  (1.676 m)   Wt 218 lb (98.9 kg)   SpO2 99%   BMI 35.19 kg/m  BP Readings from Last 3 Encounters:  07/22/23 (!) 160/100  07/19/22 120/80  06/22/22 (!) 142/98   Wt Readings from Last 3 Encounters:  07/22/23 218 lb (98.9 kg)  07/19/22 221 lb (100.2 kg)  06/22/22 212 lb (96.2 kg)   SpO2 Readings from Last 3 Encounters:  07/22/23 99%  07/19/22 98%  06/22/22 100%      Physical Exam Vitals and  nursing note reviewed.  Constitutional:      General: She is not in acute distress.    Appearance: Normal appearance. She is well-developed.  HENT:     Head: Normocephalic and atraumatic.     Right Ear: Tympanic membrane, ear canal and external ear normal. There is no impacted cerumen.     Left Ear: Tympanic membrane, ear canal and external ear normal. There is no impacted cerumen.     Nose: Nose normal.     Mouth/Throat:     Mouth: Mucous membranes are moist.     Pharynx: Oropharynx is clear. No oropharyngeal exudate or posterior oropharyngeal erythema.  Eyes:     General:  No scleral icterus.       Right eye: No discharge.        Left eye: No discharge.     Conjunctiva/sclera: Conjunctivae normal.     Pupils: Pupils are equal, round, and reactive to light.  Neck:     Thyroid: No thyromegaly or thyroid tenderness.     Vascular: No JVD.  Cardiovascular:     Rate and Rhythm: Normal rate and regular rhythm.     Heart sounds: Normal heart sounds. No murmur heard. Pulmonary:     Effort: Pulmonary effort is normal. No respiratory distress.     Breath sounds: Normal breath sounds.  Abdominal:     General: Bowel sounds are normal. There is no distension.     Palpations: Abdomen is soft. There is no mass.     Tenderness: There is no abdominal tenderness. There is no guarding or rebound.  Genitourinary:    Vagina: Normal.  Musculoskeletal:        General: Normal range of motion.     Cervical back: Normal range of motion and neck supple.     Right lower leg: No edema.     Left lower leg: No edema.  Lymphadenopathy:     Cervical: No cervical adenopathy.  Skin:    General: Skin is warm and dry.     Findings: No erythema or rash.  Neurological:     Mental Status: She is alert and oriented to person, place, and time.     Cranial Nerves: No cranial nerve deficit.     Deep Tendon Reflexes: Reflexes are normal and symmetric.  Psychiatric:        Mood and Affect: Mood normal.         Behavior: Behavior normal.        Thought Content: Thought content normal.        Judgment: Judgment normal.      No results found for any visits on 07/22/23.  Last CBC Lab Results  Component Value Date   WBC 5.9 07/19/2022   HGB 8.8 Repeated and verified X2. (L) 07/19/2022   HCT 28.2 (L) 07/19/2022   MCV 70.0 (L) 07/19/2022   MCH 26.2 (L) 07/18/2018   RDW 19.2 (H) 07/19/2022   PLT 481.0 (H) 07/19/2022   Last metabolic panel Lab Results  Component Value Date   GLUCOSE 89 07/19/2022   NA 137 07/19/2022   K 4.5 07/19/2022   CL 105 07/19/2022   CO2 26 07/19/2022   BUN 15 07/19/2022   CREATININE 0.87 07/19/2022   GFR 84.15 07/19/2022   CALCIUM 9.2 07/19/2022   PROT 6.8 07/19/2022   ALBUMIN 3.9 07/19/2022   LABGLOB 3.2 09/26/2016   AGRATIO 1.3 09/26/2016   BILITOT 0.2 07/19/2022   ALKPHOS 57 07/19/2022   AST 14 07/19/2022   ALT 11 07/19/2022   Last lipids Lab Results  Component Value Date   CHOL 169 07/19/2022   HDL 43.70 07/19/2022   LDLCALC 99 07/19/2022   TRIG 131.0 07/19/2022   CHOLHDL 4 07/19/2022   Last hemoglobin A1c Lab Results  Component Value Date   HGBA1C 5.9 07/19/2022   Last thyroid functions Lab Results  Component Value Date   TSH 1.24 07/19/2022   T3TOTAL 133 09/26/2016   T4TOTAL 9.1 02/09/2016   Last vitamin D Lab Results  Component Value Date   VD25OH 19.17 (L) 07/19/2022   Last vitamin B12 and Folate Lab Results  Component Value Date   VITAMINB12 375 07/19/2022  FOLATE 10.9 09/26/2016      The 10-year ASCVD risk score (Arnett DK, et al., 2019) is: 16.8%    Assessment & Plan:   Problem List Items Addressed This Visit       Unprioritized   Vitamin D deficiency   Relevant Medications   Vitamin D, Ergocalciferol, (DRISDOL) 1.25 MG (50000 UNIT) CAPS capsule   Seasonal allergies   Relevant Medications   montelukast (SINGULAIR) 10 MG tablet   Preventative health care - Primary   Relevant Orders   CBC with  Differential/Platelet   Comprehensive metabolic panel   Lipid panel   TSH   Morbid obesity (HCC)   Relevant Orders   Vitamin B12   VITAMIN D 25 Hydroxy (Vit-D Deficiency, Fractures)   Insulin, random   Gastroesophageal reflux disease without esophagitis   Relevant Medications   famotidine (PEPCID) 40 MG tablet   Other Visit Diagnoses       Iron deficiency anemia, unspecified iron deficiency anemia type       Relevant Orders   Iron, TIBC and Ferritin Panel   Vitamin B12   VITAMIN D 25 Hydroxy (Vit-D Deficiency, Fractures)     Hyperglycemia       Relevant Orders   Hemoglobin A1c     Assessment and Plan    Uterine Fibroids A 10 cm fibroid is causing significant uterine enlargement. She is scheduled for myomectomy and ablation in two days. Variable success rates were discussed based on anecdotal evidence. Proceed with the scheduled procedures.  Iron Deficiency Anemia Chronic anemia with pica is present. She is currently on prenatal vitamins and vitamin D, with no recent iron studies. There is a potential need for iron infusion if hemoglobin levels are significantly low. Order CBC and iron studies. Consider referral for iron infusion based on lab results.  Hypertension Blood pressure is 140/90 mmHg, and she is adherent to the current antihypertensive regimen. There has been no recent home monitoring, and there is a possible correlation with anemia. Discussed the potential need to increase medication dosage if blood pressure remains elevated. Recheck blood pressure after rest. Consider increasing antihypertensive medication from 25 mg to 50 mg if blood pressure remains elevated.  General Health Maintenance She is up-to-date with dental and eye exams. A recent diagnostic mammogram for dense breast tissue was normal. No recent vaccinations were discussed. Schedule a routine mammogram at the end of the year.  Follow-up Follow up in one month for anemia recheck and with the gynecologist  post-myomectomy.        Return in about 1 year (around 07/21/2024), or if symptoms worsen or fail to improve, for fasting, annual exam.    Donato Schultz, DO

## 2023-07-23 LAB — IRON,TIBC AND FERRITIN PANEL
%SAT: 14 % — ABNORMAL LOW (ref 16–45)
Ferritin: 5 ng/mL — ABNORMAL LOW (ref 16–154)
Iron: 61 ug/dL (ref 40–190)
TIBC: 427 ug/dL (ref 250–450)

## 2023-07-23 LAB — INSULIN, RANDOM: Insulin: 12.3 u[IU]/mL

## 2023-07-31 ENCOUNTER — Encounter: Payer: Self-pay | Admitting: Family Medicine

## 2023-08-02 ENCOUNTER — Other Ambulatory Visit (HOSPITAL_BASED_OUTPATIENT_CLINIC_OR_DEPARTMENT_OTHER): Payer: Self-pay

## 2023-08-05 NOTE — Progress Notes (Unsigned)
 Pt here for Blood pressure check per Dr Laury Axon on 07/22/23: "Discussed the potential need to increase medication dosage if blood pressure remains elevated. Recheck blood pressure after rest. Consider increasing antihypertensive medication from 25 mg to 50 mg if blood pressure remains elevated. "  Pt currently takes: Lopressor 50 mg daily. Pt reports compliance with medication.   BP today @ = 136 80 HR = 75  Pt would like for [place] to send her a message on mychart after we speak to DOD: Drue Novel   I sent the message to her letting her know that Dr Drue Novel advised her to continue her Metoprolol at the 50 mg dose, check BP readings, call if the start to creep up. Otherwise f/u in 1 year as per Dr Hulan Saas instructions at her CPE.

## 2023-08-06 ENCOUNTER — Other Ambulatory Visit (HOSPITAL_BASED_OUTPATIENT_CLINIC_OR_DEPARTMENT_OTHER): Payer: Self-pay

## 2023-08-06 ENCOUNTER — Ambulatory Visit (INDEPENDENT_AMBULATORY_CARE_PROVIDER_SITE_OTHER): Payer: Self-pay

## 2023-08-06 VITALS — BP 136/80 | HR 75

## 2023-08-06 DIAGNOSIS — I10 Essential (primary) hypertension: Secondary | ICD-10-CM

## 2023-08-07 ENCOUNTER — Other Ambulatory Visit (HOSPITAL_BASED_OUTPATIENT_CLINIC_OR_DEPARTMENT_OTHER): Payer: Self-pay

## 2023-08-07 MED ORDER — METOPROLOL SUCCINATE ER 50 MG PO TB24
50.0000 mg | ORAL_TABLET | Freq: Every day | ORAL | 1 refills | Status: DC
  Filled 2023-08-07: qty 90, 90d supply, fill #0
  Filled 2023-08-27: qty 30, 30d supply, fill #0
  Filled 2023-10-01: qty 90, 90d supply, fill #0

## 2023-08-19 ENCOUNTER — Other Ambulatory Visit (HOSPITAL_BASED_OUTPATIENT_CLINIC_OR_DEPARTMENT_OTHER): Payer: Self-pay

## 2023-08-27 ENCOUNTER — Other Ambulatory Visit (HOSPITAL_BASED_OUTPATIENT_CLINIC_OR_DEPARTMENT_OTHER): Payer: Self-pay

## 2023-09-11 ENCOUNTER — Other Ambulatory Visit (HOSPITAL_BASED_OUTPATIENT_CLINIC_OR_DEPARTMENT_OTHER): Payer: Self-pay

## 2023-10-01 ENCOUNTER — Other Ambulatory Visit (HOSPITAL_BASED_OUTPATIENT_CLINIC_OR_DEPARTMENT_OTHER): Payer: Self-pay

## 2023-11-21 ENCOUNTER — Inpatient Hospital Stay (HOSPITAL_COMMUNITY)
Admission: AD | Admit: 2023-11-21 | Discharge: 2023-11-22 | Disposition: A | Discharge status: Home / Self Care | Payer: Self-pay | Attending: Obstetrics and Gynecology | Admitting: Obstetrics and Gynecology

## 2023-11-21 DIAGNOSIS — D259 Leiomyoma of uterus, unspecified: Secondary | ICD-10-CM | POA: Insufficient documentation

## 2023-11-21 DIAGNOSIS — Z3202 Encounter for pregnancy test, result negative: Secondary | ICD-10-CM | POA: Insufficient documentation

## 2023-11-21 DIAGNOSIS — K625 Hemorrhage of anus and rectum: Secondary | ICD-10-CM | POA: Insufficient documentation

## 2023-11-21 DIAGNOSIS — N939 Abnormal uterine and vaginal bleeding, unspecified: Secondary | ICD-10-CM | POA: Insufficient documentation

## 2023-11-21 DIAGNOSIS — R102 Pelvic and perineal pain: Secondary | ICD-10-CM | POA: Insufficient documentation

## 2023-11-21 LAB — POCT PREGNANCY, URINE: Preg Test, Ur: NEGATIVE

## 2023-11-21 NOTE — MAU Note (Addendum)
 Katherine Mcfarland is a 38 y.o. at Unknown here in MAU reporting having a bad headache all day. At lunch she had blood in her panties and she kept going with her day. Tonight she noticed she had rectal bleeding. She does have hx of anemia. Pt does have 10cm fibroid. No hx hemorrhoids. Having pain in lower abd and lower back that comes and goes like contractions. Pt is sure bleeding is vaginal and rectal. Pt has HTN and is taking Metropolol 50mg  daily which she had this am. Pt does not know if she is pregnant or not. She took Ibuprofen  800mg  at 2030 and has not helped  LMP: 11/07/23 Onset of complaint: today Pain score: 10 Vitals:   11/21/23 2220  Pulse: 84  Resp: 17  Temp: 98.2 F (36.8 C)  SpO2: 100%     FHT: n/a  Lab orders placed from triage: upt

## 2023-11-22 ENCOUNTER — Encounter (HOSPITAL_COMMUNITY): Payer: Self-pay

## 2023-11-22 ENCOUNTER — Other Ambulatory Visit: Payer: Self-pay

## 2023-11-22 ENCOUNTER — Other Ambulatory Visit (HOSPITAL_BASED_OUTPATIENT_CLINIC_OR_DEPARTMENT_OTHER): Payer: Self-pay

## 2023-11-22 ENCOUNTER — Emergency Department (HOSPITAL_COMMUNITY)
Admission: EM | Admit: 2023-11-22 | Discharge: 2023-11-22 | Disposition: A | Discharge status: Home / Self Care | Payer: Self-pay | Attending: Emergency Medicine | Admitting: Emergency Medicine

## 2023-11-22 ENCOUNTER — Emergency Department (HOSPITAL_COMMUNITY): Payer: Self-pay

## 2023-11-22 DIAGNOSIS — D259 Leiomyoma of uterus, unspecified: Secondary | ICD-10-CM

## 2023-11-22 DIAGNOSIS — R1084 Generalized abdominal pain: Secondary | ICD-10-CM | POA: Insufficient documentation

## 2023-11-22 DIAGNOSIS — N939 Abnormal uterine and vaginal bleeding, unspecified: Secondary | ICD-10-CM | POA: Insufficient documentation

## 2023-11-22 DIAGNOSIS — D509 Iron deficiency anemia, unspecified: Secondary | ICD-10-CM | POA: Insufficient documentation

## 2023-11-22 DIAGNOSIS — K625 Hemorrhage of anus and rectum: Secondary | ICD-10-CM

## 2023-11-22 DIAGNOSIS — R102 Pelvic and perineal pain: Secondary | ICD-10-CM | POA: Insufficient documentation

## 2023-11-22 LAB — PREPARE RBC (CROSSMATCH)

## 2023-11-22 LAB — BASIC METABOLIC PANEL WITH GFR
Anion gap: 8 (ref 5–15)
BUN: 12 mg/dL (ref 6–20)
CO2: 22 mmol/L (ref 22–32)
Calcium: 8.6 mg/dL — ABNORMAL LOW (ref 8.9–10.3)
Chloride: 105 mmol/L (ref 98–111)
Creatinine, Ser: 0.71 mg/dL (ref 0.44–1.00)
GFR, Estimated: >60 mL/min (ref >60)
Glucose, Bld: 88 mg/dL (ref 70–99)
Potassium: 3.8 mmol/L (ref 3.5–5.1)
Sodium: 135 mmol/L (ref 135–145)

## 2023-11-22 LAB — HCG, SERUM, QUALITATIVE: Preg, Serum: NEGATIVE

## 2023-11-22 LAB — CBC
HCT: 23.2 % — ABNORMAL LOW (ref 36.0–46.0)
Hemoglobin: 6.5 g/dL — CL (ref 12.0–15.0)
MCH: 19 pg — ABNORMAL LOW (ref 26.0–34.0)
MCHC: 28 g/dL — ABNORMAL LOW (ref 30.0–36.0)
MCV: 67.8 fL — ABNORMAL LOW (ref 80.0–100.0)
Platelets: 409 K/uL — ABNORMAL HIGH (ref 150–400)
RBC: 3.42 MIL/uL — ABNORMAL LOW (ref 3.87–5.11)
RDW: 16.3 % — ABNORMAL HIGH (ref 11.5–15.5)
WBC: 5.8 K/uL (ref 4.0–10.5)
nRBC: 0 % (ref 0.0–0.2)

## 2023-11-22 MED ORDER — MEGESTROL ACETATE 40 MG PO TABS
40.0000 mg | ORAL_TABLET | Freq: Every day | ORAL | Status: DC
  Administered 2023-11-22: 40 mg via ORAL
  Filled 2023-11-22: qty 1

## 2023-11-22 MED ORDER — ONDANSETRON HCL 4 MG/2ML IJ SOLN
4.0000 mg | Freq: Once | INTRAMUSCULAR | Status: AC
  Administered 2023-11-22: 4 mg via INTRAVENOUS
  Filled 2023-11-22: qty 2

## 2023-11-22 MED ORDER — TRAMADOL HCL 50 MG PO TABS: 50.0000 mg | ORAL_TABLET | Freq: Four times a day (QID) | ORAL | 0 refills | Status: DC | PRN

## 2023-11-22 MED ORDER — MORPHINE SULFATE (PF) 4 MG/ML IV SOLN
4.0000 mg | Freq: Once | INTRAVENOUS | Status: AC
  Administered 2023-11-22: 4 mg via INTRAVENOUS
  Filled 2023-11-22: qty 1

## 2023-11-22 MED ORDER — HYDROCODONE-ACETAMINOPHEN 5-325 MG PO TABS
1.0000 | ORAL_TABLET | Freq: Four times a day (QID) | ORAL | 0 refills | Status: DC | PRN
  Filled 2023-11-22: qty 6, 2d supply, fill #0

## 2023-11-22 MED ORDER — MEGESTROL ACETATE 40 MG PO TABS: 40.0000 mg | ORAL_TABLET | Freq: Two times a day (BID) | ORAL | 0 refills | Status: DC

## 2023-11-22 MED ORDER — SODIUM CHLORIDE 0.9% IV SOLUTION
Freq: Once | INTRAVENOUS | Status: AC
Start: 2023-11-22 — End: 2023-11-22

## 2023-11-22 MED ORDER — HYDROCODONE-ACETAMINOPHEN 5-325 MG PO TABS: 1.0000 | ORAL_TABLET | Freq: Four times a day (QID) | ORAL | 0 refills | Status: DC | PRN

## 2023-11-22 MED ORDER — MEGESTROL ACETATE 40 MG PO TABS
40.0000 mg | ORAL_TABLET | Freq: Two times a day (BID) | ORAL | 0 refills | Status: DC
  Filled 2023-11-22: qty 60, 30d supply, fill #0

## 2023-11-22 NOTE — ED Provider Notes (Signed)
 [place] EMERGENCY DEPARTMENT AT [facility] Provider Note   CSN: [id number] Arrival date & time: 11/22/23  1255     Patient presents with: Vaginal Bleeding  HPI Katherine Mcfarland is a 38 y.o. female with history of uterine fibroids presenting for abnormal vaginal bleeding.  Started yesterday.  She states she has been soaking about a pad an hour since yesterday.  States she is somewhat lightheaded but not short of breath at this time.  Denies chest pain.  Also reporting generalized lower abdominal pain.  She does report a history of uterine fibroids.  She states she was diagnosed with fibroids about 3 years ago and was told this year that there larger in size.    Vaginal Bleeding      Prior to Admission medications   Medication Sig Start Date End Date Taking? Authorizing Provider  HYDROcodone -acetaminophen  (NORCO/VICODIN) 5-325 MG tablet Take 1 tablet by mouth every 6 (six) hours as needed. 11/22/23  Yes Emine Lopata K, PA-C  megestrol  (MEGACE ) 40 MG tablet Take 1 tablet (40 mg total) by mouth 2 (two) times daily. 11/22/23  Yes Levi Klaiber K, PA-C  cyclobenzaprine  (FLEXERIL ) 5 MG tablet Take 1 tablet (5 mg total) by mouth 3 (three) times daily as needed for muscle spasms. 05/06/23   Webb, Padonda B, FNP  famotidine  (PEPCID ) 40 MG tablet Take 1 tablet (40 mg total) by mouth daily. 07/22/23 07/21/24  Antonio Cyndee Jamee JONELLE, DO  fluconazole  (DIFLUCAN ) 150 MG tablet Take 1 tablet by mouth once. May repeat in 3 days if needed. 09/18/22   Antonio Cyndee Jamee JONELLE, DO  levocetirizine (XYZAL ) 5 MG tablet Take 1 tablet (5 mg total) by mouth 2 (two) times daily. 03/20/23   Antonio Cyndee Jamee JONELLE, DO  metoprolol  succinate (TOPROL -XL) 50 MG 24 hr tablet Take 1 tablet (50 mg total) by mouth daily. Take with or immediately following a meal. 08/07/23   Antonio Cyndee, Jamee JONELLE, DO  montelukast  (SINGULAIR ) 10 MG tablet Take 1 tablet (10 mg total) by mouth at bedtime. 07/22/23   Lowne Chase, Yvonne R, DO   Prenatal Vit-Fe Fumarate-FA (MULTIVITAMIN-PRENATAL) 27-0.8 MG TABS tablet Take 1 tablet by mouth daily. 07/17/21   Antonio Cyndee Jamee JONELLE, DO  traMADol  (ULTRAM ) 50 MG tablet Take 1-2 tablets (50-100 mg total) by mouth every 6 (six) hours as needed. 11/22/23   Leftwich-Kirby, Olam DELENA, CNM  tretinoin  (RETIN-A ) 0.025 % cream Apply topically at bedtime. 01/24/21   Antonio Cyndee Jamee R, DO  Vitamin D , Ergocalciferol , (DRISDOL ) 1.25 MG (50000 UNIT) CAPS capsule Take 1 capsule (50,000 Units total) by mouth once a week. 07/22/23 07/21/24  Antonio Cyndee Jamee JONELLE, DO    Allergies: Metronidazole  and Mushroom extract complex (obsolete)    Review of Systems  Genitourinary:  Positive for vaginal bleeding.    Updated Vital Signs BP (!) 160/93   Pulse 71   Temp 98.3 F (36.8 C) (Oral)   Resp 18   Ht 5' 6 (1.676 m)   Wt 96.6 kg   LMP 11/07/2023 (Exact Date)   SpO2 100%   BMI 34.38 kg/m    Physical Exam   Vitals:   11/22/23 1934 11/22/23 1934  BP:    Pulse:    Resp:    Temp: 98.3 F (36.8 C)   SpO2:  100%    CONSTITUTIONAL:  well-appearing, NAD NEURO:  Alert and oriented x 3, CN 3-12 grossly intact EYES:  eyes equal and reactive ENT/NECK:  Supple, no stridor  CARDIO:  Regular rate and rhythm, appears well-perfused  PULM:  No respiratory distress, CTAB GI/GU:  non-distended, soft, lower abdominal tenderness Pelvic exam: Chaperone present, small amount of dark red blood in the vaginal vault.  Cervical os is closed.  No evidence of trauma or foreign body.  No pustulant discharge.  No CMT. MSK/SPINE:  No gross deformities, no edema, moves all extremities  SKIN:  no rash, atraumatic  *Additional and/or pertinent findings included in MDM below  (all labs ordered are listed, but only abnormal results are displayed) Labs Reviewed  CBC - Abnormal; Notable for the following components:      Result Value   RBC 3.42 (*)    Hemoglobin 6.5 (*)    HCT 23.2 (*)    MCV 67.8 (*)    MCH 19.0 (*)     MCHC 28.0 (*)    RDW 16.3 (*)    Platelets 409 (*)    All other components within normal limits  BASIC METABOLIC PANEL WITH GFR - Abnormal; Notable for the following components:   Calcium 8.6 (*)    All other components within normal limits  HCG, SERUM, QUALITATIVE  PREPARE RBC (CROSSMATCH)  TYPE AND SCREEN    EKG: None  Radiology: US  PELVIC COMPLETE W TRANSVAGINAL AND TORSION R/O Result Date: 11/22/2023 CLINICAL DATA:  [id number] Pelvic pain [id number] EXAM: TRANSABDOMINAL AND TRANSVAGINAL ULTRASOUND OF PELVIS DOPPLER ULTRASOUND OF OVARIES TECHNIQUE: Both transabdominal and transvaginal ultrasound examinations of the pelvis were performed. Transabdominal technique was performed for global imaging of the pelvis including uterus, ovaries, adnexal regions, and pelvic cul-de-sac. It was necessary to proceed with endovaginal exam following the transabdominal exam to visualize the uterus and ovaries. Color and duplex Doppler ultrasound was utilized to evaluate blood flow to the ovaries. COMPARISON:  July 23, 2016 FINDINGS: Uterus Measurements: 16.5 x 11.3 x 12.6 cm = volume: 885 mL. There are 2 large fibroids. The first measures 6.6 x 6.3 x 6.4 cm. The second measures 5.8 x 5.7 x 5.7 cm. Endometrium Not well visualized or evaluated, obscured by the large fibroids. Right ovary Not well visualized or evaluated, due to overlying bowel gas and likely high ovarian positioning. Left ovary Measurements: 4.1 x 3.6 x 2.5 cm = volume: 18.9 mL. Normal appearance/no adnexal mass. Likely dominant follicle measuring 1.7 cm. Pulsed Doppler evaluation demonstrates normal low-resistance arterial and venous waveforms in the left ovary. Other findings:  No abnormal free fluid IMPRESSION: 1. The right ovary was not well visualized due to overlying bowel gas and likely high ovarian positioning. Otherwise, no sonographic findings of ovarian torsion in the left ovary, at this time. 2. Markedly enlarged uterus containing a couple of  large fibroids, the largest measures 6.6 x 6.3 x 6.4 cm, significantly larger in the interim. Electronically Signed   By: Rogelia Myers M.D.   On: 11/22/2023 18:01     .Critical Care  Performed by: Lang Norleen POUR, PA-C Authorized by: Lang Norleen POUR, PA-C   Critical care provider statement:    Critical care time (minutes):  30   Critical care was necessary to treat or prevent imminent or life-threatening deterioration of the following conditions: anemia.   Critical care was time spent personally by me on the following activities:  Development of treatment plan with patient or surrogate, discussions with consultants, evaluation of patient's response to treatment, examination of patient, ordering and review of laboratory studies, ordering and review of radiographic studies, ordering and performing treatments and interventions, pulse oximetry, re-evaluation of  patient's condition and review of old charts    Medications Ordered in the ED  megestrol  (MEGACE ) tablet 40 mg (40 mg Oral Given 11/22/23 1840)  0.9 %  sodium chloride  infusion (Manually program via Guardrails IV Fluids) (0 mLs Intravenous Stopped 11/22/23 1935)  morphine  (PF) 4 MG/ML injection 4 mg (4 mg Intravenous Given 11/22/23 1544)  ondansetron  (ZOFRAN ) injection 4 mg (4 mg Intravenous Given 11/22/23 1545)    Clinical Course as of 11/22/23 1956  Fri Nov 22, 2023  1756 Per Dr. Zina: Megace  40 mg BID until she is seen by gyn. [JR]    Clinical Course User Index [JR] Lang Norleen POUR, PA-C                                 Medical Decision Making Amount and/or Complexity of Data Reviewed Labs: ordered. Radiology: ordered.  Risk Prescription drug management.   Initial Impression and Ddx 38 year old well-appearing female presenting for vaginal bleeding.  Exam notable for small amount of blood in the vaginal vault but otherwise reassuring.  DDx includes symptomatic anemia, acute vaginal or uterine hemorrhage, PID, ovarian  torsion, ectopic pregnancy, appendicitis, other. Patient PMH that increases complexity of ED encounter:  h/o fibroids  Interpretation of Diagnostics - I independent reviewed and interpreted the labs as followed: anemia (hgb 6.5)  - I independently visualized the following imaging with scope of interpretation limited to determining acute life threatening conditions related to emergency care: US  pelvic, which revealed enlarged uterus and fibroids but no other acute findings.  Shared findings with patient.  Patient Reassessment and Ultimate Disposition/Management Remains well-appearing, hemodynamically stable and in no acute distress.  Discussed patient with Dr. Zina of OB/GYN who recommended Megace  twice daily and follow-up with Gyn.  Administer 1 unit of PRBCs.  Discussed return precautions.  Discharged in good condition.  Patient management required discussion with the following services or consulting groups:  None  Complexity of Problems Addressed Acute complicated illness or Injury  Additional Data Reviewed and Analyzed Further history obtained from: Past medical history and medications listed in the EMR and Prior ED visit notes  Patient Encounter Risk Assessment Consideration of hospitalization      Final diagnoses:  Vaginal bleeding  Iron  deficiency anemia, unspecified iron  deficiency anemia type    ED Discharge Orders          Ordered    megestrol  (MEGACE ) 40 MG tablet  2 times daily        11/22/23 1759    HYDROcodone -acetaminophen  (NORCO/VICODIN) 5-325 MG tablet  Every 6 hours PRN        11/22/23 1956               Lang Norleen POUR, PA-C 11/22/23 1959    Ruthe Cornet, DO 11/22/23 2306

## 2023-11-22 NOTE — Discharge Instructions (Signed)
 Evaluation revealed that you were anemic likely secondary to your vaginal bleeding.  Per OB/GYN recommendations please take Megace  twice a day and follow-up in their office.  If you have worsening vaginal bleeding, worsening abdominal pain or any other concerning symptom please return to the ED for further evaluation.  I also sent a few tablets of Norco for abdominal pain but would also recommend Tylenol  ibuprofen  and warm compresses.

## 2023-11-22 NOTE — ED Notes (Signed)
 This RN obtained blood consent, electronic consent form signed.

## 2023-11-22 NOTE — ED Notes (Signed)
 Patient transported to Ultrasound

## 2023-11-22 NOTE — ED Notes (Signed)
 LG tube also sent to main lab

## 2023-11-22 NOTE — MAU Provider Note (Signed)
 Chief Complaint: Rectal Bleeding   None     SUBJECTIVE HPI: Katherine Mcfarland is a 38 y.o. [phone] who presents to maternity admissions for onset of vaginal bleeding and abdominal pain today, then in the shower later noticed the bleeding also seemed to be from the rectum.  She is unsure of pregnancy.  She was diagnosed with a 10 cm uterine fibroid ~ 1 year ago and periods have become longer and more painful but this episode today is in between periods, which is unusual. She denies any recent constipation or diarrhea.  She took 800 mg ibuprofen  at home but it did not help the pain.  Past Medical History:  Diagnosis Date   Allergy    pt. reports seasonal allergies   Headache    Lactose intolerance    Palpitations    Right knee pain    Tinnitus    Past Surgical History:  Procedure Laterality Date   CESAREAN SECTION     WISDOM TOOTH EXTRACTION     Social History   Socioeconomic History   Marital status: Single    Spouse name: Not on file   Number of children: Not on file   Years of education: Not on file   Highest education level: Not on file  Occupational History   Occupation: CMA    Employer: [employer]  Tobacco Use   Smoking status: Never   Smokeless tobacco: Never  Vaping Use   Vaping status: Never Used  Substance and Sexual Activity   Alcohol use: Yes    Comment: social   Drug use: No   Sexual activity: Yes    Birth control/protection: None  Other Topics Concern   Not on file  Social History Narrative   Not on file   Social Drivers of Health   Financial Resource Strain: Not on file  Food Insecurity: Not on file  Transportation Needs: Not on file  Physical Activity: Not on file  Stress: Not on file  Social Connections: Not on file  Intimate Partner Violence: Not on file   No current facility-administered medications on file prior to encounter.   Current Outpatient Medications on File Prior to Encounter  Medication Sig Dispense Refill    cyclobenzaprine  (FLEXERIL ) 5 MG tablet Take 1 tablet (5 mg total) by mouth 3 (three) times daily as needed for muscle spasms. 30 tablet 0   famotidine  (PEPCID ) 40 MG tablet Take 1 tablet (40 mg total) by mouth daily. 90 tablet 3   fluconazole  (DIFLUCAN ) 150 MG tablet Take 1 tablet by mouth once. May repeat in 3 days if needed. 12 tablet 3   levocetirizine (XYZAL ) 5 MG tablet Take 1 tablet (5 mg total) by mouth 2 (two) times daily. 180 tablet 3   metoprolol  succinate (TOPROL -XL) 50 MG 24 hr tablet Take 1 tablet (50 mg total) by mouth daily. Take with or immediately following a meal. 90 tablet 1   montelukast  (SINGULAIR ) 10 MG tablet Take 1 tablet (10 mg total) by mouth at bedtime. 90 tablet 3   Prenatal Vit-Fe Fumarate-FA (MULTIVITAMIN-PRENATAL) 27-0.8 MG TABS tablet Take 1 tablet by mouth daily. 90 tablet 3   tretinoin  (RETIN-A ) 0.025 % cream Apply topically at bedtime. 45 g 3   Vitamin D , Ergocalciferol , (DRISDOL ) 1.25 MG (50000 UNIT) CAPS capsule Take 1 capsule (50,000 Units total) by mouth once a week. 12 capsule 2   Allergies  Allergen Reactions   Metronidazole  Nausea And Vomiting   Mushroom Extract Complex (Obsolete) Diarrhea  ROS:  Review of Systems  Constitutional:  Negative for chills, fatigue and fever.  Respiratory:  Negative for shortness of breath.   Cardiovascular:  Negative for chest pain.  Gastrointestinal:  Positive for abdominal pain and anal bleeding.  Genitourinary:  Positive for pelvic pain and vaginal bleeding. Negative for difficulty urinating, dysuria, flank pain, vaginal discharge and vaginal pain.  Neurological:  Negative for dizziness and headaches.  Psychiatric/Behavioral: Negative.      I have reviewed patient's Past Medical Hx, Surgical Hx, Family Hx, Social Hx, medications and allergies.   Physical Exam  Patient Vitals for the past 24 hrs:  BP Temp Pulse Resp SpO2 Height Weight  11/22/23 0048 116/78 -- 80 -- 100 % -- --  11/21/23 2231 (!) 187/93 -- --  -- -- -- --  11/21/23 2220 -- 98.2 F (36.8 C) 84 17 100 % 5' 6 (1.676 m) 97 kg   Physical Exam Vitals and nursing note reviewed.  Constitutional:      Appearance: She is well-developed.  Cardiovascular:     Rate and Rhythm: Normal rate.  Pulmonary:     Effort: Pulmonary effort is normal.  Abdominal:     Palpations: Abdomen is soft.  Musculoskeletal:        General: Normal range of motion.     Cervical back: Normal range of motion.  Skin:    General: Skin is warm and dry.  Neurological:     Mental Status: She is alert and oriented to person, place, and time.  Psychiatric:        Behavior: Behavior normal.        Thought Content: Thought content normal.        Judgment: Judgment normal.     MDM:  Negative pregnancy test.  Pt stable, with new onset AUB and possibly rectal bleeding.  Without acute findings, pt directed to ED or Urgent Care if further evaluation is desired. Pt had [facility]/Gyn provider at [facility], so plan to call on Monday morning to schedule urgent follow up.  Bleeding precautions/reasons to seek emergency care reviewed. Rx for Tramadol  10 tablets for pain, continue ibuprofen  at home.    ASSESSMENT MSE Complete 1. Abnormal uterine bleeding (AUB)   2. Rectal bleeding   3. Negative pregnancy test   4. Uterine leiomyoma, unspecified location     PLAN Discharge patient  F/U with [facility] or Urgent Care/ED if symptoms worsen  Milly Olam LABOR, CNM 11/22/2023 2:22 AM

## 2023-11-22 NOTE — ED Triage Notes (Signed)
 Pt to ED c/o abdominal cramping x 1 day and heavy vaginal bleeding. Denies clots.

## 2023-11-23 LAB — BPAM RBC
Blood Product Expiration Date: 202508122359
ISSUE DATE / TIME: 202507111724
Unit Type and Rh: 7300

## 2023-11-23 LAB — TYPE AND SCREEN
ABO/RH(D): B POS
Antibody Screen: NEGATIVE
Unit division: 0

## 2023-11-25 ENCOUNTER — Other Ambulatory Visit (HOSPITAL_BASED_OUTPATIENT_CLINIC_OR_DEPARTMENT_OTHER): Payer: Self-pay

## 2023-11-25 ENCOUNTER — Other Ambulatory Visit: Payer: Self-pay

## 2023-11-28 ENCOUNTER — Encounter: Payer: Self-pay | Admitting: Family Medicine

## 2023-12-03 NOTE — Telephone Encounter (Signed)
 Okay to refill?

## 2023-12-04 ENCOUNTER — Other Ambulatory Visit (HOSPITAL_BASED_OUTPATIENT_CLINIC_OR_DEPARTMENT_OTHER): Payer: Self-pay

## 2023-12-04 MED ORDER — MEGESTROL ACETATE 40 MG PO TABS
40.0000 mg | ORAL_TABLET | Freq: Two times a day (BID) | ORAL | 0 refills | Status: DC
  Filled 2023-12-04: qty 60, 30d supply, fill #0

## 2023-12-16 ENCOUNTER — Ambulatory Visit: Payer: Self-pay | Admitting: Family Medicine

## 2023-12-16 ENCOUNTER — Other Ambulatory Visit (HOSPITAL_BASED_OUTPATIENT_CLINIC_OR_DEPARTMENT_OTHER): Payer: Self-pay

## 2023-12-16 ENCOUNTER — Ambulatory Visit (INDEPENDENT_AMBULATORY_CARE_PROVIDER_SITE_OTHER): Payer: Self-pay | Admitting: Family Medicine

## 2023-12-16 ENCOUNTER — Encounter: Payer: Self-pay | Admitting: Family Medicine

## 2023-12-16 VITALS — BP 120/86 | HR 84 | Temp 98.4°F | Resp 18 | Ht 66.0 in | Wt 208.2 lb

## 2023-12-16 DIAGNOSIS — D509 Iron deficiency anemia, unspecified: Secondary | ICD-10-CM | POA: Diagnosis not present

## 2023-12-16 LAB — CBC WITH DIFFERENTIAL/PLATELET
Basophils Absolute: 0.1 K/uL (ref 0.0–0.1)
Basophils Relative: 0.9 % (ref 0.0–3.0)
Eosinophils Absolute: 0.2 K/uL (ref 0.0–0.7)
Eosinophils Relative: 2.8 % (ref 0.0–5.0)
HCT: 32.7 % — ABNORMAL LOW (ref 36.0–46.0)
Hemoglobin: 10.1 g/dL — ABNORMAL LOW (ref 12.0–15.0)
Lymphocytes Relative: 41.2 % (ref 12.0–46.0)
Lymphs Abs: 2.3 K/uL (ref 0.7–4.0)
MCHC: 30.8 g/dL (ref 30.0–36.0)
MCV: 72.7 fl — ABNORMAL LOW (ref 78.0–100.0)
Monocytes Absolute: 0.4 K/uL (ref 0.1–1.0)
Monocytes Relative: 7.8 % (ref 3.0–12.0)
Neutro Abs: 2.7 K/uL (ref 1.4–7.7)
Neutrophils Relative %: 47.3 % (ref 43.0–77.0)
Platelets: 452 K/uL — ABNORMAL HIGH (ref 150.0–400.0)
RBC: 4.5 Mil/uL (ref 3.87–5.11)
RDW: 29.6 % — ABNORMAL HIGH (ref 11.5–15.5)
WBC: 5.6 K/uL (ref 4.0–10.5)

## 2023-12-16 NOTE — Progress Notes (Signed)
 321  Subjective:    Patient ID: Katherine Mcfarland, female    DOB: 1985/07/21, 38 y.o.   MRN: [id number]  Chief Complaint  Patient presents with   ER follow up    HPI Patient is in today for f/u er.   Discussed the use of AI scribe software for clinical note transcription with the patient, who gave verbal consent to proceed.  History of Present Illness Katherine Mcfarland is a 38 year old female with fibroids who presents with ongoing bleeding and recent hospitalization for a blood transfusion.  She has a history of fibroids and presents with ongoing bleeding. A few weeks ago, her fibroids were leaking, leading to a hospital visit where she was informed of her insurance lapse. Her insurance has since been reinstated as of August 1st. She has an upcoming appointment on August 12th to discuss surgical options, including a myomectomy.  She was recently admitted to [facility] due to significant bleeding and required a blood transfusion of one unit. She was discharged the same day after an ultrasound in the ER confirmed large fibroids. She continues to experience bleeding and is currently taking Megestrol , which she notes she must take consistently to prevent bleeding. She has not seen her OB since the hospital visit and is unsure about the continuation of Megestrol , but she has been taking it without a refill.  She feels tired all the time but is not experiencing cramping. She is taking iron  supplements, which cause stomach discomfort, and she tries to take them at night to mitigate this. She freezes the tablets to help with the side effects.  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 She is currently dealing with legal issues related to this incident. Despite these challenges, she continues to work with a  client who lives with her, providing some income stability. Additionally, she recently celebrated her daughter's sixteenth birthday by purchasing a car with her partner of eight years.    Past Medical History:  Diagnosis Date   Allergy    pt. reports seasonal allergies   Headache    Lactose intolerance    Palpitations    Right knee pain    Tinnitus     Past Surgical History:  Procedure Laterality Date   CESAREAN SECTION     WISDOM TOOTH EXTRACTION      Family History  Problem Relation Age of Onset   Diabetes Maternal Grandmother    Hypertension Maternal Grandmother    Hypertension Mother    Hyperlipidemia Mother    Hypertension Father     Social History   Socioeconomic History   Marital status: Single    Spouse name: Not on file   Number of children: Not on file   Years of education: Not on file   Highest education level: Not on file  Occupational History   Occupation: CMA    Employer: [employer]  Tobacco Use   Smoking status: Never   Smokeless tobacco: Never  Vaping Use   Vaping status: Never Used  Substance and Sexual Activity   Alcohol use: Yes    Comment: social   Drug use: No   Sexual activity: Yes    Birth control/protection: None  Other Topics Concern  Not on file  Social History Narrative   Not on file   Social Drivers of Health   Financial Resource Strain: Not on file  Food Insecurity: Not on file  Transportation Needs: Not on file  Physical Activity: Not on file  Stress: Not on file  Social Connections: Not on file  Intimate Partner Violence: Not on file    Outpatient Medications Prior to Visit  Medication Sig Dispense Refill   cyclobenzaprine  (FLEXERIL ) 5 MG tablet Take 1 tablet (5 mg total) by mouth 3 (three) times daily as needed for muscle spasms. 30 tablet 0   famotidine  (PEPCID ) 40 MG tablet Take 1 tablet (40 mg total) by mouth daily. 90 tablet 3   fluconazole  (DIFLUCAN ) 150 MG tablet Take 1 tablet by mouth once. May repeat in  3 days if needed. 12 tablet 3   HYDROcodone -acetaminophen  (NORCO/VICODIN) 5-325 MG tablet Take 1 tablet by mouth every 6 (six) hours as needed. 6 tablet 0   levocetirizine (XYZAL ) 5 MG tablet Take 1 tablet (5 mg total) by mouth 2 (two) times daily. 180 tablet 3   megestrol  (MEGACE ) 40 MG tablet Take 1 tablet (40 mg total) by mouth 2 (two) times daily. 60 tablet 0   metoprolol  succinate (TOPROL -XL) 50 MG 24 hr tablet Take 1 tablet (50 mg total) by mouth daily. Take with or immediately following a meal. 90 tablet 1   montelukast  (SINGULAIR ) 10 MG tablet Take 1 tablet (10 mg total) by mouth at bedtime. 90 tablet 3   Prenatal Vit-Fe Fumarate-FA (MULTIVITAMIN-PRENATAL) 27-0.8 MG TABS tablet Take 1 tablet by mouth daily. 90 tablet 3   traMADol  (ULTRAM ) 50 MG tablet Take 1-2 tablets (50-100 mg total) by mouth every 6 (six) hours as needed. 10 tablet 0   tretinoin  (RETIN-A ) 0.025 % cream Apply topically at bedtime. 45 g 3   Vitamin D , Ergocalciferol , (DRISDOL ) 1.25 MG (50000 UNIT) CAPS capsule Take 1 capsule (50,000 Units total) by mouth once a week. 12 capsule 2   No facility-administered medications prior to visit.    Allergies  Allergen Reactions   Metronidazole  Nausea And Vomiting   Mushroom Extract Complex (Obsolete) Diarrhea    Review of Systems  Constitutional:  Negative for fever and malaise/fatigue.  HENT:  Negative for congestion.   Eyes:  Negative for blurred vision.  Respiratory:  Negative for cough and shortness of breath.   Cardiovascular:  Negative for chest pain, palpitations and leg swelling.  Gastrointestinal:  Negative for abdominal pain, blood in stool, nausea and vomiting.  Genitourinary:  Negative for dysuria and frequency.  Musculoskeletal:  Negative for back pain and falls.  Skin:  Negative for rash.  Neurological:  Negative for dizziness, loss of consciousness and headaches.  Endo/Heme/Allergies:  Negative for environmental allergies.  Psychiatric/Behavioral:   Negative for depression. The patient is not nervous/anxious.        Objective:    Physical Exam Vitals and nursing note reviewed.  Constitutional:      General: She is not in acute distress.    Appearance: Normal appearance. She is well-developed.  HENT:     Head: Normocephalic and atraumatic.  Eyes:     General: No scleral icterus.       Right eye: No discharge.        Left eye: No discharge.  Cardiovascular:     Rate and Rhythm: Normal rate and regular rhythm.     Heart sounds: No murmur heard. Pulmonary:     Effort: Pulmonary effort is  normal. No respiratory distress.     Breath sounds: Normal breath sounds.  Genitourinary:    Comments: Vaginal bleeding con't Musculoskeletal:        General: Normal range of motion.     Cervical back: Normal range of motion and neck supple.     Right lower leg: No edema.     Left lower leg: No edema.  Skin:    General: Skin is warm and dry.  Neurological:     Mental Status: She is alert and oriented to person, place, and time.  Psychiatric:        Mood and Affect: Mood normal.        Behavior: Behavior normal.        Thought Content: Thought content normal.        Judgment: Judgment normal.     BP 120/86 (BP Location: Right Arm, Patient Position: Sitting, Cuff Size: Normal)   Pulse 84   Temp 98.4 F (36.9 C) (Oral)   Resp 18   Ht 5' 6 (1.676 m)   Wt 208 lb 3.2 oz (94.4 kg)   LMP 11/07/2023 (Exact Date)   SpO2 99%   BMI 33.60 kg/m  Wt Readings from Last 3 Encounters:  12/16/23 208 lb 3.2 oz (94.4 kg)  11/22/23 213 lb (96.6 kg)  11/21/23 213 lb 12.8 oz (97 kg)    Diabetic Foot Exam - Simple   No data filed    Lab Results  Component Value Date   WBC 5.6 12/16/2023   HGB 10.1 (L) 12/16/2023   HCT 32.7 (L) 12/16/2023   PLT 452.0 (H) 12/16/2023   GLUCOSE 88 11/22/2023   CHOL 163 07/22/2023   TRIG 84.0 07/22/2023   HDL 48.10 07/22/2023   LDLCALC 98 07/22/2023   ALT 10 07/22/2023   AST 13 07/22/2023   NA 135  11/22/2023   K 3.8 11/22/2023   CL 105 11/22/2023   CREATININE 0.71 11/22/2023   BUN 12 11/22/2023   CO2 22 11/22/2023   TSH 1.58 07/22/2023   HGBA1C 5.9 07/22/2023    Lab Results  Component Value Date   TSH 1.58 07/22/2023   Lab Results  Component Value Date   WBC 5.6 12/16/2023   HGB 10.1 (L) 12/16/2023   HCT 32.7 (L) 12/16/2023   MCV 72.7 (L) 12/16/2023   PLT 452.0 (H) 12/16/2023   Lab Results  Component Value Date   NA 135 11/22/2023   K 3.8 11/22/2023   CO2 22 11/22/2023   GLUCOSE 88 11/22/2023   BUN 12 11/22/2023   CREATININE 0.71 11/22/2023   BILITOT 0.2 07/22/2023   ALKPHOS 62 07/22/2023   AST 13 07/22/2023   ALT 10 07/22/2023   PROT 7.3 07/22/2023   ALBUMIN 4.3 07/22/2023   CALCIUM 8.6 (L) 11/22/2023   ANIONGAP 8 11/22/2023   GFR 92.41 07/22/2023   Lab Results  Component Value Date   CHOL 163 07/22/2023   Lab Results  Component Value Date   HDL 48.10 07/22/2023   Lab Results  Component Value Date   LDLCALC 98 07/22/2023   Lab Results  Component Value Date   TRIG 84.0 07/22/2023   Lab Results  Component Value Date   CHOLHDL 3 07/22/2023   Lab Results  Component Value Date   HGBA1C 5.9 07/22/2023       Assessment & Plan:  Iron  deficiency anemia, unspecified iron  deficiency anemia type -     CBC with Differential/Platelet -     Iron , TIBC  and Ferritin Panel  Assessment and Plan Assessment & Plan Uterine fibroids with abnormal uterine bleeding   Persistent uterine bleeding from large fibroids required recent hospitalization and blood transfusion. She is currently managed with Megestrol  and is awaiting a myomectomy scheduled for August 12. Continue Megestrol  until surgery. Attend the appointment with Dr. Henry on August 12 to discuss further treatment.    Iron  deficiency anemia secondary to uterine bleeding   Iron  deficiency anemia is due to ongoing uterine bleeding. A recent blood transfusion was administered. She reports stomach  discomfort with oral iron  supplementation. Check hemoglobin levels to assess current status. Consider iron  infusion if hemoglobin is low. Advise taking iron  tablets at night and storing them in the freezer to reduce stomach discomfort.    Eleena Grater R Lowne Chase, DO

## 2023-12-17 ENCOUNTER — Other Ambulatory Visit: Payer: Self-pay | Admitting: Family Medicine

## 2023-12-17 ENCOUNTER — Encounter: Payer: Self-pay | Admitting: Family Medicine

## 2023-12-17 DIAGNOSIS — M542 Cervicalgia: Secondary | ICD-10-CM

## 2023-12-17 LAB — IRON,TIBC AND FERRITIN PANEL
%SAT: 10 % — ABNORMAL LOW (ref 16–45)
Ferritin: 10 ng/mL — ABNORMAL LOW (ref 16–154)
Iron: 40 ug/dL (ref 40–190)
TIBC: 394 ug/dL (ref 250–450)

## 2023-12-18 NOTE — Telephone Encounter (Unsigned)
 Copied from CRM #[id number]. Topic: Referral - Question >> Dec 18, 2023 10:14 AM DeAngela L wrote: Reason for CRM: [facility] calling cause they did not receive the patients demographic information so they can schedule an appt  [facility]  fax [phone] phon [phone]

## 2023-12-19 NOTE — Telephone Encounter (Signed)
Demo faxed

## 2023-12-27 ENCOUNTER — Other Ambulatory Visit: Payer: Self-pay | Admitting: Family Medicine

## 2023-12-27 ENCOUNTER — Telehealth: Payer: Self-pay

## 2023-12-27 ENCOUNTER — Other Ambulatory Visit: Payer: Self-pay

## 2023-12-27 DIAGNOSIS — J302 Other seasonal allergic rhinitis: Secondary | ICD-10-CM

## 2023-12-27 DIAGNOSIS — N939 Abnormal uterine and vaginal bleeding, unspecified: Secondary | ICD-10-CM

## 2023-12-27 DIAGNOSIS — I10 Essential (primary) hypertension: Secondary | ICD-10-CM

## 2023-12-27 DIAGNOSIS — K219 Gastro-esophageal reflux disease without esophagitis: Secondary | ICD-10-CM

## 2023-12-27 DIAGNOSIS — E559 Vitamin D deficiency, unspecified: Secondary | ICD-10-CM

## 2023-12-27 MED ORDER — VITAMIN D (ERGOCALCIFEROL) 1.25 MG (50000 UNIT) PO CAPS: 50000.0000 [IU] | ORAL_CAPSULE | ORAL | 2 refills | Status: AC

## 2023-12-27 MED ORDER — LEVOCETIRIZINE DIHYDROCHLORIDE 5 MG PO TABS: 5.0000 mg | ORAL_TABLET | Freq: Two times a day (BID) | ORAL | 3 refills | Status: AC

## 2023-12-27 MED ORDER — METOPROLOL SUCCINATE ER 50 MG PO TB24: 50.0000 mg | ORAL_TABLET | Freq: Every day | ORAL | 1 refills | Status: DC

## 2023-12-27 MED ORDER — FAMOTIDINE 40 MG PO TABS: 40.0000 mg | ORAL_TABLET | Freq: Every day | ORAL | 3 refills | Status: AC

## 2023-12-27 MED ORDER — MONTELUKAST SODIUM 10 MG PO TABS
10.0000 mg | ORAL_TABLET | Freq: Every day | ORAL | 3 refills | Status: AC
Start: 2023-12-27 — End: ?

## 2023-12-27 MED ORDER — FLUCONAZOLE 150 MG PO TABS: ORAL_TABLET | ORAL | 3 refills | Status: AC

## 2023-12-27 NOTE — Telephone Encounter (Signed)
 Copied from CRM #[id number]. Topic: General - Other >> Dec 27, 2023  9:58 AM Lavanda D wrote: Reason for CRM: Patient is calling to speak with [facility] for referral, she would like one to an OBGYN - Attempted to assist patient with referral request but she did not want to speak with me, stated she just wants to talk to [facility].

## 2023-12-27 NOTE — Telephone Encounter (Signed)
 Spoke with patient. Pt states no longer needing referral. Was able to schedule with a gyn next week. Meds also refilled at request of patient

## 2024-01-29 ENCOUNTER — Encounter: Payer: Self-pay | Admitting: Family Medicine

## 2024-01-31 ENCOUNTER — Ambulatory Visit: Payer: Self-pay

## 2024-01-31 ENCOUNTER — Other Ambulatory Visit: Payer: Self-pay | Admitting: Family Medicine

## 2024-01-31 DIAGNOSIS — M26619 Adhesions and ankylosis of temporomandibular joint, unspecified side: Secondary | ICD-10-CM

## 2024-01-31 MED ORDER — TRAMADOL HCL 50 MG PO TABS: 50.0000 mg | ORAL_TABLET | Freq: Three times a day (TID) | ORAL | 0 refills | Status: AC | PRN

## 2024-01-31 NOTE — Telephone Encounter (Signed)
 FYI Only or Action Required?: Action required by provider: Med request.  Patient was last seen in primary care on 12/16/2023 by Antonio Meth, Jamee SAUNDERS, DO.  Called Nurse Triage reporting Vaginal Bleeding.  Symptoms began several weeks ago.  Interventions attempted: OTC medications: Ibuprofen .  Symptoms are: unchanged.  Triage Disposition: See PCP Within 2 Weeks  Patient/caregiver understands and will follow disposition?: Yes Reason for Disposition  All other vaginal symptoms  (Exceptions: Feels like prior yeast infection, minor abrasion, mild rash < 24 hour duration, mild itching, vaginal dryness during sex.)  Additional Information  Commented on: Vaginal pessary, questions about    Fibroid Pain / bleeding  Answer Assessment - Initial Assessment Questions Patient stated Ibuprofen  is not helping and is requesting PCP to send Tramadol  over to pharmacy. Patient stated she has tried to call GYN as well and has not been able to get through. Please advise.   1. SYMPTOM: What's the main symptom you're concerned about? (e.g., pain, itching, dryness)     Vaginal bleeding / Fibroid Pain  2. PAIN: Is there any pain? If Yes, ask: How bad is it? (Scale: 1-10; mild, moderate, severe)     Severe pain hard to function sometimes.  Protocols used: Vaginal Symptoms-A-AH    Copied from CRM [id number]. Topic: Clinical - Red Word Triage >> Jan 31, 2024  1:48 PM Martinique E wrote: [facility] that prompted transfer to Nurse Triage: Patient has fibroid pain, and vaginal bleeding, started Sept. 8th.

## 2024-02-04 ENCOUNTER — Other Ambulatory Visit: Payer: Self-pay | Admitting: Obstetrics and Gynecology

## 2024-02-17 ENCOUNTER — Other Ambulatory Visit: Payer: Self-pay | Admitting: Obstetrics and Gynecology

## 2024-02-18 ENCOUNTER — Encounter (HOSPITAL_COMMUNITY): Payer: Self-pay | Admitting: Obstetrics and Gynecology

## 2024-02-19 ENCOUNTER — Encounter (HOSPITAL_COMMUNITY): Payer: Self-pay | Admitting: Obstetrics and Gynecology

## 2024-02-19 NOTE — Pre-Procedure Instructions (Signed)
 Surgical Instructions  Your procedure is scheduled on :  Thursday,  02-27-2024 Report to [facility] Main Entrance A at 9:00 AM, then check in the Admitting office. Any questions or running late day of surgery :  call [phone]  Questions prior to your surgery day:  call [phone], Monday -- Friday 8am - 4pm. If you experience any cold or flu symptoms such as cough, fever, chills, shortness of breath, etc. between now and you scheduled surgery, please notify your surgeon office.   Remember: Do Not eat any food and Do Not drink any liquids after midnight the night before surgery.    This includes NO water,  candy,  gum, and mints.  Take these medicines the morning of surgery with A SIPS OF WATER:   Metoprolol  (toprol ) Norethindrone (gallifrey) Famotidine  (pepcid )  May take these medicines IF NEEDED:   hydrocodone   One week prior to surgery, STOP taking any Aspirin (unless otherwise instructed by your surgeon) Aleve, Naproxen, ibuprofen , Motrin , Advil , Goody's, BC's, all herbal medications/ supplements, fish oil, and non-prescription vitamins.  Do NOT Smoke (tobacco/ vaping) and Do Not drink alcohol for 24 hours prior to your procedure.  For those patients that use a CPAP.  Please bring your CPAP/ mask/ tubing with them day of surgery .  You will be asked to removed any contacts, glasses, piercing's, hearing aid's, dentures/ partials prior to surgery.  Please bring cases/ container/ solution/ etc., for them day of surgery.   Patients discharged the day of surgery will NOT be allowed to drive home.  You must have responsible driver and caregiver to stay at home with you the next 24 hours.  SURGICAL WAITING ROOM VISITATION Patients may have no more than 2 support people in the waiting area - if more than 2 , these visitors may rotate.  Pre-op nurse will coordinate an appropriate time for 1 Adult support person, who may not rotate, to accompany patient in pre-op.  Aware some patients  may have certain circumstances, speak to pre-op nurse day of surgery.  Children under the age 62 must have an adult with them who is not the patient and must remain in the main waiting area with an adult.  If the patient needs to stay at the hospital during part of their recovery, the visitor guidelines for inpatient rooms apply.  Please refer to the [facility] website for the visitor guidelines for any additional information.  If you received a COVID test during your pre-op visit it is requested that you wear a mask when out in public, stay away from anyone that may not be feeling well and notify your surgeon if you develop symptoms.  If you have been in contact with anyone that has tested positive in the past 10 days notify your surgeon.     [facility] - Preparing for Surgery  Before surgery, you can play an important role. Because skin is not sterile, it needs to be as free of germs as possible. You can reduce the number of germs on your skin by washing with CHG (chlorhexidine gluconate) soap before surgery. CHG is an antiseptic cleaner which kills germs and bonds with the skin to continue killing germs even after washing. Oral hygiene is also important in reducing the risk of infection. Remember to brush your teeth with your regular toothpaste the morning of surgery.  Please DO NOT use if you have an allergy to CHG or antibacterial soaps. If your skin becomes reddened/irritated stop using the CHG and inform  your Pre-op nurse Day of surgery.  DO NOT shave (including legs and genital area) for at least 48 hours prior to your CHG shower.   Please follow these instructions carefully:  Shower with CHG soap the night before surgery. If you choose to wash your hair, wash your hair first as usual with your normal shampoo. After you shampoo, rinse your hair and body thoroughly to remove the shampoo. Use CHG as you would any other liquid soap. You can apply CHG directly to the skin and wash  gently with a clean washcloth or shower sponge. Apply the CHG soap to your body ONLY FROM THE NECK DOWN. Do not use on open wounds or open sores. Avoid contact with your eyes, ears, mouth, and genitals (private parts). Wash genitals (private parts) with your normal soap. Wash thoroughly, paying special attention to the area where your surgery will be performed. Thoroughly rinse your body with warm water from the neck down. DO NOT shower/wash with your normal soap after using and rinsing off the CHG soap. DO NOT use lotions, oils, etc., after showering with CHG. Pat yourself dry with a clean towel. Wear clean pajamas. Place clean sheets on your bed the night of your CHG shower and do not sleep with pets.  Day of Surgery  DO NOT Apply any lotions,  powder,  oils,  deodorants (may use underarm deodorant),  cologne/  perfumes  or makeup Do Not wear jewelry /  piercing's/  metal/  permanent jewelry must be removed prior to arrival day of surgery. (No plastic piercing) Do Not wear nail polish,  gel polish,  artificial nails, or any other type of covering on natural finger nails (toe nails are okay) Remember to brush your teeth and rinse mouth out. Put on clean / comfortable clothes. [facility] is not responsible for valuables/ personal belongings

## 2024-02-19 NOTE — Progress Notes (Signed)
 Spoke w/ via phone for pre-op interview--- pt Lab needs dos----   upt      Lab results------ lab appt 02-26-2024 @ 1300 getting CBC/ T&S (needs second sign)/ BMP/ EKG per anes COVID test -----patient states asymptomatic no test needed Arrive at ------- 0900 on 02-27-2024 NPO after MN w/ exception sips of water w/ meds Pre-Surgery Ensure or G2: n/a  Med rec completed Medications to take morning of surgery ----- xyzal , toprol , pepcid , gallifrey Diabetic medication ----- n/a  GLP1 agonist last dose: n/a GLP1 instructions:  Patient instructed no nail polish to be worn day of surgery Patient instructed to bring photo id and insurance card day of surgery Patient aware to have Driver (ride ) / caregiver    for 24 hours after surgery - mother, carolyn alston Patient Special Instructions ----- will pick up soap and written instructions at lab appt Pre-Op special Instructions ----- advised pt to have professional to remove nose ring that she stated is unable to remove herself due to guideline permanent piercing must be removed prior to arrival day of surgery.  Pt verbalized understanding.  Patient verbalized understanding of instructions that were given at this phone interview. Patient denies chest pain, sob, fever, cough at the interview.

## 2024-02-21 ENCOUNTER — Other Ambulatory Visit: Payer: Self-pay | Admitting: Obstetrics and Gynecology

## 2024-02-25 LAB — SURGICAL PATHOLOGY

## 2024-02-26 ENCOUNTER — Inpatient Hospital Stay (HOSPITAL_COMMUNITY): Admission: RE | Admit: 2024-02-26 | Source: Ambulatory Visit

## 2024-02-26 ENCOUNTER — Encounter (HOSPITAL_COMMUNITY)
Admission: RE | Admit: 2024-02-26 | Discharge: 2024-02-26 | Disposition: A | Discharge status: Home / Self Care | Source: Ambulatory Visit | Attending: Obstetrics and Gynecology | Admitting: Obstetrics and Gynecology

## 2024-02-26 DIAGNOSIS — Z01818 Encounter for other preprocedural examination: Secondary | ICD-10-CM | POA: Insufficient documentation

## 2024-02-26 LAB — CBC
HCT: 28.6 % — ABNORMAL LOW (ref 36.0–46.0)
Hemoglobin: 8.7 g/dL — ABNORMAL LOW (ref 12.0–15.0)
MCH: 23.8 pg — ABNORMAL LOW (ref 26.0–34.0)
MCHC: 30.4 g/dL (ref 30.0–36.0)
MCV: 78.4 fL — ABNORMAL LOW (ref 80.0–100.0)
Platelets: 391 K/uL (ref 150–400)
RBC: 3.65 MIL/uL — ABNORMAL LOW (ref 3.87–5.11)
RDW: 18.6 % — ABNORMAL HIGH (ref 11.5–15.5)
WBC: 6.2 K/uL (ref 4.0–10.5)
nRBC: 0 % (ref 0.0–0.2)

## 2024-02-26 LAB — BASIC METABOLIC PANEL WITH GFR
Anion gap: 8 (ref 5–15)
BUN: 11 mg/dL (ref 6–20)
CO2: 21 mmol/L — ABNORMAL LOW (ref 22–32)
Calcium: 8.5 mg/dL — ABNORMAL LOW (ref 8.9–10.3)
Chloride: 106 mmol/L (ref 98–111)
Creatinine, Ser: 1.02 mg/dL — ABNORMAL HIGH (ref 0.44–1.00)
GFR, Estimated: >60 mL/min (ref >60)
Glucose, Bld: 136 mg/dL — ABNORMAL HIGH (ref 70–99)
Potassium: 3.7 mmol/L (ref 3.5–5.1)
Sodium: 135 mmol/L (ref 135–145)

## 2024-02-26 NOTE — Anesthesia Preprocedure Evaluation (Signed)
 Anesthesia Evaluation  Patient identified by MRN, date of birth, ID band Patient awake    Reviewed: Allergy & Precautions, H&P , NPO status , Patient's Chart, lab work & pertinent test results  Airway Mallampati: III  TM Distance: >3 FB Neck ROM: Full    Dental no notable dental hx. (+) Teeth Intact, Dental Advisory Given   Pulmonary neg pulmonary ROS   Pulmonary exam normal breath sounds clear to auscultation       Cardiovascular Exercise Tolerance: Good hypertension, Pt. on medications negative cardio ROS Normal cardiovascular exam Rhythm:Regular Rate:Normal     Neuro/Psych  Headaches PSYCHIATRIC DISORDERS Anxiety Depression    negative neurological ROS  negative psych ROS   GI/Hepatic negative GI ROS, Neg liver ROS,GERD  Medicated and Controlled,,  Endo/Other  negative endocrine ROSdiabetes    Renal/GU negative Renal ROS  negative genitourinary   Musculoskeletal negative musculoskeletal ROS (+)    Abdominal   Peds negative pediatric ROS (+)  Hematology negative hematology ROS (+) Blood dyscrasia, anemia   Anesthesia Other Findings   Reproductive/Obstetrics negative OB ROS                              Anesthesia Physical Anesthesia Plan  ASA: 2  Anesthesia Plan: General   Post-op Pain Management: Tylenol  PO (pre-op)* and Celebrex PO (pre-op)*   Induction: Intravenous  PONV Risk Score and Plan: 3 and Ondansetron  and Dexamethasone  Airway Management Planned: Oral ETT  Additional Equipment: None  Intra-op Plan:   Post-operative Plan: Extubation in OR  Informed Consent: I have reviewed the patients History and Physical, chart, labs and discussed the procedure including the risks, benefits and alternatives for the proposed anesthesia with the patient or authorized representative who has indicated his/her understanding and acceptance.       Plan Discussed with:  Anesthesiologist  Anesthesia Plan Comments: (  )         Anesthesia Quick Evaluation

## 2024-02-27 ENCOUNTER — Ambulatory Visit (HOSPITAL_COMMUNITY): Payer: Self-pay | Admitting: Anesthesiology

## 2024-02-27 ENCOUNTER — Encounter (HOSPITAL_COMMUNITY)
Admission: RE | Disposition: A | Discharge status: Home / Self Care | Payer: Self-pay | Source: Home / Self Care | Attending: Obstetrics and Gynecology

## 2024-02-27 ENCOUNTER — Inpatient Hospital Stay (HOSPITAL_COMMUNITY)
Admission: RE | Admit: 2024-02-27 | Discharge: 2024-03-01 | DRG: 742 | Disposition: A | Discharge status: Home / Self Care | Attending: Obstetrics and Gynecology | Admitting: Obstetrics and Gynecology

## 2024-02-27 ENCOUNTER — Other Ambulatory Visit: Payer: Self-pay

## 2024-02-27 ENCOUNTER — Encounter (HOSPITAL_COMMUNITY): Payer: Self-pay | Admitting: Obstetrics and Gynecology

## 2024-02-27 DIAGNOSIS — N83202 Unspecified ovarian cyst, left side: Secondary | ICD-10-CM | POA: Diagnosis present

## 2024-02-27 DIAGNOSIS — Z833 Family history of diabetes mellitus: Secondary | ICD-10-CM

## 2024-02-27 DIAGNOSIS — D252 Subserosal leiomyoma of uterus: Principal | ICD-10-CM | POA: Diagnosis present

## 2024-02-27 DIAGNOSIS — D62 Acute posthemorrhagic anemia: Secondary | ICD-10-CM | POA: Diagnosis present

## 2024-02-27 DIAGNOSIS — Z79899 Other long term (current) drug therapy: Secondary | ICD-10-CM

## 2024-02-27 DIAGNOSIS — Z8249 Family history of ischemic heart disease and other diseases of the circulatory system: Secondary | ICD-10-CM

## 2024-02-27 DIAGNOSIS — N390 Urinary tract infection, site not specified: Secondary | ICD-10-CM | POA: Diagnosis present

## 2024-02-27 DIAGNOSIS — Z9889 Other specified postprocedural states: Secondary | ICD-10-CM

## 2024-02-27 DIAGNOSIS — D219 Benign neoplasm of connective and other soft tissue, unspecified: Secondary | ICD-10-CM | POA: Diagnosis present

## 2024-02-27 DIAGNOSIS — E119 Type 2 diabetes mellitus without complications: Secondary | ICD-10-CM | POA: Diagnosis present

## 2024-02-27 DIAGNOSIS — K219 Gastro-esophageal reflux disease without esophagitis: Secondary | ICD-10-CM | POA: Diagnosis present

## 2024-02-27 DIAGNOSIS — I10 Essential (primary) hypertension: Secondary | ICD-10-CM | POA: Diagnosis present

## 2024-02-27 DIAGNOSIS — Z01818 Encounter for other preprocedural examination: Principal | ICD-10-CM

## 2024-02-27 HISTORY — DX: Type 2 diabetes mellitus without complications: E11.9

## 2024-02-27 HISTORY — PX: MYOMECTOMY: SHX85

## 2024-02-27 HISTORY — DX: Unspecified ovarian cyst, unspecified side: N83.209

## 2024-02-27 HISTORY — DX: Other seasonal allergic rhinitis: J30.2

## 2024-02-27 HISTORY — DX: Gastro-esophageal reflux disease without esophagitis: K21.9

## 2024-02-27 HISTORY — DX: Vitamin D deficiency, unspecified: E55.9

## 2024-02-27 HISTORY — DX: Headache, unspecified: R51.9

## 2024-02-27 HISTORY — DX: Iron deficiency anemia secondary to blood loss (chronic): D50.0

## 2024-02-27 HISTORY — DX: Leiomyoma of uterus, unspecified: D25.9

## 2024-02-27 HISTORY — PX: OVARIAN CYST REMOVAL: SHX89

## 2024-02-27 HISTORY — DX: Arthralgia of temporomandibular joint, unspecified side: M26.629

## 2024-02-27 LAB — CBC
HCT: 29 % — ABNORMAL LOW (ref 36.0–46.0)
Hemoglobin: 9.3 g/dL — ABNORMAL LOW (ref 12.0–15.0)
MCH: 25.3 pg — ABNORMAL LOW (ref 26.0–34.0)
MCHC: 32.1 g/dL (ref 30.0–36.0)
MCV: 78.8 fL — ABNORMAL LOW (ref 80.0–100.0)
Platelets: 373 K/uL (ref 150–400)
RBC: 3.68 MIL/uL — ABNORMAL LOW (ref 3.87–5.11)
RDW: 17.7 % — ABNORMAL HIGH (ref 11.5–15.5)
WBC: 18.8 K/uL — ABNORMAL HIGH (ref 4.0–10.5)
nRBC: 0 % (ref 0.0–0.2)

## 2024-02-27 LAB — GLUCOSE, CAPILLARY
Glucose-Capillary: 98 mg/dL (ref 70–99)
Glucose-Capillary: 93 mg/dL (ref 70–99)
Glucose-Capillary: 155 mg/dL — ABNORMAL HIGH (ref 70–99)

## 2024-02-27 LAB — PREPARE RBC (CROSSMATCH)

## 2024-02-27 LAB — POCT PREGNANCY, URINE: Preg Test, Ur: NEGATIVE

## 2024-02-27 LAB — TYPE AND SCREEN
ABO/RH(D): B POS
Antibody Screen: NEGATIVE

## 2024-02-27 SURGERY — MYOMECTOMY, ABDOMINAL APPROACH
Anesthesia: General

## 2024-02-27 MED ORDER — VASOPRESSIN 20 UNIT/ML IV SOLN
INTRAVENOUS | Status: AC
  Filled 2024-02-27: qty 1

## 2024-02-27 MED ORDER — ONDANSETRON HCL 4 MG/2ML IJ SOLN
INTRAMUSCULAR | Status: DC | PRN
  Administered 2024-02-27: 4 mg via INTRAVENOUS

## 2024-02-27 MED ORDER — OXYCODONE HCL 5 MG PO TABS
5.0000 mg | ORAL_TABLET | ORAL | Status: DC | PRN
  Administered 2024-02-28 (×3): 10 mg via ORAL
  Filled 2024-02-27 (×3): qty 2

## 2024-02-27 MED ORDER — FENTANYL CITRATE (PF) 250 MCG/5ML IJ SOLN
INTRAMUSCULAR | Status: AC
  Filled 2024-02-27: qty 5

## 2024-02-27 MED ORDER — OXYCODONE HCL 5 MG PO TABS
5.0000 mg | ORAL_TABLET | Freq: Once | ORAL | Status: AC | PRN
  Administered 2024-02-27: 5 mg via ORAL

## 2024-02-27 MED ORDER — PROPOFOL 10 MG/ML IV BOLUS
INTRAVENOUS | Status: DC | PRN
  Administered 2024-02-27: 150 mg via INTRAVENOUS

## 2024-02-27 MED ORDER — 0.9 % SODIUM CHLORIDE (POUR BTL) OPTIME
TOPICAL | Status: DC | PRN
  Administered 2024-02-27 (×2): 1000 mL

## 2024-02-27 MED ORDER — POVIDONE-IODINE 10 % EX SWAB: 2.0000 | Freq: Once | CUTANEOUS | Status: DC

## 2024-02-27 MED ORDER — NALOXONE HCL 0.4 MG/ML IJ SOLN: 0.4000 mg | INTRAMUSCULAR | Status: DC | PRN

## 2024-02-27 MED ORDER — CEFAZOLIN SODIUM-DEXTROSE 2-4 GM/100ML-% IV SOLN
2.0000 g | INTRAVENOUS | Status: AC
  Administered 2024-02-27: 2 g via INTRAVENOUS

## 2024-02-27 MED ORDER — TRANEXAMIC ACID-NACL 1000-0.7 MG/100ML-% IV SOLN
INTRAVENOUS | Status: AC
  Filled 2024-02-27: qty 100

## 2024-02-27 MED ORDER — SODIUM CHLORIDE 0.9% FLUSH: 9.0000 mL | INTRAVENOUS | Status: DC | PRN

## 2024-02-27 MED ORDER — KETOROLAC TROMETHAMINE 30 MG/ML IJ SOLN
INTRAMUSCULAR | Status: AC
  Filled 2024-02-27: qty 1

## 2024-02-27 MED ORDER — SENNOSIDES-DOCUSATE SODIUM 8.6-50 MG PO TABS: 2.0000 | ORAL_TABLET | Freq: Every day | ORAL | Status: DC

## 2024-02-27 MED ORDER — ZOLPIDEM TARTRATE 5 MG PO TABS: 5.0000 mg | ORAL_TABLET | Freq: Every evening | ORAL | Status: DC | PRN

## 2024-02-27 MED ORDER — ONDANSETRON HCL 4 MG/2ML IJ SOLN: 4.0000 mg | Freq: Four times a day (QID) | INTRAMUSCULAR | Status: DC | PRN

## 2024-02-27 MED ORDER — SODIUM CHLORIDE 0.9 % IV SOLN: 3.0000 g | Freq: Four times a day (QID) | INTRAVENOUS | Status: DC

## 2024-02-27 MED ORDER — LIDOCAINE 2% (20 MG/ML) 5 ML SYRINGE
INTRAMUSCULAR | Status: DC | PRN
  Administered 2024-02-27: 100 mg via INTRAVENOUS

## 2024-02-27 MED ORDER — SODIUM CHLORIDE (PF) 0.9 % IJ SOLN
PREFILLED_SYRINGE | INTRAVENOUS | Status: DC | PRN
  Administered 2024-02-27: 15 mL via SURGICAL_CAVITY

## 2024-02-27 MED ORDER — PRENATAL MULTIVITAMIN CH
1.0000 | ORAL_TABLET | Freq: Every day | ORAL | Status: DC
Start: 2024-02-28 — End: 2024-02-27

## 2024-02-27 MED ORDER — KETOROLAC TROMETHAMINE 30 MG/ML IJ SOLN
INTRAMUSCULAR | Status: DC | PRN
  Administered 2024-02-27: 30 mg via INTRAVENOUS

## 2024-02-27 MED ORDER — LABETALOL HCL 5 MG/ML IV SOLN
10.0000 mg | Freq: Once | INTRAVENOUS | Status: AC
  Administered 2024-02-27: 10 mg via INTRAVENOUS
  Filled 2024-02-27: qty 4

## 2024-02-27 MED ORDER — ACETAMINOPHEN 500 MG PO TABS
1000.0000 mg | ORAL_TABLET | Freq: Once | ORAL | Status: AC
  Administered 2024-02-27: 1000 mg via ORAL

## 2024-02-27 MED ORDER — COCONUT OIL OIL: 1.0000 | TOPICAL_OIL | Status: DC | PRN

## 2024-02-27 MED ORDER — DIPHENHYDRAMINE HCL 12.5 MG/5ML PO ELIX: 12.5000 mg | ORAL_SOLUTION | Freq: Four times a day (QID) | ORAL | Status: DC | PRN

## 2024-02-27 MED ORDER — MENTHOL 3 MG MT LOZG: 1.0000 | LOZENGE | OROMUCOSAL | Status: DC | PRN

## 2024-02-27 MED ORDER — FENTANYL CITRATE (PF) 100 MCG/2ML IJ SOLN
25.0000 ug | INTRAMUSCULAR | Status: DC | PRN
  Administered 2024-02-27 (×2): 50 ug via INTRAVENOUS

## 2024-02-27 MED ORDER — CEFAZOLIN SODIUM-DEXTROSE 2-4 GM/100ML-% IV SOLN
INTRAVENOUS | Status: AC
Start: 2024-02-27 — End: 2024-02-27
  Filled 2024-02-27: qty 100

## 2024-02-27 MED ORDER — BUPIVACAINE-EPINEPHRINE (PF) 0.25% -1:200000 IJ SOLN
INTRAMUSCULAR | Status: DC | PRN
  Administered 2024-02-27 (×2): 25 mL

## 2024-02-27 MED ORDER — MEPERIDINE HCL 25 MG/ML IJ SOLN: 6.2500 mg | INTRAMUSCULAR | Status: DC | PRN

## 2024-02-27 MED ORDER — FENTANYL CITRATE (PF) 100 MCG/2ML IJ SOLN: 50.0000 ug | INTRAMUSCULAR | Status: DC | PRN

## 2024-02-27 MED ORDER — SODIUM CHLORIDE (PF) 0.9 % IJ SOLN
INTRAMUSCULAR | Status: AC
Start: 2024-02-27 — End: 2024-02-27
  Filled 2024-02-27: qty 100

## 2024-02-27 MED ORDER — LACTATED RINGERS IV SOLN: INTRAVENOUS | Status: AC

## 2024-02-27 MED ORDER — ROCURONIUM BROMIDE 10 MG/ML (PF) SYRINGE
PREFILLED_SYRINGE | INTRAVENOUS | Status: AC
  Filled 2024-02-27: qty 10

## 2024-02-27 MED ORDER — PROPOFOL 10 MG/ML IV BOLUS
INTRAVENOUS | Status: AC
  Filled 2024-02-27: qty 20

## 2024-02-27 MED ORDER — PHENYLEPHRINE 80 MCG/ML (10ML) SYRINGE FOR IV PUSH (FOR BLOOD PRESSURE SUPPORT)
PREFILLED_SYRINGE | INTRAVENOUS | Status: AC
  Filled 2024-02-27: qty 10

## 2024-02-27 MED ORDER — TRANEXAMIC ACID-NACL 1000-0.7 MG/100ML-% IV SOLN
INTRAVENOUS | Status: DC | PRN
Start: 2024-02-27 — End: 2024-02-27
  Administered 2024-02-27 (×2): 1000 mg via INTRAVENOUS

## 2024-02-27 MED ORDER — LIDOCAINE 2% (20 MG/ML) 5 ML SYRINGE
INTRAMUSCULAR | Status: AC
  Filled 2024-02-27: qty 5

## 2024-02-27 MED ORDER — HYDROMORPHONE 1 MG/ML IV SOLN
INTRAVENOUS | Status: DC
  Administered 2024-02-27: 30 mg via INTRAVENOUS
  Filled 2024-02-27: qty 30

## 2024-02-27 MED ORDER — OXYTOCIN-SODIUM CHLORIDE 30-0.9 UT/500ML-% IV SOLN: 2.5000 [IU]/h | INTRAVENOUS | Status: DC

## 2024-02-27 MED ORDER — HYDROMORPHONE 1 MG/ML IV SOLN: INTRAVENOUS | Status: DC

## 2024-02-27 MED ORDER — MIDAZOLAM HCL (PF) 2 MG/2ML IJ SOLN
INTRAMUSCULAR | Status: DC | PRN
  Administered 2024-02-27: 2 mg via INTRAVENOUS

## 2024-02-27 MED ORDER — MIDAZOLAM HCL 2 MG/2ML IJ SOLN
INTRAMUSCULAR | Status: AC
  Filled 2024-02-27: qty 2

## 2024-02-27 MED ORDER — CHLORHEXIDINE GLUCONATE 0.12 % MT SOLN
OROMUCOSAL | Status: AC
  Filled 2024-02-27: qty 15

## 2024-02-27 MED ORDER — KETOROLAC TROMETHAMINE 30 MG/ML IJ SOLN
30.0000 mg | Freq: Four times a day (QID) | INTRAMUSCULAR | Status: AC
  Administered 2024-02-27 – 2024-02-28 (×4): 30 mg via INTRAVENOUS
  Filled 2024-02-27 (×4): qty 1

## 2024-02-27 MED ORDER — ONDANSETRON HCL 4 MG/2ML IJ SOLN
INTRAMUSCULAR | Status: AC
  Filled 2024-02-27: qty 2

## 2024-02-27 MED ORDER — SIMETHICONE 80 MG PO CHEW: 80.0000 mg | CHEWABLE_TABLET | Freq: Three times a day (TID) | ORAL | Status: DC

## 2024-02-27 MED ORDER — ACETAMINOPHEN 500 MG PO TABS
1000.0000 mg | ORAL_TABLET | Freq: Four times a day (QID) | ORAL | Status: DC
  Administered 2024-02-27 – 2024-02-28 (×5): 1000 mg via ORAL
  Filled 2024-02-27 (×5): qty 2

## 2024-02-27 MED ORDER — LACTATED RINGERS IV SOLN: INTRAVENOUS | Status: DC

## 2024-02-27 MED ORDER — PHENYLEPHRINE 80 MCG/ML (10ML) SYRINGE FOR IV PUSH (FOR BLOOD PRESSURE SUPPORT)
PREFILLED_SYRINGE | INTRAVENOUS | Status: DC | PRN
  Administered 2024-02-27: 40 ug via INTRAVENOUS

## 2024-02-27 MED ORDER — KETOROLAC TROMETHAMINE 30 MG/ML IJ SOLN: 30.0000 mg | Freq: Four times a day (QID) | INTRAMUSCULAR | Status: DC

## 2024-02-27 MED ORDER — DIPHENHYDRAMINE HCL 50 MG/ML IJ SOLN: 12.5000 mg | Freq: Four times a day (QID) | INTRAMUSCULAR | Status: DC | PRN

## 2024-02-27 MED ORDER — IBUPROFEN 600 MG PO TABS: 600.0000 mg | ORAL_TABLET | Freq: Four times a day (QID) | ORAL | Status: DC

## 2024-02-27 MED ORDER — CELECOXIB 200 MG PO CAPS
ORAL_CAPSULE | ORAL | Status: AC
  Filled 2024-02-27: qty 1

## 2024-02-27 MED ORDER — OXYCODONE HCL 5 MG PO TABS: 5.0000 mg | ORAL_TABLET | ORAL | Status: DC | PRN

## 2024-02-27 MED ORDER — OXYCODONE HCL 5 MG/5ML PO SOLN: 5.0000 mg | Freq: Once | ORAL | Status: AC | PRN

## 2024-02-27 MED ORDER — MISOPROSTOL 100 MCG PO TABS
ORAL_TABLET | ORAL | Status: DC | PRN
  Administered 2024-02-27: 400 ug via RECTAL

## 2024-02-27 MED ORDER — ONDANSETRON HCL 4 MG/2ML IJ SOLN
4.0000 mg | Freq: Four times a day (QID) | INTRAMUSCULAR | Status: DC | PRN
  Administered 2024-02-27: 4 mg via INTRAVENOUS
  Filled 2024-02-27: qty 2

## 2024-02-27 MED ORDER — GLYCOPYRROLATE PF 0.2 MG/ML IJ SOSY
PREFILLED_SYRINGE | INTRAMUSCULAR | Status: AC
  Filled 2024-02-27: qty 1

## 2024-02-27 MED ORDER — SIMETHICONE 80 MG PO CHEW: 80.0000 mg | CHEWABLE_TABLET | ORAL | Status: DC | PRN

## 2024-02-27 MED ORDER — FENTANYL CITRATE (PF) 250 MCG/5ML IJ SOLN
INTRAMUSCULAR | Status: DC | PRN
  Administered 2024-02-27 (×5): 50 ug via INTRAVENOUS

## 2024-02-27 MED ORDER — ONDANSETRON HCL 4 MG/2ML IJ SOLN: 4.0000 mg | Freq: Once | INTRAMUSCULAR | Status: DC | PRN

## 2024-02-27 MED ORDER — SODIUM CHLORIDE 0.9 % IV SOLN: INTRAVENOUS | Status: DC | PRN

## 2024-02-27 MED ORDER — FENTANYL CITRATE (PF) 100 MCG/2ML IJ SOLN
INTRAMUSCULAR | Status: AC
  Filled 2024-02-27: qty 2

## 2024-02-27 MED ORDER — VASOPRESSIN 20 UNIT/ML IV SOLN
INTRAVENOUS | Status: DC | PRN
  Administered 2024-02-27: 14 mL via INTRAMUSCULAR

## 2024-02-27 MED ORDER — BUPIVACAINE LIPOSOME 1.3 % IJ SUSP
INTRAMUSCULAR | Status: DC | PRN
  Administered 2024-02-27 (×2): 5 mL

## 2024-02-27 MED ORDER — DIPHENHYDRAMINE HCL 25 MG PO CAPS: 25.0000 mg | ORAL_CAPSULE | Freq: Four times a day (QID) | ORAL | Status: DC | PRN

## 2024-02-27 MED ORDER — ORAL CARE MOUTH RINSE: 15.0000 mL | Freq: Once | OROMUCOSAL | Status: AC

## 2024-02-27 MED ORDER — IBUPROFEN 600 MG PO TABS
600.0000 mg | ORAL_TABLET | Freq: Four times a day (QID) | ORAL | Status: DC
  Administered 2024-02-28 – 2024-03-01 (×7): 600 mg via ORAL
  Filled 2024-02-27 (×7): qty 1

## 2024-02-27 MED ORDER — ACETAMINOPHEN 500 MG PO TABS
ORAL_TABLET | ORAL | Status: AC
Start: 2024-02-27 — End: 2024-02-27
  Filled 2024-02-27: qty 2

## 2024-02-27 MED ORDER — CELECOXIB 200 MG PO CAPS
200.0000 mg | ORAL_CAPSULE | Freq: Once | ORAL | Status: AC
  Administered 2024-02-27: 200 mg via ORAL

## 2024-02-27 MED ORDER — METOPROLOL SUCCINATE ER 50 MG PO TB24
50.0000 mg | ORAL_TABLET | Freq: Every day | ORAL | Status: DC
  Administered 2024-02-28 – 2024-03-01 (×3): 50 mg via ORAL
  Filled 2024-02-27 (×3): qty 1

## 2024-02-27 MED ORDER — DEXAMETHASONE SOD PHOSPHATE PF 10 MG/ML IJ SOLN
INTRAMUSCULAR | Status: DC | PRN
  Administered 2024-02-27: 10 mg via INTRAVENOUS

## 2024-02-27 MED ORDER — OXYCODONE HCL 5 MG PO TABS
ORAL_TABLET | ORAL | Status: AC
  Filled 2024-02-27: qty 1

## 2024-02-27 MED ORDER — METHYLENE BLUE 20 MG/2ML IV SOSY
PREFILLED_SYRINGE | INTRAVENOUS | Status: AC
  Filled 2024-02-27: qty 2

## 2024-02-27 MED ORDER — MISOPROSTOL 200 MCG PO TABS
ORAL_TABLET | ORAL | Status: AC
  Filled 2024-02-27: qty 2

## 2024-02-27 MED ORDER — ALBUMIN HUMAN 5 % IV SOLN: INTRAVENOUS | Status: DC | PRN

## 2024-02-27 MED ORDER — CHLORHEXIDINE GLUCONATE 0.12 % MT SOLN
15.0000 mL | Freq: Once | OROMUCOSAL | Status: AC
  Administered 2024-02-27: 15 mL via OROMUCOSAL

## 2024-02-27 MED ORDER — ONDANSETRON HCL 4 MG/2ML IJ SOLN
4.0000 mg | Freq: Four times a day (QID) | INTRAMUSCULAR | Status: DC | PRN
  Administered 2024-02-28 (×2): 4 mg via INTRAVENOUS
  Filled 2024-02-27 (×2): qty 2

## 2024-02-27 MED ORDER — SODIUM CHLORIDE 0.9 % IV SOLN: 10.0000 mL/h | Freq: Once | INTRAVENOUS | Status: DC

## 2024-02-27 MED ORDER — ACETAMINOPHEN 500 MG PO TABS: 1000.0000 mg | ORAL_TABLET | Freq: Four times a day (QID) | ORAL | Status: DC

## 2024-02-27 MED ORDER — ROCURONIUM BROMIDE 10 MG/ML (PF) SYRINGE
PREFILLED_SYRINGE | INTRAVENOUS | Status: DC | PRN
  Administered 2024-02-27: 70 mg via INTRAVENOUS

## 2024-02-27 SURGICAL SUPPLY — 44 items
BAG COUNTER SPONGE SURGICOUNT (BAG) ×1 IMPLANT
BARRIER ADHS 3X4 INTERCEED (GAUZE/BANDAGES/DRESSINGS) IMPLANT
BENZOIN TINCTURE PRP APPL 2/3 (GAUZE/BANDAGES/DRESSINGS) IMPLANT
CATH FOLEY 2WAY SLVR 5CC 20FR (CATHETERS) IMPLANT
COVER MAYO STAND STRL (DRAPES) ×1 IMPLANT
DRAPE CESAREAN BIRTH W POUCH (DRAPES) ×1 IMPLANT
DRAPE SURG IRRIG POUCH 19X23 (DRAPES) ×1 IMPLANT
DRSG OPSITE POSTOP 4X10 (GAUZE/BANDAGES/DRESSINGS) ×1 IMPLANT
DURAPREP 26ML APPLICATOR (WOUND CARE) ×1 IMPLANT
GAUZE SPONGE 4X4 16PLY XRAY LF (GAUZE/BANDAGES/DRESSINGS) IMPLANT
GLOVE BIO SURGEON STRL SZ7.5 (GLOVE) ×1 IMPLANT
GLOVE BIOGEL PI IND STRL 7.0 (GLOVE) ×2 IMPLANT
GLOVE BIOGEL PI IND STRL 7.5 (GLOVE) ×1 IMPLANT
GOWN STRL REUS W/ TWL LRG LVL3 (GOWN DISPOSABLE) ×3 IMPLANT
HEMOSTAT SURGICEL 2X14 (HEMOSTASIS) IMPLANT
KIT TURNOVER KIT B (KITS) ×1 IMPLANT
NDL HYPO 22X1.5 SAFETY MO (MISCELLANEOUS) ×1 IMPLANT
NEEDLE HYPO 22X1.5 SAFETY MO (MISCELLANEOUS) ×1 IMPLANT
NS IRRIG 1000ML POUR BTL (IV SOLUTION) IMPLANT
PACK ABDOMINAL GYN (CUSTOM PROCEDURE TRAY) ×1 IMPLANT
PAD ARMBOARD POSITIONER FOAM (MISCELLANEOUS) ×1 IMPLANT
PAD OB MATERNITY 11 LF (PERSONAL CARE ITEMS) ×1 IMPLANT
PENCIL SMOKE EVACUATOR (MISCELLANEOUS) ×1 IMPLANT
RTRCTR C-SECT PINK 25CM LRG (MISCELLANEOUS) IMPLANT
SHEET LAVH (DRAPES) IMPLANT
SPONGE LAP 18X18 X RAY DECT (DISPOSABLE) IMPLANT
SPONGE SURGIFOAM ABS GEL 12-7 (HEMOSTASIS) IMPLANT
SPONGE T-LAP 18X18 ~~LOC~~+RFID (SPONGE) ×2 IMPLANT
STAPLER SKIN PROX 35W (STAPLE) IMPLANT
STRIP CLOSURE SKIN 1/2X4 (GAUZE/BANDAGES/DRESSINGS) ×1 IMPLANT
SUT CHROMIC 2 0 CT 1 (SUTURE) ×1 IMPLANT
SUT MNCRL AB 3-0 PS2 27 (SUTURE) IMPLANT
SUT PDS AB 1 CTX 36 (SUTURE) IMPLANT
SUT VIC AB 0 CT1 18XCR BRD8 (SUTURE) IMPLANT
SUT VIC AB 0 CT1 27XBRD ANBCTR (SUTURE) ×4 IMPLANT
SUT VIC AB 0 CT1 36 (SUTURE) IMPLANT
SUT VIC AB 0 CTX36XBRD ANBCTRL (SUTURE) IMPLANT
SUT VIC AB 2-0 CT1 TAPERPNT 27 (SUTURE) IMPLANT
SUT VIC AB 2-0 SH 27XBRD (SUTURE) IMPLANT
SUT VIC AB 3-0 SH 27X BRD (SUTURE) ×2 IMPLANT
SUTURE PLAIN GUT 2.0 ETHICON (SUTURE) IMPLANT
SYR CONTROL 10ML LL (SYRINGE) ×1 IMPLANT
TOWEL GREEN STERILE FF (TOWEL DISPOSABLE) ×2 IMPLANT
TRAY FOLEY W/BAG SLVR 14FR (SET/KITS/TRAYS/PACK) ×1 IMPLANT

## 2024-02-27 NOTE — Transfer of Care (Signed)
 Immediate Anesthesia Transfer of Care Note  Patient: Katherine Mcfarland  Procedure(s) Performed: MYOMECTOMY, ABDOMINAL APPROACH LEFT OVARIAN EXCISION OF MASS  Patient Location: PACU  Anesthesia Type:General  Level of Consciousness: awake, alert , oriented, and patient cooperative  Airway & Oxygen Therapy: Patient Spontanous Breathing and Patient connected to face mask oxygen  Post-op Assessment: Report given to RN, Post -op Vital signs reviewed and stable, and Patient moving all extremities X 4  Post vital signs: Reviewed and stable  Last Vitals:  Vitals Value Taken Time  BP 149/83 02/27/24 15:30  Temp 36   Pulse 99 02/27/24 15:30  Resp 19 02/27/24 15:32  SpO2 100 % 02/27/24 15:30  Vitals shown include unfiled device data.  Last Pain:  Vitals:   02/27/24 0859  TempSrc: Oral  PainSc: 4       Patients Stated Pain Goal: 4 (02/27/24 0859)  Complications: No notable events documented.

## 2024-02-27 NOTE — Anesthesia Procedure Notes (Signed)
 Procedure Name: Intubation Date/Time: 02/27/2024 12:58 PM  Performed by: Viviana Almarie DASEN, CRNAPre-anesthesia Checklist: Patient identified, Emergency Drugs available, Suction available and Patient being monitored Patient Re-evaluated:Patient Re-evaluated prior to induction Oxygen Delivery Method: Circle System Utilized Preoxygenation: Pre-oxygenation with 100% oxygen Induction Type: IV induction Ventilation: Mask ventilation without difficulty Tube type: Oral Tube size: 7.0 mm Number of attempts: 1 Airway Equipment and Method: Stylet Placement Confirmation: ETT inserted through vocal cords under direct vision, positive ETCO2 and breath sounds checked- equal and bilateral Secured at: 21 cm Tube secured with: Tape Dental Injury: Teeth and Oropharynx as per pre-operative assessment

## 2024-02-27 NOTE — Anesthesia Procedure Notes (Signed)
 Anesthesia Regional Block: TAP block   Pre-Anesthetic Checklist: , timeout performed,  Correct Patient, Correct Site, Correct Laterality,  Correct Procedure, Correct Position, site marked,  Risks and benefits discussed,  Surgical consent,  Pre-op evaluation,  At surgeon's request and post-op pain management  Laterality: Left and Right  Prep: chloraprep       Needles:  Injection technique: Single-shot  Needle Type: Echogenic Stimulator Needle     Needle Length: 5cm  Needle Gauge: 22     Additional Needles:   Procedures:, nerve stimulator,,, ultrasound used (permanent image in chart),,    Narrative:  Start time: 02/27/2024 3:00 PM End time: 02/27/2024 3:20 PM Injection made incrementally with aspirations every 5 mL.  Performed by: Personally  Anesthesiologist: Mallory Manus, MD  Additional Notes: Functioning IV was confirmed and monitors were applied.  A 50mm 22ga Arrow echogenic stimulator needle was used. Sterile prep and drape,hand hygiene and sterile gloves were used. Ultrasound guidance: relevant anatomy identified, needle position confirmed, local anesthetic spread visualized around nerve(s)., vascular puncture avoided.  Image printed for medical record. Negative aspiration and negative test dose prior to incremental administration of local anesthetic. The patient tolerated the procedure well.  NO PICK

## 2024-02-27 NOTE — Op Note (Addendum)
 Preop Diagnosis: 1.Symptomatic Fibroids 2.Ovarian Cyst  Postop Diagnosis: 1.Symptomatic Fibroids 2.Ovarian Cyst  Procedure:  1.Abdominal Myomectomy 2.Left Ovarian Excision of Mass PR MYOMECTOMY 1-4 MYOMAS W/250 GM/< ABDOMINAL APPR [58140] PR OVARIAN CYSTECTOMY UNI/BI [[id number]]   Anesthesia: General   Anesthesiologist: No responsible provider has been recorded for the case.   Attending: Henry Slough, MD   Assistant: Ovid All, MD  Findings: 8 fibroids largest about 8cm and next one about 6cm  Pathology: 1.Fibroids 2.Left Ovarian Mass  Fluids: 1100 cc 250 cc albumin 1u PRBCs intra-op d/t acute blood loss and low starting hgb  UOP:  50 cc  EBL:  450 cc  Complications: None  Procedure: The patient was taken to the operating room after the risks, benefits and alternatives were discussed with the patient. The patient verbalized understanding and consent signed and witnessed. The patient was placed under general anesthesia per anesthesiologist and prepped and draped in the normal sterile fashion.  Time Out was performed per protocol.  A pfannenstiel skin incision was made and carried down to the underlying fascia.  The fascial incision was extended bilaterally with the Mayo scissors.  The inferior aspect of the fascial incision was grasped with Kocher clamps and the fascia excised from the rectus muscle.  The same was done on the superior aspect of the fascial incision.  The muscle was separated in the midline and the peritoneum entered bluntly and extended manually.  An Alexis retractor was placed and the bowel packed away with 2 moist laparotomy sponges.  A towel clamp was placed on the fundal fibroid and the uterus elevated up through the incision.  Vasopressin at a concentration of 20 units of vasopressin in 50cc of normal saline was injected into the fundal fibroid and the fibroid shelled out using a hemostat and bovie for cautery.  The same was done for the remaining  fibroids.  The uterus was sounded and although cavity was not entered it was abutting the cavity.  All beds were repaired with 2-0 vicryl and the serosa was reapproximated with 3-0 vicryl.  Copious irrigation was performed.  The left ovary was elevated with a babcock and incised over mass which was well circumscribed and smooth and removed without difficulty.  The ovarian bed was cauterized, intercede was placed in the bed and on all incisions.  One incision was used extending from the fundus down anteriorly to the LUS.  Two large fibroids were in the uterine wall, one was at the fundus and the other in the anterior lower uterine segment.  Multiple other smaller fibroids were removed through this lincision as well and two small serosal fibroids removed from the posterior wall.  The endometrial cavity was not entered as mentioned above.  Laparotomy sponges were removed and the peritoneum repaired with 2-0 chromic after removing Alexis as well.  The fascia was repaired with 0 vicryl.  The subcutaneous tissue was irrigated and made hemostatic with the bovie and then reapproximated with 2-0 plain.  The skin was reapproximated with 3-0 monocryl and bezoin and steristrips applied.  Honeycomb dressing was placed as well.  An assistant was required d/t lthe complexity of the anatomy.

## 2024-02-27 NOTE — H&P (Signed)
 Katherine Mcfarland is an 38 y.o. female. Pt known to me with symptomatic multiple fibroids.  Cycles are heavy prolonged and    Pertinent Gynecological History: Menses: h/o regular cycles but irregular and prolonged recently.  Takes gallifrey.  Last mammogram: normal Date: 2024 Last pap: normal Date: 04/2023 OB History: G4, P3104   Menstrual History: Patient's last menstrual period was 01/18/2024 (approximate).    Past Medical History:  Diagnosis Date   Diet-controlled type 2 diabetes mellitus (HCC)    followed by pcp   (02-19-2024 pt stated does not check blood sugar at home)   Generalized headaches    GERD (gastroesophageal reflux disease)    Iron  deficiency anemia due to chronic blood loss    followed by pcp;   ED visit 11-21-2023  Hg 6.5 due to AUB, transfused x1 PRBCs and oral iron    Lactose intolerance    Ovarian cyst    PSVT (paroxysmal supraventricular tachycardia) 2017   cardiology evaluation by dr jeffrie (lov in epic 07-11-2016);  event monitor 02-29-2016, NST/ ST/ SVT (ordered by pcp, dr antonio);   echo 08-15-2016 normal put on toprol    Seasonal allergies    pt. reports seasonal allergies   Uterine fibroid    Vitamin D  deficiency     Past Surgical History:  Procedure Laterality Date   CESAREAN SECTION  03/13/2005   @[facility]  by Dr WENDI Na   COLONOSCOPY  2005   WISDOM TOOTH EXTRACTION      Family History  Problem Relation Age of Onset   Diabetes Maternal Grandmother    Hypertension Maternal Grandmother    Hypertension Mother    Hyperlipidemia Mother    Hypertension Father     Social History:  reports that she has never smoked. She has never used smokeless tobacco. She reports current alcohol use. She reports that she does not use drugs.  Allergies:  Allergies  Allergen Reactions   Metronidazole  Nausea And Vomiting   Mushroom Extract Complex (Obsolete) Diarrhea    Medications Prior to Admission  Medication Sig Dispense Refill Last Dose/Taking    acetaminophen  (TYLENOL ) 650 MG CR tablet Take 1,300 mg by mouth every 8 (eight) hours as needed for pain.   Past Week   famotidine  (PEPCID ) 40 MG tablet Take 1 tablet (40 mg total) by mouth daily. (Patient taking differently: Take 40 mg by mouth daily.) 90 tablet 3 02/27/2024 at  7:30 AM   ferrous sulfate 325 (65 FE) MG tablet Take 325 mg by mouth daily.   02/26/2024   ibuprofen  (ADVIL ) 200 MG tablet Take 200 mg by mouth every 6 (six) hours as needed.   02/26/2024   levocetirizine (XYZAL ) 5 MG tablet Take 1 tablet (5 mg total) by mouth 2 (two) times daily. (Patient taking differently: Take 5 mg by mouth daily.) 180 tablet 3 02/26/2024   metoprolol  succinate (TOPROL -XL) 50 MG 24 hr tablet Take 1 tablet (50 mg total) by mouth daily. Take with or immediately following a meal. 90 tablet 1 02/27/2024 at  7:30 AM   montelukast  (SINGULAIR ) 10 MG tablet Take 1 tablet (10 mg total) by mouth at bedtime. 90 tablet 3 02/26/2024   norethindrone (GALLIFREY) 5 MG tablet Take 2 tablets by mouth daily.   02/27/2024 at  7:30 AM   traMADol  (ULTRAM ) 50 MG tablet Take 50 mg by mouth every 6 (six) hours as needed.   Past Week   Vitamin D , Ergocalciferol , (DRISDOL ) 1.25 MG (50000 UNIT) CAPS capsule Take 1 capsule (50,000 Units total) by  mouth once a week. (Patient taking differently: Take 50,000 Units by mouth once a week. Friday's) 12 capsule 2 Past Week   fluconazole  (DIFLUCAN ) 150 MG tablet Take 1 tablet by mouth once. May repeat in 3 days if needed. (Patient taking differently: Take 150 mg by mouth as needed. Take 1 tablet by mouth once. May repeat in 3 days if needed.) 12 tablet 3 More than a month   HYDROcodone -acetaminophen  (NORCO/VICODIN) 5-325 MG tablet Take 1 tablet by mouth every 6 (six) hours as needed. 6 tablet 0 More than a month    Review of Systems No F/C/N/V/D  Blood pressure (!) 164/91, pulse 84, temperature 98 F (36.7 C), temperature source Oral, resp. rate 16, height 5' 6 (1.676 m), weight 98 kg,  last menstrual period 01/18/2024, SpO2 95%. Physical Exam Lungs unlabored breathing\ CV RRR Abdomen soft, NT Extremities no calf tenderness  Results for orders placed or performed during the hospital encounter of 02/27/24 (from the past 24 hours)  Pregnancy, urine POC     Status: None   Collection Time: 02/27/24  8:52 AM  Result Value Ref Range   Preg Test, Ur NEGATIVE NEGATIVE  Type and screen     Status: None   Collection Time: 02/27/24  9:30 AM  Result Value Ref Range   ABO/RH(D) B POS    Antibody Screen NEG    Sample Expiration      03/01/2024,2359 Performed at [facility], [address] [place], [place] [address]   Glucose, capillary     Status: None   Collection Time: 02/27/24  9:30 AM  Result Value Ref Range   Glucose-Capillary 98 70 - 99 mg/dL   Neg embx 89/89/74  05/7972 ultrasound uterus 15.7cm, large 7.2 and 6 cm fibroids, left solid appearing ovarian 1.7cm cyst and no rt ovary  No results found.  Assessment/Plan: 57bn [id number] with symptomatic fibroids desires to preserve fertility although recognizes the likelihood of fertility is low.  She also has a left ovarian solid appearing cyst on ultrasound.  Pt presents for scheduled abdominal myomectomy and possible cystectomy.  Risks benefits and alternatives reviewed with the patient including but not limited to bleeding infection and injury and pt is agreeable to a blood transfusion if indicated.   Jon CINDERELLA Rummer 02/27/2024, 11:58 AM

## 2024-02-27 NOTE — Anesthesia Postprocedure Evaluation (Signed)
 Anesthesia Post Note  Patient: Katherine Mcfarland  Procedure(s) Performed: MYOMECTOMY, ABDOMINAL APPROACH LEFT OVARIAN EXCISION OF MASS     Patient location during evaluation: PACU Anesthesia Type: General Level of consciousness: awake and alert Pain management: pain level controlled Vital Signs Assessment: post-procedure vital signs reviewed and stable Respiratory status: spontaneous breathing, nonlabored ventilation, respiratory function stable and patient connected to nasal cannula oxygen Cardiovascular status: blood pressure returned to baseline and stable Postop Assessment: no apparent nausea or vomiting Anesthetic complications: no   No notable events documented.  Last Vitals:  Vitals:   02/27/24 0859 02/27/24 1530  BP: (!) 164/91 (!) 149/83  Pulse: 84 99  Resp: 16 19  Temp: 36.7 C 37 C  SpO2: 95% 100%    Last Pain:  Vitals:   02/27/24 1530  TempSrc:   PainSc: Asleep   Pain Goal: Patients Stated Pain Goal: 4 (02/27/24 0859)                 Katherine Mcfarland

## 2024-02-28 ENCOUNTER — Encounter (HOSPITAL_COMMUNITY): Payer: Self-pay | Admitting: Obstetrics and Gynecology

## 2024-02-28 DIAGNOSIS — N390 Urinary tract infection, site not specified: Secondary | ICD-10-CM | POA: Diagnosis present

## 2024-02-28 DIAGNOSIS — K219 Gastro-esophageal reflux disease without esophagitis: Secondary | ICD-10-CM | POA: Diagnosis present

## 2024-02-28 DIAGNOSIS — Z833 Family history of diabetes mellitus: Secondary | ICD-10-CM | POA: Diagnosis not present

## 2024-02-28 DIAGNOSIS — D259 Leiomyoma of uterus, unspecified: Secondary | ICD-10-CM | POA: Diagnosis present

## 2024-02-28 DIAGNOSIS — D252 Subserosal leiomyoma of uterus: Secondary | ICD-10-CM | POA: Diagnosis present

## 2024-02-28 DIAGNOSIS — E119 Type 2 diabetes mellitus without complications: Secondary | ICD-10-CM | POA: Diagnosis present

## 2024-02-28 DIAGNOSIS — D62 Acute posthemorrhagic anemia: Secondary | ICD-10-CM | POA: Diagnosis present

## 2024-02-28 DIAGNOSIS — Z79899 Other long term (current) drug therapy: Secondary | ICD-10-CM | POA: Diagnosis not present

## 2024-02-28 DIAGNOSIS — I10 Essential (primary) hypertension: Secondary | ICD-10-CM | POA: Diagnosis present

## 2024-02-28 DIAGNOSIS — N83202 Unspecified ovarian cyst, left side: Secondary | ICD-10-CM | POA: Diagnosis present

## 2024-02-28 DIAGNOSIS — Z8249 Family history of ischemic heart disease and other diseases of the circulatory system: Secondary | ICD-10-CM | POA: Diagnosis not present

## 2024-02-28 LAB — TYPE AND SCREEN
ABO/RH(D): B POS
Antibody Screen: NEGATIVE
Unit division: 0

## 2024-02-28 LAB — URINALYSIS, ROUTINE W REFLEX MICROSCOPIC
Bilirubin Urine: NEGATIVE
Glucose, UA: NEGATIVE mg/dL
Ketones, ur: NEGATIVE mg/dL
Nitrite: NEGATIVE
Protein, ur: NEGATIVE mg/dL
Specific Gravity, Urine: 1.008 (ref 1.005–1.030)
pH: 6 (ref 5.0–8.0)

## 2024-02-28 LAB — BASIC METABOLIC PANEL WITH GFR
Anion gap: 10 (ref 5–15)
BUN: 8 mg/dL (ref 6–20)
CO2: 20 mmol/L — ABNORMAL LOW (ref 22–32)
Calcium: 8.1 mg/dL — ABNORMAL LOW (ref 8.9–10.3)
Chloride: 101 mmol/L (ref 98–111)
Creatinine, Ser: 0.95 mg/dL (ref 0.44–1.00)
GFR, Estimated: >60 mL/min (ref >60)
Glucose, Bld: 178 mg/dL — ABNORMAL HIGH (ref 70–99)
Potassium: 4.3 mmol/L (ref 3.5–5.1)
Sodium: 131 mmol/L — ABNORMAL LOW (ref 135–145)

## 2024-02-28 LAB — CBC
HCT: 27.1 % — ABNORMAL LOW (ref 36.0–46.0)
Hemoglobin: 8.6 g/dL — ABNORMAL LOW (ref 12.0–15.0)
MCH: 25.1 pg — ABNORMAL LOW (ref 26.0–34.0)
MCHC: 31.7 g/dL (ref 30.0–36.0)
MCV: 79 fL — ABNORMAL LOW (ref 80.0–100.0)
Platelets: 368 K/uL (ref 150–400)
RBC: 3.43 MIL/uL — ABNORMAL LOW (ref 3.87–5.11)
RDW: 17.8 % — ABNORMAL HIGH (ref 11.5–15.5)
WBC: 11.9 K/uL — ABNORMAL HIGH (ref 4.0–10.5)
nRBC: 0 % (ref 0.0–0.2)

## 2024-02-28 LAB — GLUCOSE, CAPILLARY
Glucose-Capillary: 209 mg/dL — ABNORMAL HIGH (ref 70–99)
Glucose-Capillary: 143 mg/dL — ABNORMAL HIGH (ref 70–99)

## 2024-02-28 LAB — BPAM RBC
Blood Product Expiration Date: 202511112359
ISSUE DATE / TIME: 202510161419
Unit Type and Rh: 7300

## 2024-02-28 MED ORDER — FAMOTIDINE 20 MG PO TABS
20.0000 mg | ORAL_TABLET | Freq: Two times a day (BID) | ORAL | Status: DC
  Administered 2024-02-28 – 2024-03-01 (×4): 20 mg via ORAL
  Filled 2024-02-28 (×4): qty 1

## 2024-02-28 MED ORDER — ONDANSETRON HCL 4 MG/2ML IJ SOLN
4.0000 mg | Freq: Four times a day (QID) | INTRAMUSCULAR | Status: DC | PRN
  Administered 2024-02-28: 4 mg via INTRAVENOUS
  Filled 2024-02-28: qty 2

## 2024-02-28 MED ORDER — HYDROCODONE-ACETAMINOPHEN 5-325 MG PO TABS
2.0000 | ORAL_TABLET | Freq: Four times a day (QID) | ORAL | Status: DC | PRN
  Administered 2024-02-29 – 2024-03-01 (×6): 2 via ORAL
  Filled 2024-02-28 (×7): qty 2

## 2024-02-28 MED ORDER — GABAPENTIN 300 MG PO CAPS
300.0000 mg | ORAL_CAPSULE | Freq: Three times a day (TID) | ORAL | Status: DC
  Administered 2024-02-28 – 2024-02-29 (×2): 300 mg via ORAL
  Filled 2024-02-28 (×3): qty 1

## 2024-02-28 NOTE — Progress Notes (Signed)
 1 Day Post-Op Procedure(s) (LRB): MYOMECTOMY, ABDOMINAL APPROACH (N/A) LEFT OVARIAN EXCISION OF MASS (N/A)  Subjective: Patient reports emesis this morning with breakfast controlled with zofran .  A little nausea with lunch but no emesis.  Slight burning with urination but normal amount.  Pain adequately controlled.    Objective: I have reviewed patient's vital signs and intake and output.  General: alert, cooperative, and no distress Resp: unlabored breathing Cardio: regular rate and rhythm Extremities: extremities normal, atraumatic, no cyanosis or edema and no edema, redness or tenderness in the calves or thighs Vaginal Bleeding: minimal Dressing c/d/i Abdomen soft, app tender  Assessment: s/p Procedure(s): MYOMECTOMY, ABDOMINAL APPROACH (N/A) LEFT OVARIAN EXCISION OF MASS (N/A): stable, progressing well, and anemia  Plan: CBGs are elevated.  Pt has been controlling CBGs at home with diet.  Will change to a carb modified diet. Dysuria - send ua and cx Asymptomatic anemia d/t pre-existing anemia and surgical blood loss Cont routine post op care SCDs for DVT prophylaxis  LOS: 1 day    Jon CINDERELLA Rummer, MD 02/28/2024, 4:30 PM

## 2024-02-29 LAB — GLUCOSE, CAPILLARY
Glucose-Capillary: 114 mg/dL — ABNORMAL HIGH (ref 70–99)
Glucose-Capillary: 121 mg/dL — ABNORMAL HIGH (ref 70–99)
Glucose-Capillary: 128 mg/dL — ABNORMAL HIGH (ref 70–99)
Glucose-Capillary: 131 mg/dL — ABNORMAL HIGH (ref 70–99)

## 2024-02-29 MED ORDER — SIMETHICONE 80 MG PO CHEW
80.0000 mg | CHEWABLE_TABLET | Freq: Once | ORAL | Status: AC
  Administered 2024-02-29: 80 mg via ORAL
  Filled 2024-02-29: qty 1

## 2024-02-29 MED ORDER — CYCLOBENZAPRINE HCL 10 MG PO TABS
10.0000 mg | ORAL_TABLET | Freq: Three times a day (TID) | ORAL | Status: DC | PRN
  Administered 2024-02-29 – 2024-03-01 (×2): 10 mg via ORAL
  Filled 2024-02-29 (×2): qty 1

## 2024-02-29 MED ORDER — SULFAMETHOXAZOLE-TRIMETHOPRIM 800-160 MG PO TABS
1.0000 | ORAL_TABLET | Freq: Two times a day (BID) | ORAL | Status: DC
Start: 2024-02-29 — End: 2024-03-01
  Administered 2024-02-29 – 2024-03-01 (×2): 1 via ORAL
  Filled 2024-02-29 (×3): qty 1

## 2024-02-29 MED ORDER — MAGNESIUM OXIDE -MG SUPPLEMENT 400 (240 MG) MG PO TABS
400.0000 mg | ORAL_TABLET | Freq: Every day | ORAL | Status: DC
Start: 2024-02-29 — End: 2024-03-01
  Administered 2024-02-29 – 2024-03-01 (×2): 400 mg via ORAL
  Filled 2024-02-29 (×2): qty 1

## 2024-02-29 NOTE — Progress Notes (Signed)
 2 Days Post-Op Procedure(s) (LRB): MYOMECTOMY, ABDOMINAL APPROACH (N/A) LEFT OVARIAN EXCISION OF MASS (N/A)  Subjective: Patient reports incisional pain, tolerating PO, and no problems voiding.    Objective: I have reviewed patient's vital signs, intake and output, and labs.  General: alert, cooperative, and no distress Resp: unlabored breathing Cardio: regular rate and rhythm GI: soft, app tender Extremities: no calf tenderness Vaginal Bleeding: minimal Dressing c/d/i  Assessment: s/p Procedure(s): MYOMECTOMY, ABDOMINAL APPROACH (N/A) LEFT OVARIAN EXCISION OF MASS (N/A): Stable but still having some challenges with pain control.  Plan: Encourage ambulation Cont routine post op care Pain medication adjusted Plan for discharge tomorrow SCDs for DVT prophylaxis  LOS: 2 days    Jon CINDERELLA Rummer, MD 02/29/2024, 3:48 PM

## 2024-03-01 LAB — URINE CULTURE: Culture: >=100000 — AB

## 2024-03-01 LAB — GLUCOSE, CAPILLARY
Glucose-Capillary: 98 mg/dL (ref 70–99)
Glucose-Capillary: 117 mg/dL — ABNORMAL HIGH (ref 70–99)

## 2024-03-01 MED ORDER — ONDANSETRON 4 MG PO TBDP: 4.0000 mg | ORAL_TABLET | Freq: Three times a day (TID) | ORAL | 0 refills | Status: AC | PRN

## 2024-03-01 MED ORDER — HYDROCODONE-ACETAMINOPHEN 5-325 MG PO TABS: 2.0000 | ORAL_TABLET | Freq: Four times a day (QID) | ORAL | 0 refills | Status: AC | PRN

## 2024-03-01 MED ORDER — IBUPROFEN 600 MG PO TABS: 600.0000 mg | ORAL_TABLET | Freq: Four times a day (QID) | ORAL | 0 refills | Status: AC

## 2024-03-01 MED ORDER — SULFAMETHOXAZOLE-TRIMETHOPRIM 800-160 MG PO TABS: 1.0000 | ORAL_TABLET | Freq: Two times a day (BID) | ORAL | 0 refills | Status: AC

## 2024-03-01 NOTE — Discharge Summary (Signed)
 Physician Discharge Summary  Patient ID: Katherine Mcfarland MRN: [id number] DOB/AGE: 02-01-1986 38 y.o.  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   Pt was anemic preop and with the acute blood loss during surgery, pt received a unit of PRBCs.  She was found to have a UTI on POD 2 and was prescribed bactrim DS.  She was passing flatus, tolerating po with no emesis but had occas nausea well controlled with zofran .  She was feeling much better on POD 3 and ready to go home.  Discharge instructions reviewed and questions answered.  Cont metoprolol  for CHTN and carb modified diet for T2DM although pt says she does not have it just prediabetes.  Pt will restart gallifrey at home.  Consults: None  Significant Diagnostic Studies: n/a  Treatments: antibiotics: bactrim DS for UTI  Discharge Exam: Blood pressure (!) 144/77, pulse 77, temperature 98.4 F (36.9 C), temperature source Oral, resp. rate 18, height 5' 6 (1.676 m), weight 98 kg, last menstrual period 01/18/2024, SpO2 98%. General appearance: alert, cooperative, and no distress Resp: unlabored breathing Cardio: regular rate and rhythm GI: soft, minimal tenderness Extremities: no calf tenderness Incision/Wound: Honeycomb dressing c/d/I (instructions given) Moderate bleeding  Disposition: Discharge disposition: 01-Home or Self Care        Allergies as of 03/01/2024       Reactions   Metronidazole  Nausea And Vomiting   Mushroom Extract Complex (obsolete) Diarrhea        Medication List     STOP taking these medications    acetaminophen  650 MG CR tablet Commonly known as: TYLENOL        TAKE these medications     famotidine  40 MG tablet Commonly known as: PEPCID  Take 1 tablet (40 mg total) by mouth daily.   ferrous sulfate 325 (65 FE) MG tablet Take 325 mg by mouth daily.   fluconazole  150 MG tablet Commonly known as: DIFLUCAN  Take 1 tablet by mouth once. May repeat in 3 days if needed. What changed:  how much to take how to take this when to take this reasons to take this   Gallifrey 5 MG tablet Generic drug: norethindrone Take 2 tablets by mouth daily.   HYDROcodone -acetaminophen  5-325 MG tablet Commonly known as: NORCO/VICODIN Take 2 tablets by mouth every 6 (six) hours as needed for moderate pain (pain score 4-6) or severe pain (pain score 7-10). What changed:  how much to take reasons to take this   ibuprofen  600 MG tablet Commonly known as: ADVIL  Take 1 tablet (600 mg total) by mouth every 6 (six) hours. What changed:  medication strength how much to take when to take this reasons to take this   levocetirizine 5 MG tablet Commonly known as: XYZAL  Take 1 tablet (5 mg total) by mouth 2 (two) times daily. What changed: when to take this   metoprolol  succinate 50 MG 24 hr tablet Commonly known as: TOPROL -XL Take 1 tablet (50 mg total) by mouth daily. Take with or immediately following a meal.   montelukast  10 MG tablet Commonly known as: SINGULAIR  Take 1 tablet (10 mg total) by mouth at bedtime.   ondansetron  4 MG disintegrating tablet Commonly known as: ZOFRAN -ODT Take 1 tablet (4 mg total)  by mouth every 8 (eight) hours as needed for nausea or vomiting.   sulfamethoxazole-trimethoprim 800-160 MG tablet Commonly known as: BACTRIM DS Take 1 tablet by mouth every 12 (twelve) hours for 6 days.   traMADol  50 MG tablet Commonly known as: ULTRAM  Take 50 mg by mouth every 6 (six) hours as needed.   Vitamin D  (Ergocalciferol ) 1.25 MG (50000 UNIT) Caps capsule Commonly known as: DRISDOL  Take 1 capsule (50,000 Units total) by mouth once a week. What changed:  additional instructions        Follow-up Information     Henry Slough, MD. Go on 04/08/2024.   Specialty: Obstetrics and Gynecology Why: at 11:00am for post op appointment Contact information: [address] [place] [address] [phone]                 Signed: Slough [name] 03/01/2024, 2:54 PM

## 2024-03-02 LAB — SURGICAL PATHOLOGY

## 2024-03-04 ENCOUNTER — Encounter: Payer: Self-pay | Admitting: Family Medicine

## 2024-03-04 ENCOUNTER — Other Ambulatory Visit: Payer: Self-pay | Admitting: Family Medicine

## 2024-03-04 DIAGNOSIS — I10 Essential (primary) hypertension: Secondary | ICD-10-CM

## 2024-03-04 MED ORDER — METOPROLOL SUCCINATE ER 100 MG PO TB24: 100.0000 mg | ORAL_TABLET | Freq: Every day | ORAL | 3 refills | Status: AC

## 2024-03-23 ENCOUNTER — Encounter (HOSPITAL_COMMUNITY)
Admission: RE | Disposition: A | Discharge status: Home / Self Care | Payer: Self-pay | Source: Home / Self Care | Attending: Obstetrics and Gynecology

## 2024-03-23 SURGERY — MYOMECTOMY, ABDOMINAL APPROACH
Anesthesia: Choice | Laterality: Bilateral

## 2024-07-23 ENCOUNTER — Encounter: Admitting: Family Medicine
